# Patient Record
Sex: Male | Born: 1945 | Race: White | Hispanic: Refuse to answer | Marital: Married | Smoking: Current every day smoker
Health system: Southern US, Community
[De-identification: ages and names within clinical notes are randomized; demographics above are authoritative.]

## PROBLEM LIST (undated history)

## (undated) DIAGNOSIS — I4891 Unspecified atrial fibrillation: Secondary | ICD-10-CM

## (undated) DIAGNOSIS — H269 Unspecified cataract: Secondary | ICD-10-CM

## (undated) DIAGNOSIS — I10 Essential (primary) hypertension: Secondary | ICD-10-CM

## (undated) DIAGNOSIS — T7840XA Allergy, unspecified, initial encounter: Secondary | ICD-10-CM

## (undated) DIAGNOSIS — F1910 Other psychoactive substance abuse, uncomplicated: Secondary | ICD-10-CM

## (undated) DIAGNOSIS — I2699 Other pulmonary embolism without acute cor pulmonale: Secondary | ICD-10-CM

## (undated) DIAGNOSIS — Z860100 Personal history of colon polyps, unspecified: Secondary | ICD-10-CM

## (undated) DIAGNOSIS — C801 Malignant (primary) neoplasm, unspecified: Secondary | ICD-10-CM

## (undated) DIAGNOSIS — I82409 Acute embolism and thrombosis of unspecified deep veins of unspecified lower extremity: Secondary | ICD-10-CM

## (undated) DIAGNOSIS — F419 Anxiety disorder, unspecified: Secondary | ICD-10-CM

## (undated) DIAGNOSIS — F32A Depression, unspecified: Secondary | ICD-10-CM

## (undated) HISTORY — PX: TONSILLECTOMY: SUR1361

## (undated) HISTORY — DX: Personal history of colon polyps, unspecified: Z86.0100

## (undated) HISTORY — DX: Unspecified cataract: H26.9

## (undated) HISTORY — DX: Malignant (primary) neoplasm, unspecified: C80.1

## (undated) HISTORY — DX: Anxiety disorder, unspecified: F41.9

## (undated) HISTORY — PX: OTHER SURGICAL HISTORY: SHX169

## (undated) HISTORY — DX: Allergy, unspecified, initial encounter: T78.40XA

## (undated) HISTORY — DX: Acute embolism and thrombosis of unspecified deep veins of unspecified lower extremity: I82.409

## (undated) HISTORY — DX: Other psychoactive substance abuse, uncomplicated: F19.10

## (undated) HISTORY — PX: EYE SURGERY: SHX253

## (undated) HISTORY — PX: PROSTATECTOMY: SHX69

## (undated) HISTORY — DX: Personal history of colonic polyps: Z86.010

## (undated) HISTORY — DX: Depression, unspecified: F32.A

---

## 2005-12-26 DIAGNOSIS — Z86718 Personal history of other venous thrombosis and embolism: Secondary | ICD-10-CM | POA: Insufficient documentation

## 2012-07-05 DIAGNOSIS — M549 Dorsalgia, unspecified: Secondary | ICD-10-CM | POA: Insufficient documentation

## 2012-08-02 DIAGNOSIS — G629 Polyneuropathy, unspecified: Secondary | ICD-10-CM | POA: Insufficient documentation

## 2012-08-02 DIAGNOSIS — Z8546 Personal history of malignant neoplasm of prostate: Secondary | ICD-10-CM | POA: Insufficient documentation

## 2012-08-02 HISTORY — DX: Polyneuropathy, unspecified: G62.9

## 2015-03-03 LAB — HM HEPATITIS C SCREENING LAB: HM Hepatitis Screen: NEGATIVE

## 2017-06-06 LAB — HM COLONOSCOPY

## 2019-06-03 DIAGNOSIS — Z7901 Long term (current) use of anticoagulants: Secondary | ICD-10-CM | POA: Insufficient documentation

## 2019-07-02 DIAGNOSIS — F17200 Nicotine dependence, unspecified, uncomplicated: Secondary | ICD-10-CM

## 2019-07-02 HISTORY — DX: Nicotine dependence, unspecified, uncomplicated: F17.200

## 2019-07-22 DIAGNOSIS — Z7901 Long term (current) use of anticoagulants: Secondary | ICD-10-CM

## 2019-07-22 DIAGNOSIS — Z5181 Encounter for therapeutic drug level monitoring: Secondary | ICD-10-CM | POA: Insufficient documentation

## 2019-07-22 HISTORY — DX: Encounter for therapeutic drug level monitoring: Z79.01

## 2019-07-22 HISTORY — DX: Encounter for therapeutic drug level monitoring: Z51.81

## 2019-10-24 DIAGNOSIS — G4709 Other insomnia: Secondary | ICD-10-CM | POA: Insufficient documentation

## 2020-01-08 DIAGNOSIS — N200 Calculus of kidney: Secondary | ICD-10-CM | POA: Insufficient documentation

## 2020-01-08 HISTORY — DX: Calculus of kidney: N20.0

## 2020-09-22 DIAGNOSIS — I4891 Unspecified atrial fibrillation: Secondary | ICD-10-CM | POA: Insufficient documentation

## 2020-09-23 DIAGNOSIS — I4811 Longstanding persistent atrial fibrillation: Secondary | ICD-10-CM | POA: Insufficient documentation

## 2020-11-26 ENCOUNTER — Emergency Department (HOSPITAL_COMMUNITY)
Admission: EM | Admit: 2020-11-26 | Discharge: 2020-11-27 | Disposition: A | Discharge status: Home / Self Care | Payer: Medicare Other | Attending: Emergency Medicine | Admitting: Emergency Medicine

## 2020-11-26 ENCOUNTER — Encounter (HOSPITAL_COMMUNITY): Payer: Self-pay | Admitting: Emergency Medicine

## 2020-11-26 ENCOUNTER — Other Ambulatory Visit: Payer: Self-pay

## 2020-11-26 DIAGNOSIS — Z7901 Long term (current) use of anticoagulants: Secondary | ICD-10-CM | POA: Diagnosis not present

## 2020-11-26 DIAGNOSIS — Z86711 Personal history of pulmonary embolism: Secondary | ICD-10-CM | POA: Insufficient documentation

## 2020-11-26 DIAGNOSIS — R791 Abnormal coagulation profile: Secondary | ICD-10-CM | POA: Insufficient documentation

## 2020-11-26 DIAGNOSIS — I4891 Unspecified atrial fibrillation: Secondary | ICD-10-CM | POA: Diagnosis not present

## 2020-11-26 DIAGNOSIS — I10 Essential (primary) hypertension: Secondary | ICD-10-CM | POA: Diagnosis not present

## 2020-11-26 HISTORY — DX: Other pulmonary embolism without acute cor pulmonale: I26.99

## 2020-11-26 HISTORY — DX: Essential (primary) hypertension: I10

## 2020-11-26 HISTORY — DX: Unspecified atrial fibrillation: I48.91

## 2020-11-26 LAB — CBC WITH DIFFERENTIAL/PLATELET
Abs Immature Granulocytes: 0.06 10*3/uL (ref 0.00–0.07)
Basophils Absolute: 0.1 10*3/uL (ref 0.0–0.1)
Basophils Relative: 1 %
Eosinophils Absolute: 0.6 10*3/uL — ABNORMAL HIGH (ref 0.0–0.5)
Eosinophils Relative: 8 %
HCT: 43.5 % (ref 39.0–52.0)
Hemoglobin: 14.7 g/dL (ref 13.0–17.0)
Immature Granulocytes: 1 %
Lymphocytes Relative: 25 %
Lymphs Abs: 2.1 10*3/uL (ref 0.7–4.0)
MCH: 32 pg (ref 26.0–34.0)
MCHC: 33.8 g/dL (ref 30.0–36.0)
MCV: 94.6 fL (ref 80.0–100.0)
Monocytes Absolute: 1.3 10*3/uL — ABNORMAL HIGH (ref 0.1–1.0)
Monocytes Relative: 15 %
Neutro Abs: 4.3 10*3/uL (ref 1.7–7.7)
Neutrophils Relative %: 50 %
Platelets: 195 10*3/uL (ref 150–400)
RBC: 4.6 MIL/uL (ref 4.22–5.81)
RDW: 14.1 % (ref 11.5–15.5)
WBC: 8.4 10*3/uL (ref 4.0–10.5)
nRBC: 0 % (ref 0.0–0.2)

## 2020-11-26 LAB — COMPREHENSIVE METABOLIC PANEL
ALT: 23 U/L (ref 0–44)
AST: 25 U/L (ref 15–41)
Albumin: 3.8 g/dL (ref 3.5–5.0)
Alkaline Phosphatase: 52 U/L (ref 38–126)
Anion gap: 10 (ref 5–15)
BUN: 15 mg/dL (ref 8–23)
CO2: 24 mmol/L (ref 22–32)
Calcium: 9.2 mg/dL (ref 8.9–10.3)
Chloride: 104 mmol/L (ref 98–111)
Creatinine, Ser: 1.05 mg/dL (ref 0.61–1.24)
GFR, Estimated: >60 mL/min (ref >60)
Glucose, Bld: 84 mg/dL (ref 70–99)
Potassium: 4.6 mmol/L (ref 3.5–5.1)
Sodium: 138 mmol/L (ref 135–145)
Total Bilirubin: 0.6 mg/dL (ref 0.3–1.2)
Total Protein: 6.7 g/dL (ref 6.5–8.1)

## 2020-11-26 LAB — PROTIME-INR
INR: 1.1 (ref 0.8–1.2)
Prothrombin Time: 13.7 seconds (ref 11.4–15.2)

## 2020-11-26 NOTE — ED Triage Notes (Signed)
Patient reports elevated INR=14.5 result this morning .

## 2020-11-27 NOTE — ED Provider Notes (Signed)
[facility]ERGENCY DEPARTMENT Provider Note   CSN: [id number] Arrival date & time: 11/26/20  1839     History Chief Complaint  Patient presents with  . Abnormal Lab    INR=14.5    Larry Odonnell is a 74 y.o. male.  Patient has history of A. fib is on Coumadin.  His goal INR is 2.5-3.5 range.  Patient states that he usually does pretty well.  He takes his INR is with a home meter.  Has been noted for last couple months since he has moved away from his hematologist.  He states that this morning his INR was 13.9.  He states that he called his doctor's office and when they told him to recheck it.  He rechecked it was 14.5.  I told him the report to the emergency room.  Patient states that his INR was 3.7 a week ago.  Actually decreased his Coumadin and he also missed 2 doses since that time.  He has not changed his diet much.  No other symptoms.  No bleeding.  No trauma.   Abnormal Lab      Past Medical History:  Diagnosis Date  . A-fib ([place])   . Hypertension   . Pulmonary embolism ([facility])     There are no problems to display for this patient.   Past Surgical History:  Procedure Laterality Date  . PROSTATECTOMY    . TONSILLECTOMY         No family history on file.  Social History   Tobacco Use  . Smoking status: Never Smoker  . Smokeless tobacco: Never Used  Substance Use Topics  . Alcohol use: Never  . Drug use: Never    Home Medications Prior to Admission medications   Not on File    Allergies    Patient has no known allergies.  Review of Systems   Review of Systems  All other systems reviewed and are negative.   Physical Exam Updated Vital Signs BP (!) 156/76   Pulse 67   Temp 98.2 F (36.8 C)   Resp 18   Ht 6' (1.829 m)   Wt 95 kg   SpO2 98%   BMI 28.40 kg/m   Physical Exam Vitals and nursing note reviewed.  Constitutional:      Appearance: He is well-developed.  HENT:     Head: Normocephalic and atraumatic.      Mouth/Throat:     Mouth: Mucous membranes are moist.     Pharynx: Oropharynx is clear.  Eyes:     Pupils: Pupils are equal, round, and reactive to light.  Cardiovascular:     Rate and Rhythm: Normal rate.  Pulmonary:     Effort: Pulmonary effort is normal. No respiratory distress.  Abdominal:     General: Abdomen is flat. There is no distension.  Musculoskeletal:        General: Normal range of motion.     Cervical back: Normal range of motion.  Skin:    General: Skin is warm and dry.  Neurological:     General: No focal deficit present.     Mental Status: He is alert.     ED Results / Procedures / Treatments   Labs (all labs ordered are listed, but only abnormal results are displayed) Labs Reviewed  CBC WITH DIFFERENTIAL/PLATELET - Abnormal; Notable for the following components:      Result Value   Monocytes Absolute 1.3 (*)    Eosinophils Absolute 0.6 (*)  All other components within normal limits  COMPREHENSIVE METABOLIC PANEL  PROTIME-INR    EKG None  Radiology No results found.  Procedures Procedures (including critical care time)  Medications Ordered in ED Medications - No data to display  ED Course  I have reviewed the triage vital signs and the nursing notes.  Pertinent labs & imaging results that were available during my care of the patient were reviewed by me and considered in my medical decision making (see chart for details).    MDM Rules/Calculators/A&P                          INR low. Suspect machine malfunction. Will d/w hematologist tomorrow.   Final Clinical Impression(s) / ED Diagnoses Final diagnoses:  Subtherapeutic international normalized ratio (INR)    Rx / DC Orders ED Discharge Orders    None       Nevaeh Casillas, Corene Cornea, MD 11/27/20 [phone]

## 2020-11-27 NOTE — Discharge Instructions (Addendum)
Your INR here was 1.1. I would call your prescriber and ask what they would like you to do next as this is too low for your condition.

## 2021-01-25 ENCOUNTER — Telehealth: Payer: Self-pay

## 2021-01-25 NOTE — Telephone Encounter (Signed)
NOTES ON FILE FROM  [facility] [id number], SENT REFERRAL TO SCHEDULING

## 2021-02-28 NOTE — Progress Notes (Unsigned)
Cardiology Office Note:    Date:  03/02/2021   ID:  Larry Odonnell, DOB Dec 22, 1946, MRN [id number]  PCP:  Genevive Bi, [place]  Cardiologist:  No primary care provider on file.  Advanced Practice Provider:  No care team member to display Electrophysiologist:  None    Referring MD: Loretha Brasil*     History of Present Illness:    Larry Odonnell is a 75 y.o. male with a hx of Afib, DVT in 2008 and PE in 2020, and HTN who was referred by [facility] for further management of Afib and DVT/PE.  Patient initially presented in 2008 with RLE redness, swelling and pain. Doppler confirmed the presence of a  DVT. Evaluation suggested an idiopathic event and long-term warfarin was recommended. He ultimately decided to come off anticoagulation. He was doing well  until May 2020 when he was diagnosed with bilateral pulmonary embolism.  Specifically, he presented to [facility] urgent care on 05/22/2019 with left-sided chest pain radiating to the back and fever. He also noted progressive DOE. Chest x-ray was unremarkable but d-dimer was markedly elevated. He was referred to the ER to evaluate for pulmonary embolism. At [facility], CT PE revealed bilateral pulmonary embolism, predominantly in the lower lobe distribution. He was admitted 5/27-5/28/2020 for observation and discharged on apixaban 10mg  twice daily followed by 5mg  BID. He was advised to stop taking meloxicam, which he used for chronic hip pain. He had a very large out-of-pocket cost with apixaban and opted to switch to warfarin. Has been followed by Heme.  The patient just moved to to the area in October from [place]. Currently living [facility] area. Has been watching his INR on his home monitor and has been running 2-3 since January. Currently on 6mg  4x/week and 5mg  3x/week. Patient feels overall well. No bleeding issues on warfarin.   He also states that he was diagnosed with Afib last September  2021. States his only symptom at that time was that he noticed a rapid pulse rate and some fatigue. No palpitations, chest pain or SOB. Saw Cardiologist at [facility] and was started on metoprolol and amiodarone. Returned to rhythm prior DCCV  Has remained on amiodarone since that time. No recurrence of Afib since November that he knows of. Has not had an echo that I can see in the system. Notably underwent myoview stress test for DOE which was negative for ischemia but showed small, fixed inferior wall defect (possible attenuation) and mildly reduced LVEF.  Denies chest pain, palpitations, LE edema, orthopnea or PND.   Family history: Mother-Atrial fibrillation   Past Medical History:  Diagnosis Date  . A-fib ([place])   . DVT (deep venous thrombosis) ([place])   . Hypertension   . Pulmonary embolism [facility])     Past Surgical History:  Procedure Laterality Date  . PROSTATECTOMY    . TONSILLECTOMY      Current Medications: Current Meds  Medication Sig  . amiodarone (PACERONE) 200 MG tablet Take 1 tablet by mouth daily. Taking 100 mg daily  . amLODipine (NORVASC) 5 MG tablet Take by mouth.  . clonazePAM (KLONOPIN) 0.5 MG tablet Take by mouth.  Marland Kitchen lisinopril (ZESTRIL) 10 MG tablet Take by mouth.  . Melatonin 10 MG TABS Take by mouth.  . metoprolol tartrate (LOPRESSOR) 25 MG tablet Take by mouth.  . Multiple Vitamin (MULTI-VITAMIN) tablet Take 1 tablet by mouth daily.  . Omega-3 1000 MG CAPS Take by mouth.  . venlafaxine  XR (EFFEXOR-XR) 150 MG 24 hr capsule Take by mouth.  . warfarin (COUMADIN) 1 MG tablet Take by mouth.  . warfarin (COUMADIN) 5 MG tablet Take by mouth.     Allergies:   Latex and Oxycodone   Social History   Socioeconomic History  . Marital status: Married    Spouse name: Not on file  . Number of children: Not on file  . Years of education: Not on file  . Highest education level: Not on file  Occupational History  . Not on file  Tobacco Use  . Smoking status: Never  Smoker  . Smokeless tobacco: Never Used  Substance and Sexual Activity  . Alcohol use: Never  . Drug use: Never  . Sexual activity: Not on file  Other Topics Concern  . Not on file  Social History Narrative  . Not on file   Social Determinants of Health   Financial Resource Strain: Not on file  Food Insecurity: Not on file  Transportation Needs: Not on file  Physical Activity: Not on file  Stress: Not on file  Social Connections: Not on file     Family History: Mother with Afib  ROS:   Please see the history of present illness.    Review of Systems  Constitutional: Negative for chills and fever.  HENT: Negative for hearing loss and sore throat.   Eyes: Negative for blurred vision and redness.  Respiratory: Positive for shortness of breath.   Cardiovascular: Positive for claudication. Negative for chest pain, palpitations, orthopnea, leg swelling and PND.  Gastrointestinal: Negative for blood in stool, melena, nausea and vomiting.  Genitourinary: Negative for hematuria.  Musculoskeletal: Positive for joint pain.  Neurological: Negative for dizziness and loss of consciousness.  Endo/Heme/Allergies: Negative for polydipsia.  Psychiatric/Behavioral: Negative for depression.    EKGs/Labs/Other Studies Reviewed:    The following studies were reviewed today: Myoview 10/2020: IMPRESSIONS & RECOMMENDATIONS There is a small, fixed inferior wall defect of mild severity, without definite evidence of ischemia. Diaphragmatic attenuation artifact present. Atrial fibrillation present throughout study Recommend clinical correlation. Cardiac History: Hypertension, hyperlipidemia, tobacco abuse, A-Fib, DVT, PE. No previous studies.  LV EF & Wall Motion: Mildly dilated LV size with mild LV systolic dysfunction, with mild global hypokinesis.  EKG:  EKG is  ordered today.  The ekg ordered today demonstrates NSR with HR 66  Recent Labs: 11/26/2020: ALT 23; BUN 15; Creatinine, Ser  1.05; Hemoglobin 14.7; Platelets 195; Potassium 4.6; Sodium 138  Recent Lipid Panel No results found for: CHOL, TRIG, HDL, CHOLHDL, VLDL, LDLCALC, LDLDIRECT   Risk Assessment/Calculations:    CHA2DS2-VASc Score = 3  This indicates a 3.2% annual risk of stroke. The patient's score is based upon: CHF History: No HTN History: Yes Diabetes History: No Stroke History: No Vascular Disease History: Yes Age Score: 1 Gender Score: 0   Physical Exam:    VS:  BP (!) 160/80   Pulse 75   Ht 6' (1.829 m)   Wt 206 lb 3.2 oz (93.5 kg)   SpO2 97%   BMI 27.97 kg/m     Wt Readings from Last 3 Encounters:  03/02/21 206 lb 3.2 oz (93.5 kg)  11/26/20 209 lb 7 oz (95 kg)     GEN:  Well nourished, well developed in no acute distress HEENT: Normal NECK: No JVD; No carotid bruits CARDIAC: RRR, no murmurs, rubs, gallops RESPIRATORY:  Clear to auscultation without rales, wheezing or rhonchi  ABDOMEN: Soft, non-tender, non-distended MUSCULOSKELETAL:  No edema;  No deformity  SKIN: Warm and dry NEUROLOGIC:  Alert and oriented x 3 PSYCHIATRIC:  Normal affect   ASSESSMENT:    1. Atrial fibrillation, unspecified type ([place])   2. 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 Other chronic pulmonary embolism, unspecified whether acute cor pulmonale present ([place])   4. Primary hypertension   5. Dyspnea on exertion    PLAN:    In order of problems listed above:  #Parosyxmal Afib: Previously followed by [facility] cardiology. CHADs-vasc 3. On warfarin for AC and metoprolol and amiodarone for rate control. Currently in rhythm today and believes he has not had recurrence of Afib since 10/2020. Was planned for DCCV at [facility] but converted prior to the procedure. Has not had an echo. 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 Has remained active and walks without issues. Notably has started smoking again which may be contributing. Myoview in 10/2020 with no ischemia; possible infarct vs attenuation defect inferiorly. EF on myoview mildly reduced. -Myoview at OSH negative for ischemia -Check TTE as above -Smoking cessation counseling provided  #Bilateral LE Pain: Patient with bilateral lower extremity achiness that has been ongoing for several months. Atypical in nature and does not seem to worsen with exercise but he notes his legs feel more faigued and weak with walking. Given history smoking and known risk factors, will check ABIs given concern for claudication. -Check bilateral ABIs  #HTN: Elevated today but patient reports white coat syndrome. Has recently stopped taking his amlodipine and lisinopril as prescribed and changed to every other day dosing as he thinks his blood pressure has not been that bad at home. WE discussed that he needs daily dosing of medication to ensure he is covered appropriately for 24 hours as every other day dosing leads to significant blood pressure swings. He is amenable to this and will follow-up with PCP. -Continue amlopdine 5mg  daily -Continue lisinopril 10mg  daily -Encouraged compliance -Will follow-up with PCP  #Tobacco Use: -Tobacco cessation counseling    Medication Adjustments/Labs and Tests Ordered: Current medicines are reviewed at length with the patient today.  Concerns regarding medicines are outlined above.  Orders Placed This Encounter  Procedures  . EKG 12-Lead  . ECHOCARDIOGRAM COMPLETE  . VAS [place]  ABI WITH/WO TBI   No orders of the defined types were placed in this encounter.   Patient Instructions  Medication Instructions:  Your physician recommends that you continue on your current medications as directed. Please refer to the Current Medication list given to you today. *If you need a refill on your cardiac medications before your next appointment, please call your pharmacy*   Lab Work: None If you have labs (blood work) drawn today and your tests are completely normal, you will receive your results only by: Marland Kitchen MyChart Message (if you have MyChart) OR . A paper copy in the mail If you have any lab test that is abnormal or we need to change your treatment, we will call you to review  the results.   Testing/Procedures: Your physician has requested that you have an echocardiogram. Echocardiography is a painless test that uses sound waves to create images of your heart. It provides your doctor with information about the size and shape of your heart and how well your heart's chambers and valves are working. This procedure takes approximately one hour. There are no restrictions for this procedure.  Your physician has requested that you have an ankle brachial index (ABI). During this test an ultrasound and blood pressure cuff are used to evaluate the arteries that supply the arms and legs with blood. Allow thirty minutes for this exam. There are no restrictions or special instructions.   Follow-Up: At [facility], you and your health needs are our priority.  As part of our continuing mission to provide you with exceptional heart care, we have created designated Provider Care Teams.  These Care Teams include your primary Cardiologist (physician) and Advanced Practice Providers (APPs -  Physician Assistants and Nurse Practitioners) who all work together to provide you with the care you need, when you need it.  We recommend signing up for the patient portal called "MyChart".  Sign up  information is provided on this After Visit Summary.  MyChart is used to connect with patients for Virtual Visits (Telemedicine).  Patients are able to view lab/test results, encounter notes, upcoming appointments, etc.  Non-urgent messages can be sent to your provider as well.   To learn more about what you can do with MyChart, go to [email or web address].    Your next appointment:   6 week(s)  The format for your next appointment:   Dr. Gwyndolyn Kaufman  Provider:           Signed, Freada Bergeron, MD  03/02/2021 10:38 AM    [place]

## 2021-03-02 ENCOUNTER — Ambulatory Visit (INDEPENDENT_AMBULATORY_CARE_PROVIDER_SITE_OTHER): Payer: Medicare Other | Admitting: Cardiology

## 2021-03-02 ENCOUNTER — Encounter: Payer: Self-pay | Admitting: Cardiology

## 2021-03-02 ENCOUNTER — Other Ambulatory Visit: Payer: Self-pay

## 2021-03-02 VITALS — BP 160/80 | HR 75 | Ht 72.0 in | Wt 206.2 lb

## 2021-03-02 DIAGNOSIS — I739 Peripheral vascular disease, unspecified: Secondary | ICD-10-CM

## 2021-03-02 DIAGNOSIS — I2782 Chronic pulmonary embolism: Secondary | ICD-10-CM | POA: Diagnosis not present

## 2021-03-02 DIAGNOSIS — I4891 Unspecified atrial fibrillation: Secondary | ICD-10-CM | POA: Diagnosis not present

## 2021-03-02 DIAGNOSIS — I10 Essential (primary) hypertension: Secondary | ICD-10-CM

## 2021-03-02 DIAGNOSIS — R0609 Other forms of dyspnea: Secondary | ICD-10-CM

## 2021-03-02 DIAGNOSIS — R0600 Dyspnea, unspecified: Secondary | ICD-10-CM

## 2021-03-02 NOTE — Patient Instructions (Addendum)
Medication Instructions:  Your physician recommends that you continue on your current medications as directed. Please refer to the Current Medication list given to you today. *If you need a refill on your cardiac medications before your next appointment, please call your pharmacy*   Lab Work: None If you have labs (blood work) drawn today and your tests are completely normal, you will receive your results only by: Marland Kitchen MyChart Message (if you have MyChart) OR . A paper copy in the mail If you have any lab test that is abnormal or we need to change your treatment, we will call you to review the results.   Testing/Procedures: Your physician has requested that you have an echocardiogram. Echocardiography is a painless test that uses sound waves to create images of your heart. It provides your doctor with information about the size and shape of your heart and how well your heart's chambers and valves are working. This procedure takes approximately one hour. There are no restrictions for this procedure.  Your physician has requested that you have an ankle brachial index (ABI). During this test an ultrasound and blood pressure cuff are used to evaluate the arteries that supply the arms and legs with blood. Allow thirty minutes for this exam. There are no restrictions or special instructions.   Follow-Up: At [facility], you and your health needs are our priority.  As part of our continuing mission to provide you with exceptional heart care, we have created designated Provider Care Teams.  These Care Teams include your primary Cardiologist (physician) and Advanced Practice Providers (APPs -  Physician Assistants and Nurse Practitioners) who all work together to provide you with the care you need, when you need it.  We recommend signing up for the patient portal called "MyChart".  Sign up information is provided on this After Visit Summary.  MyChart is used to connect with patients for Virtual Visits  (Telemedicine).  Patients are able to view lab/test results, encounter notes, upcoming appointments, etc.  Non-urgent messages can be sent to your provider as well.   To learn more about what you can do with MyChart, go to [email or web address].    Your next appointment:   6 week(s)  The format for your next appointment:   Dr. Gwyndolyn Kaufman  Provider:

## 2021-03-04 ENCOUNTER — Other Ambulatory Visit: Payer: Self-pay | Admitting: Cardiology

## 2021-03-04 ENCOUNTER — Other Ambulatory Visit (HOSPITAL_COMMUNITY): Payer: Self-pay | Admitting: Cardiology

## 2021-03-04 DIAGNOSIS — M79604 Pain in right leg: Secondary | ICD-10-CM

## 2021-03-08 ENCOUNTER — Other Ambulatory Visit: Payer: Self-pay | Admitting: Cardiology

## 2021-03-08 DIAGNOSIS — I739 Peripheral vascular disease, unspecified: Secondary | ICD-10-CM

## 2021-03-08 DIAGNOSIS — M79605 Pain in left leg: Secondary | ICD-10-CM

## 2021-03-08 DIAGNOSIS — M79604 Pain in right leg: Secondary | ICD-10-CM

## 2021-03-09 ENCOUNTER — Other Ambulatory Visit: Payer: Self-pay

## 2021-03-09 ENCOUNTER — Ambulatory Visit (HOSPITAL_COMMUNITY)
Admission: RE | Admit: 2021-03-09 | Discharge: 2021-03-09 | Disposition: A | Discharge status: Home / Self Care | Payer: Medicare Other | Source: Ambulatory Visit | Attending: Cardiovascular Disease | Admitting: Cardiovascular Disease

## 2021-03-09 DIAGNOSIS — I739 Peripheral vascular disease, unspecified: Secondary | ICD-10-CM | POA: Insufficient documentation

## 2021-03-09 DIAGNOSIS — M79604 Pain in right leg: Secondary | ICD-10-CM | POA: Diagnosis present

## 2021-03-09 DIAGNOSIS — M79605 Pain in left leg: Secondary | ICD-10-CM | POA: Diagnosis present

## 2021-03-23 ENCOUNTER — Ambulatory Visit (INDEPENDENT_AMBULATORY_CARE_PROVIDER_SITE_OTHER): Payer: Medicare Other | Admitting: *Deleted

## 2021-03-23 ENCOUNTER — Other Ambulatory Visit: Payer: Self-pay

## 2021-03-23 DIAGNOSIS — Z5181 Encounter for therapeutic drug level monitoring: Secondary | ICD-10-CM | POA: Diagnosis not present

## 2021-03-23 DIAGNOSIS — I824Y9 Acute embolism and thrombosis of unspecified deep veins of unspecified proximal lower extremity: Secondary | ICD-10-CM

## 2021-03-23 DIAGNOSIS — I4891 Unspecified atrial fibrillation: Secondary | ICD-10-CM | POA: Diagnosis not present

## 2021-03-23 DIAGNOSIS — I2693 Single subsegmental pulmonary embolism without acute cor pulmonale: Secondary | ICD-10-CM

## 2021-03-23 DIAGNOSIS — I2699 Other pulmonary embolism without acute cor pulmonale: Secondary | ICD-10-CM | POA: Insufficient documentation

## 2021-03-23 LAB — POCT INR: INR: 2.5 (ref 2.0–3.0)

## 2021-03-23 NOTE — Patient Instructions (Addendum)
Description   Continue taking Warfarin 6mg  daily except 5mg  on Monday, Wednesday, and Friday. Recheck INR in 3 weeks. Anticoagulation Clinic [phone]    A full discussion of the nature of anticoagulants has been carried out.  A benefit risk analysis has been presented to the patient, so that they understand the justification for choosing anticoagulation at this time. The need for frequent and regular monitoring, precise dosage adjustment and compliance is stressed.  Side effects of potential bleeding are discussed.  The patient should avoid any OTC items containing aspirin or ibuprofen, and should avoid great swings in general diet.  Avoid alcohol consumption.  Call if any signs of abnormal bleeding.

## 2021-03-24 ENCOUNTER — Other Ambulatory Visit (HOSPITAL_COMMUNITY): Payer: Medicare Other

## 2021-03-25 ENCOUNTER — Other Ambulatory Visit: Payer: Self-pay

## 2021-03-25 ENCOUNTER — Ambulatory Visit (HOSPITAL_COMMUNITY): Payer: Medicare Other | Attending: Cardiovascular Disease

## 2021-03-25 DIAGNOSIS — I4891 Unspecified atrial fibrillation: Secondary | ICD-10-CM

## 2021-03-25 LAB — ECHOCARDIOGRAM COMPLETE
Area-P 1/2: 2.58 cm2
S' Lateral: 3.65 cm

## 2021-03-26 NOTE — Telephone Encounter (Signed)
General FYI sent from the pt to Dr. Johney Frame.  Pt will see Dr. Johney Frame in the office on 4/19 at [place].  Will close this encounter.

## 2021-04-07 NOTE — Progress Notes (Deleted)
Cardiology Office Note:    Date:  04/07/2021   ID:  Larry Odonnell, DOB 12/06/1946, MRN [id number]  PCP:  Larry Odonnell, Energy  Cardiologist:  No primary care provider on file.  Advanced Practice Provider:  No care team member to display Electrophysiologist:  None   Referring MD: Larry Bi, MD    History of Present Illness:    Larry Odonnell is a 75 y.o. 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  Patient initially presented in 2008 with RLE redness, swelling and pain. Doppler confirmed the presence of a  DVT. Evaluation suggested an idiopathic event and long-term warfarin was recommended. He ultimately decided to come off anticoagulation. He was doing well  until May 2020 when he was diagnosed with bilateral pulmonary embolism.  Specifically, he presented to [facility] urgent care on 05/22/2019 with left-sided chest pain radiating to the back and fever. He also noted progressive DOE. Chest x-ray was unremarkable but d-dimer was markedly elevated. He was referred to the ER to evaluate for pulmonary embolism. At [facility], CT PE revealed bilateral pulmonary embolism, predominantly in the lower lobe distribution. He was admitted 5/27-5/28/2020 for observation and discharged on apixaban 10mg  twice daily followed by 5mg  BID. He was advised to stop taking meloxicam, which he used for chronic hip pain. He had a very large out-of-pocket cost with apixaban and opted to switch to warfarin. Has been followed by Heme.  He was diagnosed with Afib last September 2021. States his only symptom at that time was that he noticed a rapid pulse rate and some fatigue. No palpitations, chest pain or SOB. Saw Cardiologist at [facility] and was started on metoprolol and amiodarone. Returned to rhythm prior DCCV  Has remained on amiodarone since that time. No recurrence of Afib since November that he knows of.  Notably underwent myoview  stress test for DOE which was negative for ischemia but showed small, fixed inferior wall defect (possible attenuation) and mildly reduced LVEF.  Since our last visit on 03/02/21, the patient underwent TTE whoch showed normal LVEF, no significant valve disease   Past Medical History:  Diagnosis Date  . A-fib ([place])   . DVT (deep venous thrombosis) ([place])   . Hypertension   . Pulmonary embolism [facility])     Past Surgical History:  Procedure Laterality Date  . PROSTATECTOMY    . TONSILLECTOMY      Current Medications: No outpatient medications have been marked as taking for the 04/13/21 encounter (Appointment) with Freada Bergeron, MD.     Allergies:   Latex and Oxycodone   Social History   Socioeconomic History  . Marital status: Married    Spouse name: Not on file  . Number of children: Not on file  . Years of education: Not on file  . Highest education level: Not on file  Occupational History  . Not on file  Tobacco Use  . Smoking status: Never Smoker  . Smokeless tobacco: Never Used  Substance and Sexual Activity  . Alcohol use: Never  . Drug use: Never  . Sexual activity: Not on file  Other Topics Concern  . Not on file  Social History Narrative  . Not on file   Social Determinants of Health   Financial Resource Strain: Not on file  Food Insecurity: Not on file  Transportation Needs: Not on file  Physical Activity: Not on file  Stress: Not on file  Social Connections: Not on file     Family History: The patient's ***family history is not on file.  ROS:   Please see the history of present illness.    *** All other systems reviewed and are negative.  EKGs/Labs/Other Studies Reviewed:    The following studies were reviewed today: TTE 04/15/2021: IMPRESSIONS    1. Left ventricular ejection fraction, by estimation, is 55 to 60%. Left  ventricular ejection fraction by 3D volume is 58 %. The left ventricle has  normal function. The left ventricle has  no regional wall motion  abnormalities. Left ventricular diastolic  parameters were normal.  2. Right ventricular systolic function is normal. The right ventricular  size is normal. There is normal pulmonary artery systolic pressure. The  estimated right ventricular systolic pressure is 57.8 mmHg.  3. The mitral valve is grossly normal. Trivial mitral valve  regurgitation. No evidence of mitral stenosis.  4. The aortic valve is tricuspid. Aortic valve regurgitation is not  visualized. No aortic stenosis is present.  5. The inferior vena cava is normal in size with greater than 50%  respiratory variability, suggesting right atrial pressure of 3 mmHg.  Myoview 10/2020: IMPRESSIONS & RECOMMENDATIONS There is a small, fixed inferior wall defect of mild severity, without definite evidence of ischemia. Diaphragmatic attenuation artifact present. Atrial fibrillation present throughout study Recommend clinical correlation. Cardiac History: Hypertension, hyperlipidemia, tobacco abuse, A-Fib, DVT, PE. No previous studies.  LV EF & Wall Motion: Mildly dilated LV size with mild LV systolic dysfunction, with mild global hypokinesis.  ABI 03/09/21: Summary:  Right: Resting right ankle-brachial index is within normal range. No  evidence of significant right lower extremity arterial disease. The right  toe-brachial index is normal.   Left: Resting left ankle-brachial index is within normal range. No  evidence of significant left lower extremity arterial disease. The left  toe-brachial index is normal.   EKG:  EKG is *** ordered today.  The ekg ordered today demonstrates ***  Recent Labs: 11/26/2020: ALT 23; BUN 15; Creatinine, Ser 1.05; Hemoglobin 14.7; Platelets 195; Potassium 4.6; Sodium 138  Recent Lipid Panel No results found for: CHOL, TRIG, HDL, CHOLHDL, VLDL, LDLCALC, LDLDIRECT   Risk Assessment/Calculations:   {Does this patient have ATRIAL  FIBRILLATION?:[phone]}   Physical Exam:    VS:  There were no vitals taken for this visit.    Wt Readings from Last 3 Encounters:  03/02/21 206 lb 3.2 oz (93.5 kg)  11/26/20 209 lb 7 oz (95 kg)     GEN: *** Well nourished, well developed in no acute distress HEENT: Normal NECK: No JVD; No carotid bruits LYMPHATICS: No lymphadenopathy CARDIAC: ***RRR, no murmurs, rubs, gallops RESPIRATORY:  Clear to auscultation without rales, wheezing or rhonchi  ABDOMEN: Soft, non-tender, non-distended MUSCULOSKELETAL:  No edema; No deformity  SKIN: Warm and dry NEUROLOGIC:  Alert and oriented x 3 PSYCHIATRIC:  Normal affect   ASSESSMENT:    No diagnosis found. PLAN:    In order of problems listed above:  #Parosyxmal Afib: Previously followed by [facility] cardiology. CHADs-vasc 3. On warfarin for AC and metoprolol and amiodarone for rate control. Minimally symptomatic while in Afib. Currently in rhythm today and believes he has not had recurrence of Afib since 10/2020. Was planned for DCCV at [facility] but converted prior to the procedure. 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 Has remained active and walks without issues. Notably has started smoking again which may be contributing. Myoview in 10/2020 with no ischemia; possible infarct vs attenuation defect inferiorly. Normal EF on TTE with no valve abnormalities -Myoview at OSH negative for ischemia -TTE with normal BiV function, no significant valve disease -Smoking cessation counseling provided  #Bilateral LE  Pain: Patient with bilateral lower extremity achiness that has been ongoing for several months. Atypical in nature and does not seem to worsen with exercise but he notes his legs feel more faigued and weak with walking. ABI negative for obstruction -ABIs negative -Continue exercise  #HTN: -Continue amlopdine 5mg  daily -Continue lisinopril 10mg  daily -Encouraged compliance -Will follow-up with PCP  #Tobacco Use: -Tobacco cessation counseling   {Are you ordering a CV Procedure (e.g. stress test, cath, DCCV, TEE, etc)?   Press F2        :[id number]}    Medication Adjustments/Labs and Tests Ordered: Current medicines are reviewed at length with the patient today.  Concerns regarding medicines are outlined above.  No orders of the defined types were placed in this encounter.  No orders of the defined types were placed in this encounter.   There are no Patient Instructions on file for this visit.   Signed, Freada Bergeron, MD  04/07/2021 8:16 PM    [facility]

## 2021-04-13 ENCOUNTER — Ambulatory Visit (INDEPENDENT_AMBULATORY_CARE_PROVIDER_SITE_OTHER): Payer: Medicare Other | Admitting: Cardiology

## 2021-04-13 ENCOUNTER — Other Ambulatory Visit: Payer: Self-pay

## 2021-04-13 ENCOUNTER — Ambulatory Visit (INDEPENDENT_AMBULATORY_CARE_PROVIDER_SITE_OTHER): Payer: Medicare Other | Admitting: *Deleted

## 2021-04-13 ENCOUNTER — Encounter: Payer: Self-pay | Admitting: Cardiology

## 2021-04-13 VITALS — BP 118/62 | HR 56 | Ht 72.0 in | Wt 206.4 lb

## 2021-04-13 DIAGNOSIS — I824Y9 Acute embolism and thrombosis of unspecified deep veins of unspecified proximal lower extremity: Secondary | ICD-10-CM

## 2021-04-13 DIAGNOSIS — I2699 Other pulmonary embolism without acute cor pulmonale: Secondary | ICD-10-CM

## 2021-04-13 DIAGNOSIS — Z5181 Encounter for therapeutic drug level monitoring: Secondary | ICD-10-CM | POA: Diagnosis not present

## 2021-04-13 DIAGNOSIS — I4891 Unspecified atrial fibrillation: Secondary | ICD-10-CM | POA: Diagnosis not present

## 2021-04-13 DIAGNOSIS — M79604 Pain in right leg: Secondary | ICD-10-CM

## 2021-04-13 DIAGNOSIS — I10 Essential (primary) hypertension: Secondary | ICD-10-CM

## 2021-04-13 DIAGNOSIS — I825Z9 Chronic embolism and thrombosis of unspecified deep veins of unspecified distal lower extremity: Secondary | ICD-10-CM

## 2021-04-13 DIAGNOSIS — R0600 Dyspnea, unspecified: Secondary | ICD-10-CM

## 2021-04-13 DIAGNOSIS — M79605 Pain in left leg: Secondary | ICD-10-CM

## 2021-04-13 DIAGNOSIS — Z79899 Other long term (current) drug therapy: Secondary | ICD-10-CM | POA: Diagnosis not present

## 2021-04-13 DIAGNOSIS — E785 Hyperlipidemia, unspecified: Secondary | ICD-10-CM | POA: Diagnosis not present

## 2021-04-13 DIAGNOSIS — Z86711 Personal history of pulmonary embolism: Secondary | ICD-10-CM

## 2021-04-13 DIAGNOSIS — R0609 Other forms of dyspnea: Secondary | ICD-10-CM

## 2021-04-13 LAB — POCT INR: INR: 1.3 — AB (ref 2.0–3.0)

## 2021-04-13 MED ORDER — METOPROLOL SUCCINATE ER 25 MG PO TB24: 25.0000 mg | ORAL_TABLET | Freq: Every day | ORAL | 2 refills | Status: DC

## 2021-04-13 NOTE — Patient Instructions (Signed)
Medication Instructions:   STOP TAKING AMIODARONE NOW  STOP TAKING METOPROLOL TARTRATE NOW  START TAKING METOPROLOL SUCCINATE (TOPROL XL) 25 MG BY MOUTH DAILY  *If you need a refill on your cardiac medications before your next appointment, please call your pharmacy*   Lab Work:  IN ONE WEEK--WILL CHECK TSH, LFTs, AND LIPIDS AT THAT TIME--COME FASTING TO THIS LAB APPOINTMENT  If you have labs (blood work) drawn today and your tests are completely normal, you will receive your results only by: Marland Kitchen MyChart Message (if you have MyChart) OR . A paper copy in the mail If you have any lab test that is abnormal or we need to change your treatment, we will call you to review the results.   Follow-Up: At [facility], you and your health needs are our priority.  As part of our continuing mission to provide you with exceptional heart care, we have created designated Provider Care Teams.  These Care Teams include your primary Cardiologist (physician) and Advanced Practice Providers (APPs -  Physician Assistants and Nurse Practitioners) who all work together to provide you with the care you need, when you need it.  We recommend signing up for the patient portal called "MyChart".  Sign up information is provided on this After Visit Summary.  MyChart is used to connect with patients for Virtual Visits (Telemedicine).  Patients are able to view lab/test results, encounter notes, upcoming appointments, etc.  Non-urgent messages can be sent to your provider as well.   To learn more about what you can do with MyChart, go to [email or web address].    Your next appointment:   6 month(s)  The format for your next appointment:   In Person  Provider:   You will see one of the following Advanced Practice Providers on your designated Care Team:    Richardson Dopp, PA-C  [place], [place]

## 2021-04-13 NOTE — Patient Instructions (Addendum)
Description   Today take 7mg  and tomorrow take 6mg  then continue taking Warfarin 6mg  daily except 5mg  on Monday, Wednesday, and Friday. Recheck INR in 1 week (normally 3 weeks). Amio d/c'd today (04/13/21) at [place] visit.  Anticoagulation Clinic [phone]

## 2021-04-13 NOTE — Progress Notes (Signed)
Cardiology Office Note:    Date:  04/13/2021   ID:  Larry Odonnell, DOB August 10, 1946, MRN [id number]  PCP:  Genevive Bi, [place]  Cardiologist:  No primary care provider on file.  Advanced Practice Provider:  No care team member to display Electrophysiologist:  None   Referring MD: Genevive Bi, MD    History of Present Illness:    Larry Odonnell is a 75 y.o. 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  Patient initially presented in 2008 with RLE redness, swelling and pain. Doppler confirmed the presence of a  DVT. Evaluation suggested an idiopathic event and long-term warfarin was recommended. He ultimately decided to come off anticoagulation. He was doing well  until May 2020 when he was diagnosed with bilateral pulmonary embolism.  Specifically, he presented to [facility] urgent care on 05/22/2019 with left-sided chest pain radiating to the back and fever. He also noted progressive DOE. Chest x-ray was unremarkable but d-dimer was markedly elevated. He was referred to the ER to evaluate for pulmonary embolism. At [facility], CT PE revealed bilateral pulmonary embolism, predominantly in the lower lobe distribution. He was admitted 5/27-5/28/2020 for observation and discharged on apixaban 10mg  twice daily followed by 5mg  BID. He was advised to stop taking meloxicam, which he used for chronic hip pain. He had a very large out-of-pocket cost with apixaban and opted to switch to warfarin. Has been followed by Heme.  He was diagnosed with Afib last September 2021. States his only symptom at that time was that he noticed a rapid pulse rate and some fatigue. No palpitations, chest pain or SOB. Saw Cardiologist at [facility] and was started on metoprolol and amiodarone. Returned to rhythm prior DCCV  Has remained on amiodarone since that time. No recurrence of Afib since November that he knows of.  Notably underwent myoview  stress test for DOE which was negative for ischemia but showed small, fixed inferior wall defect (possible attenuation) and mildly reduced LVEF.  Since our last visit on 03/02/21, the patient underwent TTE whoch showed normal LVEF, no significant valve disease  Today, he reports doing well on the metoprolol. He is currently tolerating the warfarin. However, he has had some bruising but denies any bleeding. He states he works outside in the yard and has no exertional symptoms.  After 20 minutes, he states that he needs to sit down and rest, but does not attribute this to shortness of breath. He does not participate in formal exercise.  Previously, he was in A-fib for about a week before having this checked, because he did not experience any symptoms during this time (September 2021).  He reports that he tries to check his heart rate at home. He is not currently fasting.  He smokes cigarettes, about 1/2 pack per day. He states he knows this is not good but he also smokes due to stress. His mother has A-fib as well.    Past Medical History:  Diagnosis Date  . A-fib ([place])   . DVT (deep venous thrombosis) ([place])   . Hypertension   . Pulmonary embolism [facility])     Past Surgical History:  Procedure Laterality Date  . PROSTATECTOMY    . TONSILLECTOMY      Current Medications: Current Meds  Medication Sig  . amLODipine (NORVASC) 5 MG tablet Take by mouth.  . Cholecalciferol (VITAMIN D3) 1.25 MG (50000 UT) CAPS  Take 1 capsule by mouth daily.  . clonazePAM (KLONOPIN) 0.5 MG tablet 0.5 mg as needed for anxiety.  Marland Kitchen lisinopril (ZESTRIL) 10 MG tablet Take 10 mg by mouth daily.  . Melatonin 10 MG TABS Take 10 mg by mouth daily.  . metoprolol succinate (TOPROL XL) 25 MG 24 hr tablet Take 1 tablet (25 mg total) by mouth daily.  . Multiple Vitamin (MULTI-VITAMIN) tablet Take 1 tablet by mouth daily.  . Omega-3 1000 MG CAPS Take 1,000 mg by mouth daily.  Marland Kitchen venlafaxine XR (EFFEXOR-XR) 150 MG 24 hr capsule  Take 150 mg by mouth daily with breakfast.  . warfarin (COUMADIN) 1 MG tablet Take 1 mg tablet by mouth on alternate days as directed by the coumadin clinic  . warfarin (COUMADIN) 5 MG tablet Take by mouth. Take 5 mg tablet by mouth on alternating days as directed by the coumadin clinic  . [DISCONTINUED] amiodarone (PACERONE) 200 MG tablet Take 1 tablet by mouth daily. Taking 100 mg daily  . [DISCONTINUED] metoprolol tartrate (LOPRESSOR) 25 MG tablet Take 25 mg by mouth 2 (two) times daily.     Allergies:   Latex and Oxycodone   Social History   Socioeconomic History  . Marital status: Married    Spouse name: Not on file  . Number of children: Not on file  . Years of education: Not on file  . Highest education level: Not on file  Occupational History  . Not on file  Tobacco Use  . Smoking status: Never Smoker  . Smokeless tobacco: Never Used  Substance and Sexual Activity  . Alcohol use: Never  . Drug use: Never  . Sexual activity: Not on file  Other Topics Concern  . Not on file  Social History Narrative  . Not on file   Social Determinants of Health   Financial Resource Strain: Not on file  Food Insecurity: Not on file  Transportation Needs: Not on file  Physical Activity: Not on file  Stress: Not on file  Social Connections: Not on file     Family History: The patient's family history is not on file.  ROS:   Please see the history of present illness.  (+) Minor bruising.   All other systems reviewed and are negative.  EKGs/Labs/Other Studies Reviewed:    The following studies were reviewed today: TTE 2021/03/26: IMPRESSIONS  1. Left ventricular ejection fraction, by estimation, is 55 to 60%. Left  ventricular ejection fraction by 3D volume is 58 %. The left ventricle has  normal function. The left ventricle has no regional wall motion  abnormalities. Left ventricular diastolic  parameters were normal.  2. Right ventricular systolic function is normal. The  right ventricular  size is normal. There is normal pulmonary artery systolic pressure. The  estimated right ventricular systolic pressure is 66.2 mmHg.  3. The mitral valve is grossly normal. Trivial mitral valve  regurgitation. No evidence of mitral stenosis.  4. The aortic valve is tricuspid. Aortic valve regurgitation is not  visualized. No aortic stenosis is present.  5. The inferior vena cava is normal in size with greater than 50%  respiratory variability, suggesting right atrial pressure of 3 mmHg.  Myoview 10/2020: IMPRESSIONS & RECOMMENDATIONS There is a small, fixed inferior wall defect of mild severity, without definite evidence of ischemia. Diaphragmatic attenuation artifact present. Atrial fibrillation present throughout study Recommend clinical correlation. Cardiac History: Hypertension, hyperlipidemia, tobacco abuse, A-Fib, DVT, PE. No previous studies.  LV EF & Wall Motion: Mildly dilated  LV size with mild LV systolic dysfunction, with mild global hypokinesis.  ABI 03/09/21: Summary:  Right: Resting right ankle-brachial index is within normal range. No  evidence of significant right lower extremity arterial disease. The right  toe-brachial index is normal.   Left: Resting left ankle-brachial index is within normal range. No  evidence of significant left lower extremity arterial disease. The left  toe-brachial index is normal.   EKG:   04/13/2021: EKG was not ordered today.  Recent Labs: 11/26/2020: ALT 23; BUN 15; Creatinine, Ser 1.05; Hemoglobin 14.7; Platelets 195; Potassium 4.6; Sodium 138  Recent Lipid Panel No results found for: CHOL, TRIG, HDL, CHOLHDL, VLDL, LDLCALC, LDLDIRECT   Risk Assessment/Calculations:    CHA2DS2-VASc Score = 3  This indicates a 3.2% annual risk of stroke. The patient's score is based upon: CHF History: No HTN History: Yes Diabetes History: No Stroke History: No Vascular Disease History: Yes Age Score: 1 Gender Score:  0   Physical Exam:    VS:  BP 118/62   Pulse (!) 56   Ht 6' (1.829 m)   Wt 206 lb 6.4 oz (93.6 kg)   SpO2 96%   BMI 27.99 kg/m     Wt Readings from Last 3 Encounters:  04/13/21 206 lb 6.4 oz (93.6 kg)  03/02/21 206 lb 3.2 oz (93.5 kg)  11/26/20 209 lb 7 oz (95 kg)     GEN: Well nourished, well developed in no acute distress HEENT: Normal NECK: No JVD; No carotid bruits LYMPHATICS: No lymphadenopathy CARDIAC: Bradycardic, regular, no murmurs, rubs, gallops RESPIRATORY:  Clear to auscultation without rales, wheezing or rhonchi  ABDOMEN: Soft, non-tender, non-distended MUSCULOSKELETAL:  No edema; No deformity  SKIN: Warm and dry NEUROLOGIC:  Alert and oriented x 3 PSYCHIATRIC:  Normal affect   ASSESSMENT:    1. Atrial fibrillation, unspecified type ([place])   2. Encounter for therapeutic drug monitoring   3. Hyperlipidemia, unspecified hyperlipidemia type   4. Medication management   5. Encounter for monitoring amiodarone therapy   6. Bilateral leg pain   7. Dyspnea on exertion   8. 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 Primary hypertension   10. History of pulmonary embolism    PLAN:    In order of problems listed above:  #Parosyxmal Afib: Previously followed by [facility] cardiology. CHADs-vasc 3. On warfarin for AC and metoprolol and amiodarone for rate control. Minimally symptomatic while in Afib. Currently in rhythm today and believes he has not had recurrence of Afib since 10/2020. Was planned for DCCV at [facility] but converted prior to the procedure. 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 Has remained active and walks without issues. Notably has started smoking again which may be contributing and continues to smoke today. Myoview in 10/2020 with no ischemia; possible infarct vs attenuation defect inferiorly. Normal EF on TTE with no valve abnormalities -Myoview at OSH negative for ischemia -TTE with normal BiV function, no significant valve disease -Smoking cessation counseling provided  #Bilateral LE Pain: Patient with bilateral lower extremity achiness that has been ongoing for  several months. Atypical in nature and does not seem to worsen with exercise but he notes his legs feel more faigued and weak with walking. ABI negative for obstruction -ABIs negative -Continue exercise  #HTN: Well controlled today. -Continue amlopdine 5mg  daily -Continue lisinopril 10mg  daily -Encouraged compliance -Will follow-up with PCP  #Tobacco Use: -Tobacco cessation counseling  #Risk stratification: -Check lipids    Medication Adjustments/Labs and Tests Ordered: Current medicines are reviewed at length with the patient today.  Concerns regarding medicines are outlined above.  Orders Placed This Encounter  Procedures  . TSH  . Hepatic function panel  . Lipid Profile   Meds ordered this encounter  Medications  . metoprolol succinate (TOPROL XL) 25 MG 24 hr tablet    Sig: Take 1 tablet (25 mg total) by mouth daily.    Dispense:  90 tablet    Refill:  2    Stopped tartrate switched to this.    Patient Instructions  Medication Instructions:   STOP TAKING AMIODARONE NOW  STOP TAKING METOPROLOL TARTRATE NOW  START TAKING METOPROLOL SUCCINATE (TOPROL XL) 25 MG BY MOUTH DAILY  *If you need a refill on your cardiac medications before your next appointment, please call your pharmacy*   Lab Work:  IN ONE WEEK--WILL CHECK TSH, LFTs, AND LIPIDS AT THAT TIME--COME  FASTING TO THIS LAB APPOINTMENT  If you have labs (blood work) drawn today and your tests are completely normal, you will receive your results only by: Marland Kitchen MyChart Message (if you have MyChart) OR . A paper copy in the mail If you have any lab test that is abnormal or we need to change your treatment, we will call you to review the results.   Follow-Up: At [facility], you and your health needs are our priority.  As part of our continuing mission to provide you with exceptional heart care, we have created designated Provider Care Teams.  These Care Teams include your primary Cardiologist (physician) and Advanced Practice Providers (APPs -  Physician Assistants and Nurse Practitioners) who all work together to provide you with the care you need, when you need it.  We recommend signing up for the patient portal called "MyChart".  Sign up information is provided on this After Visit Summary.  MyChart is used to connect with patients for Virtual Visits (Telemedicine).  Patients are able to view lab/test results, encounter notes, upcoming appointments, etc.  Non-urgent messages can be sent to your provider as well.   To learn more about what you can do with MyChart, go to [email or web address].    Your next appointment:   6 month(s)  The format for your next appointment:   In Person  Provider:   You will see one of the following Advanced Practice Providers on your designated Care Team:    Richardson Dopp, PA-C  Vin Moville, PA-C          Follow-up in 6 months.  I,Mathew Stumpf,acting as a Education administrator for Freada Bergeron, MD.,have documented all relevant documentation on the behalf of Freada Bergeron, MD,as directed by  Freada Bergeron, MD while in the presence of Freada Bergeron, MD.  I, Freada Bergeron, MD, have reviewed all documentation for this visit. The documentation on 04/13/21 for the exam, diagnosis, procedures, and orders are all accurate and  complete.  Signed, Freada Bergeron, MD  04/13/2021 9:27 AM    [place]

## 2021-04-20 ENCOUNTER — Other Ambulatory Visit: Payer: Self-pay

## 2021-04-20 ENCOUNTER — Ambulatory Visit (INDEPENDENT_AMBULATORY_CARE_PROVIDER_SITE_OTHER): Payer: Medicare Other

## 2021-04-20 ENCOUNTER — Telehealth: Payer: Self-pay | Admitting: *Deleted

## 2021-04-20 ENCOUNTER — Other Ambulatory Visit: Payer: Medicare Other | Admitting: *Deleted

## 2021-04-20 DIAGNOSIS — E785 Hyperlipidemia, unspecified: Secondary | ICD-10-CM

## 2021-04-20 DIAGNOSIS — I824Y9 Acute embolism and thrombosis of unspecified deep veins of unspecified proximal lower extremity: Secondary | ICD-10-CM

## 2021-04-20 DIAGNOSIS — Z79899 Other long term (current) drug therapy: Secondary | ICD-10-CM

## 2021-04-20 DIAGNOSIS — Z5181 Encounter for therapeutic drug level monitoring: Secondary | ICD-10-CM

## 2021-04-20 DIAGNOSIS — E7800 Pure hypercholesterolemia, unspecified: Secondary | ICD-10-CM

## 2021-04-20 DIAGNOSIS — I4891 Unspecified atrial fibrillation: Secondary | ICD-10-CM

## 2021-04-20 LAB — LIPID PANEL
Chol/HDL Ratio: 5.5 ratio — ABNORMAL HIGH (ref 0.0–5.0)
Cholesterol, Total: 241 mg/dL — ABNORMAL HIGH (ref 100–199)
HDL: 44 mg/dL (ref >39)
LDL Chol Calc (NIH): 174 mg/dL — ABNORMAL HIGH (ref 0–99)
Triglycerides: 128 mg/dL (ref 0–149)
VLDL Cholesterol Cal: 23 mg/dL (ref 5–40)

## 2021-04-20 LAB — TSH: TSH: 1.55 u[IU]/mL (ref 0.450–4.500)

## 2021-04-20 LAB — HEPATIC FUNCTION PANEL
ALT: 18 IU/L (ref 0–44)
AST: 19 IU/L (ref 0–40)
Albumin: 4.2 g/dL (ref 3.7–4.7)
Alkaline Phosphatase: 65 IU/L (ref 44–121)
Bilirubin Total: 0.2 mg/dL (ref 0.0–1.2)
Bilirubin, Direct: <0.1 mg/dL (ref 0.00–0.40)
Total Protein: 6.6 g/dL (ref 6.0–8.5)

## 2021-04-20 LAB — POCT INR: INR: 1.8 — AB (ref 2.0–3.0)

## 2021-04-20 MED ORDER — ATORVASTATIN CALCIUM 20 MG PO TABS
20.0000 mg | ORAL_TABLET | Freq: Every day | ORAL | 1 refills | Status: DC
Start: 2021-04-20 — End: 2021-09-01

## 2021-04-20 NOTE — Telephone Encounter (Signed)
Pt made aware of lab results and recommendations per Dr. Radford Pax, covering for Dr. Johney Frame. Pt aware he needs to start taking atorvastatin 20 mg po daily, and come in for repeat lipids and ALT in 6 weeks. Confirmed the pharmacy of choice with the pt. Scheduled the pt to come in for repeat lipids and ALT on 06/01/21.  He is aware to come fasting. Pt verbalized understanding and agrees with this plan.

## 2021-04-20 NOTE — Patient Instructions (Signed)
Description   Start taking Warfarin 6mg  daily.  Recheck INR in 1 week (normally 3 weeks). Amio d/c'd today (04/13/21) at [place] visit.  Anticoagulation Clinic [phone]

## 2021-04-20 NOTE — Telephone Encounter (Signed)
-----   Message from Sueanne Margarita, MD sent at 04/20/2021  4:43 PM EDT ----- LDL too high - start Lipitor 20mg  daily and repeat FLP and ALT in 6 weeks

## 2021-04-27 ENCOUNTER — Ambulatory Visit (INDEPENDENT_AMBULATORY_CARE_PROVIDER_SITE_OTHER): Payer: Medicare Other | Admitting: *Deleted

## 2021-04-27 ENCOUNTER — Other Ambulatory Visit: Payer: Self-pay

## 2021-04-27 DIAGNOSIS — I4891 Unspecified atrial fibrillation: Secondary | ICD-10-CM

## 2021-04-27 DIAGNOSIS — I824Y9 Acute embolism and thrombosis of unspecified deep veins of unspecified proximal lower extremity: Secondary | ICD-10-CM | POA: Diagnosis not present

## 2021-04-27 DIAGNOSIS — Z5181 Encounter for therapeutic drug level monitoring: Secondary | ICD-10-CM | POA: Diagnosis not present

## 2021-04-27 LAB — POCT INR: INR: 2.2 (ref 2.0–3.0)

## 2021-04-27 NOTE — Patient Instructions (Signed)
Description   Continue taking Warfarin 6mg  daily.  Recheck INR in 2 weeks. Amio d/c'd today (04/13/21) at [place] visit.  Anticoagulation Clinic [phone]

## 2021-05-11 ENCOUNTER — Other Ambulatory Visit: Payer: Self-pay

## 2021-05-11 ENCOUNTER — Ambulatory Visit (INDEPENDENT_AMBULATORY_CARE_PROVIDER_SITE_OTHER): Payer: Medicare Other | Admitting: *Deleted

## 2021-05-11 DIAGNOSIS — I4891 Unspecified atrial fibrillation: Secondary | ICD-10-CM

## 2021-05-11 DIAGNOSIS — I824Y9 Acute embolism and thrombosis of unspecified deep veins of unspecified proximal lower extremity: Secondary | ICD-10-CM | POA: Diagnosis not present

## 2021-05-11 DIAGNOSIS — Z5181 Encounter for therapeutic drug level monitoring: Secondary | ICD-10-CM

## 2021-05-11 LAB — POCT INR: INR: 2.5 (ref 2.0–3.0)

## 2021-05-11 NOTE — Patient Instructions (Signed)
Description   Continue taking Warfarin 6mg  daily.  Recheck INR in 3 weeks. Amio d/c'd today (04/13/21) at [place] visit.  Anticoagulation Clinic [phone]

## 2021-06-01 ENCOUNTER — Ambulatory Visit (INDEPENDENT_AMBULATORY_CARE_PROVIDER_SITE_OTHER): Payer: Medicare Other | Admitting: *Deleted

## 2021-06-01 ENCOUNTER — Other Ambulatory Visit: Payer: Medicare Other

## 2021-06-01 ENCOUNTER — Other Ambulatory Visit: Payer: Self-pay

## 2021-06-01 DIAGNOSIS — I824Y9 Acute embolism and thrombosis of unspecified deep veins of unspecified proximal lower extremity: Secondary | ICD-10-CM

## 2021-06-01 DIAGNOSIS — Z79899 Other long term (current) drug therapy: Secondary | ICD-10-CM

## 2021-06-01 DIAGNOSIS — Z5181 Encounter for therapeutic drug level monitoring: Secondary | ICD-10-CM

## 2021-06-01 DIAGNOSIS — E7800 Pure hypercholesterolemia, unspecified: Secondary | ICD-10-CM

## 2021-06-01 DIAGNOSIS — I4891 Unspecified atrial fibrillation: Secondary | ICD-10-CM | POA: Diagnosis not present

## 2021-06-01 DIAGNOSIS — E785 Hyperlipidemia, unspecified: Secondary | ICD-10-CM

## 2021-06-01 LAB — LIPID PANEL
Chol/HDL Ratio: 3.6 ratio (ref 0.0–5.0)
Cholesterol, Total: 166 mg/dL (ref 100–199)
HDL: 46 mg/dL (ref >39)
LDL Chol Calc (NIH): 106 mg/dL — ABNORMAL HIGH (ref 0–99)
Triglycerides: 76 mg/dL (ref 0–149)
VLDL Cholesterol Cal: 14 mg/dL (ref 5–40)

## 2021-06-01 LAB — ALT: ALT: 21 IU/L (ref 0–44)

## 2021-06-01 LAB — POCT INR: INR: 2.2 (ref 2.0–3.0)

## 2021-06-01 NOTE — Patient Instructions (Signed)
Description   Continue taking Warfarin 6mg  daily.  Recheck INR in 4 weeks. Amio d/c'd today (04/13/21) at [place] visit.  Anticoagulation Clinic [phone]

## 2021-06-18 ENCOUNTER — Telehealth: Payer: Self-pay | Admitting: Cardiology

## 2021-06-18 MED ORDER — WARFARIN SODIUM 1 MG PO TABS
ORAL_TABLET | ORAL | 0 refills | Status: DC
Start: 2021-06-18 — End: 2021-09-03

## 2021-06-18 MED ORDER — WARFARIN SODIUM 5 MG PO TABS: ORAL_TABLET | ORAL | 0 refills | Status: DC

## 2021-06-18 NOTE — Telephone Encounter (Signed)
Spoke with the patient who states that he is no longer followed by hematology and would like to know if Dr. Johney Frame could prescribe his coumadin. He states that he is needing a refill soon. Patient aware that message will be routed to Dr. Johney Frame and coumadin clinic to advise on refill.

## 2021-06-18 NOTE — Telephone Encounter (Signed)
Pt c/o medication issue:  1. Name of Medication:   warfarin (COUMADIN) 5 MG tablet warfarin (COUMADIN) 1 MG tablet  2. How are you currently taking this medication (dosage and times per day)? As prescribed   3. Are you having a reaction (difficulty breathing--STAT)? No   4. What is your medication issue?   Patient states this medication was previously prescribed by his hematologist, although it looks like Dr. Johney Frame has prescribed. He states he is no longer seeing his hematologist and he is hoping Dr. Johney Frame can take over this medication. He states [facility] sent out a request for it to be refilled.

## 2021-06-18 NOTE — Telephone Encounter (Addendum)
Patient has been consistently been followed in our coumadin clinic. We should be the ones refilling. I will send rx Offered pt 6mg  tablet but he prefers the 5's and 1's

## 2021-06-22 NOTE — Telephone Encounter (Signed)
Dr. Johney Frame, please review mychart message pt sent. I advised him to call [place] at [phone] and they will help facilitate in finding him a new PCP.   I told him we would be ok with refilling his cardiac/BP meds his PCP has been filling, but I would have to reach out to you to advise on the non-cardiac meds and refilling them, like clonazepam and effexor.  He is aware that we typically do not fill those meds.   Please advise and thank you!

## 2021-06-29 ENCOUNTER — Ambulatory Visit (INDEPENDENT_AMBULATORY_CARE_PROVIDER_SITE_OTHER): Payer: Medicare Other | Admitting: *Deleted

## 2021-06-29 ENCOUNTER — Other Ambulatory Visit: Payer: Self-pay

## 2021-06-29 DIAGNOSIS — I4891 Unspecified atrial fibrillation: Secondary | ICD-10-CM

## 2021-06-29 DIAGNOSIS — I824Y9 Acute embolism and thrombosis of unspecified deep veins of unspecified proximal lower extremity: Secondary | ICD-10-CM

## 2021-06-29 DIAGNOSIS — Z5181 Encounter for therapeutic drug level monitoring: Secondary | ICD-10-CM

## 2021-06-29 LAB — POCT INR: INR: 2.2 (ref 2.0–3.0)

## 2021-06-29 NOTE — Patient Instructions (Signed)
Description   Continue taking Warfarin 6mg  daily.  Recheck INR in 5 weeks. Amio d/c'd today (04/13/21) at [place] visit.  Anticoagulation Clinic [phone]

## 2021-07-15 ENCOUNTER — Other Ambulatory Visit: Payer: Self-pay

## 2021-07-15 ENCOUNTER — Ambulatory Visit: Payer: Medicare Other | Admitting: Medical-Surgical

## 2021-07-15 DIAGNOSIS — Z7689 Persons encountering health services in other specified circumstances: Secondary | ICD-10-CM

## 2021-07-15 DIAGNOSIS — Z114 Encounter for screening for human immunodeficiency virus [HIV]: Secondary | ICD-10-CM

## 2021-07-15 DIAGNOSIS — Z0000 Encounter for general adult medical examination without abnormal findings: Secondary | ICD-10-CM

## 2021-07-15 DIAGNOSIS — Z1329 Encounter for screening for other suspected endocrine disorder: Secondary | ICD-10-CM

## 2021-08-03 ENCOUNTER — Other Ambulatory Visit: Payer: Self-pay

## 2021-08-03 ENCOUNTER — Ambulatory Visit (INDEPENDENT_AMBULATORY_CARE_PROVIDER_SITE_OTHER): Payer: Medicare Other | Admitting: *Deleted

## 2021-08-03 DIAGNOSIS — I4891 Unspecified atrial fibrillation: Secondary | ICD-10-CM | POA: Diagnosis not present

## 2021-08-03 DIAGNOSIS — I824Y9 Acute embolism and thrombosis of unspecified deep veins of unspecified proximal lower extremity: Secondary | ICD-10-CM | POA: Diagnosis not present

## 2021-08-03 DIAGNOSIS — Z5181 Encounter for therapeutic drug level monitoring: Secondary | ICD-10-CM | POA: Diagnosis not present

## 2021-08-03 LAB — POCT INR: INR: 1.5 — AB (ref 2.0–3.0)

## 2021-08-03 NOTE — Patient Instructions (Signed)
Description   Today and tomorrow take 6.'5mg'$  then continue taking Warfarin '6mg'$  daily.  Recheck INR in 2 weeks (normally 5 weeks). Amio d/c'd (04/13/21) at [place] visit.  Anticoagulation Clinic [phone]

## 2021-08-09 ENCOUNTER — Ambulatory Visit (INDEPENDENT_AMBULATORY_CARE_PROVIDER_SITE_OTHER): Payer: Medicare Other | Admitting: Medical-Surgical

## 2021-08-09 ENCOUNTER — Encounter: Payer: Self-pay | Admitting: Medical-Surgical

## 2021-08-09 ENCOUNTER — Other Ambulatory Visit: Payer: Self-pay

## 2021-08-09 VITALS — BP 131/78 | HR 76 | Resp 20 | Wt 203.9 lb

## 2021-08-09 DIAGNOSIS — Z7689 Persons encountering health services in other specified circumstances: Secondary | ICD-10-CM

## 2021-08-09 DIAGNOSIS — I10 Essential (primary) hypertension: Secondary | ICD-10-CM | POA: Diagnosis not present

## 2021-08-09 DIAGNOSIS — I4891 Unspecified atrial fibrillation: Secondary | ICD-10-CM

## 2021-08-09 DIAGNOSIS — E782 Mixed hyperlipidemia: Secondary | ICD-10-CM

## 2021-08-09 DIAGNOSIS — F418 Other specified anxiety disorders: Secondary | ICD-10-CM | POA: Diagnosis not present

## 2021-08-09 HISTORY — DX: Mixed hyperlipidemia: E78.2

## 2021-08-09 NOTE — Progress Notes (Signed)
New Patient Office Visit  Subjective:  Patient ID: Larry Odonnell, male    DOB: 10-Sep-1946  Age: 75 y.o. MRN: [id number]  CC:  Chief Complaint  Patient presents with   Establish Care    HPI Larry Odonnell presents to establish care.  Hypertension-taking amlodipine 5 mg daily, lisinopril 10 mg daily, and metoprolol succinate 25 mg daily as prescribed.  No missed doses.  Tolerating medications well without side effects.  Managed by cardiology following a low-sodium diet and avoiding fast food. Denies chest pain, shortness of breath, orthopnea, palpitations, lower extremity edema, headaches, vision changes, and syncope.  HLD- Taking atorvastatin 20 mg daily as prescribed with no missed doses.  Tolerating the medication well without side effects.  Following a low-fat, heart healthy diet.  Anxiety/depression- Taking venlafaxine 150 mg daily and clonazepam 0.5 mg daily as needed as prescribed, tolerating well without side effects.  Feels the medication is working well but still has some breakthrough symptoms. is not currently doing counseling.  Big stressors include divorce/separation and relocation. none.   None .  PE/DVT-currently taking warfarin 6 mg daily, tolerating well without side effects.  He does have an INR meter at home and was previously doing INR checks from home but when he switched to a [facility] facility, was told to call and does not accept this.  This is followed by cardiology.  Past Medical History:  Diagnosis Date   A-fib [facility])    Allergy    Latex   Anxiety 2006   Cancer [facility]) 2012   Prostate   Cataract 2017   Surgery both eyes   Depression 2006   DVT (deep venous thrombosis) (HCC)    Hypertension    Mixed hyperlipidemia 08/09/2021   Pulmonary embolism [facility])     Past Surgical History:  Procedure Laterality Date   EYE SURGERY  2017   Cataracts   PROSTATECTOMY     TONSILLECTOMY      Family History  Problem Relation Age of Onset   Anxiety disorder Mother     Cancer Mother    Stroke Mother    Alcohol abuse Father    Early death Father    Anxiety disorder Brother    Depression Brother    Anxiety disorder Daughter    Depression Daughter     Social History   Socioeconomic History   Marital status: Married    Spouse name: Not on file   Number of children: Not on file   Years of education: Not on file   Highest education level: Not on file  Occupational History   Occupation: Retired  Tobacco Use   Smoking status: Light Smoker    Packs/day: 0.50    Years: 10.00    Pack years: 5.00    Types: Cigarettes   Smokeless tobacco: Never   Tobacco comments:    Quit 1990-2015  Vaping Use   Vaping Use: Never used  Substance and Sexual Activity   Alcohol use: Not Currently    Comment: No alcohol since 1995   Drug use: Never   Sexual activity: Not Currently    Birth control/protection: None  Other Topics Concern   Not on file  Social History Narrative   Not on file   Social Determinants of Health   Financial Resource Strain: Not on file  Food Insecurity: Not on file  Transportation Needs: Not on file  Physical Activity: Not on file  Stress: Not on file  Social Connections: Not on file  Intimate Partner Violence: Not  on file    ROS Review of Systems  Constitutional:  Negative for chills, fatigue, fever and unexpected weight change.  HENT:  Negative for congestion, rhinorrhea, sinus pressure and sore throat.   Eyes:  Negative for visual disturbance.  Respiratory:  Negative for cough, chest tightness, shortness of breath and wheezing.   Cardiovascular:  Negative for chest pain, palpitations and leg swelling.  Gastrointestinal:  Negative for abdominal pain, constipation, diarrhea, nausea and vomiting.  Genitourinary:  Negative for dysuria, frequency and urgency.  Musculoskeletal:  Positive for arthralgias (Occasional).  Skin:  Negative for rash.  Neurological:  Positive for dizziness (Occasional). Negative for light-headedness and  headaches.  Psychiatric/Behavioral:  Negative for dysphoric mood, self-injury, sleep disturbance and suicidal ideas. The patient is not nervous/anxious.    Objective:   Today's Vitals: BP 131/78 (BP Location: Right Arm, Patient Position: Sitting, Cuff Size: Normal)   Pulse 76   Resp 20   Wt 203 lb 14.4 oz (92.5 kg)   SpO2 98%   BMI 27.65 kg/m   Physical Exam Vitals and nursing note reviewed.  Constitutional:      General: He is not in acute distress.    Appearance: Normal appearance.  HENT:     Head: Normocephalic and atraumatic.  Cardiovascular:     Rate and Rhythm: Normal rate and regular rhythm.     Pulses: Normal pulses.     Heart sounds: Normal heart sounds. No murmur heard.   No friction rub. No gallop.  Pulmonary:     Effort: Pulmonary effort is normal. No respiratory distress.     Breath sounds: Normal breath sounds.  Skin:    General: Skin is warm and dry.  Neurological:     Mental Status: He is alert and oriented to person, place, and time.  Psychiatric:        Mood and Affect: Mood normal.        Behavior: Behavior normal.        Thought Content: Thought content normal.        Judgment: Judgment normal.    Assessment & Plan:   1. Encounter to establish care Reviewed available information and discussed care concerns with patient.   2. Anxiety with depression Well controlled. Continue Effexor '150mg'$  daily. Continue sparing use of Klonopin 0.'5mg'$  daily prn, avoid using for sleep.   3. Atrial fibrillation, unspecified type [facility]) Managed by cardiology. Is interested in being able to return to checking INR at home since he has the monitor and did well with it before. Will check with our practice policy and our clinical pharmacist for more information regarding this.   4. Primary hypertension Managed by cardiology.   5. Mixed hyperlipidemia Managed by cardiology  Outpatient Encounter Medications as of 08/09/2021  Medication Sig   amLODipine (NORVASC) 5 MG  tablet Take by mouth.   atorvastatin (LIPITOR) 20 MG tablet Take 1 tablet (20 mg total) by mouth daily.   Cholecalciferol (VITAMIN D3) 1.25 MG (50000 UT) CAPS Take 1 capsule by mouth daily.   clonazePAM (KLONOPIN) 0.5 MG tablet 0.5 mg as needed for anxiety.   lisinopril (ZESTRIL) 10 MG tablet Take 10 mg by mouth daily.   Melatonin 10 MG TABS Take 10 mg by mouth daily.   metoprolol succinate (TOPROL XL) 25 MG 24 hr tablet Take 1 tablet (25 mg total) by mouth daily.   Multiple Vitamin (MULTI-VITAMIN) tablet Take 1 tablet by mouth daily.   Omega-3 1000 MG CAPS Take 1,000 mg by mouth daily.  venlafaxine XR (EFFEXOR-XR) 150 MG 24 hr capsule Take 150 mg by mouth daily with breakfast.   warfarin (COUMADIN) 1 MG tablet Take 1 mg tablet by mouth daily along with '5mg'$  tablet or as directed   warfarin (COUMADIN) 5 MG tablet Take 5 mg tablet by mouth daily along with '1mg'$  tablet or as directed by the coumadin clinic   No facility-administered encounter medications on file as of 08/09/2021.    Follow-up: Return in about 6 months (around 02/09/2022) for mood follow up.   Clearnce Sorrel, DNP, APRN, FNP-BC [place] [facility]

## 2021-08-12 ENCOUNTER — Encounter: Payer: Self-pay | Admitting: Medical-Surgical

## 2021-08-17 ENCOUNTER — Other Ambulatory Visit: Payer: Self-pay

## 2021-08-17 ENCOUNTER — Ambulatory Visit (INDEPENDENT_AMBULATORY_CARE_PROVIDER_SITE_OTHER): Payer: Medicare Other | Admitting: Student-PharmD

## 2021-08-17 DIAGNOSIS — Z5181 Encounter for therapeutic drug level monitoring: Secondary | ICD-10-CM | POA: Diagnosis not present

## 2021-08-17 DIAGNOSIS — I4891 Unspecified atrial fibrillation: Secondary | ICD-10-CM | POA: Diagnosis not present

## 2021-08-17 DIAGNOSIS — I824Y9 Acute embolism and thrombosis of unspecified deep veins of unspecified proximal lower extremity: Secondary | ICD-10-CM | POA: Diagnosis not present

## 2021-08-17 LAB — POCT INR: INR: 1.7 — AB (ref 2.0–3.0)

## 2021-08-17 NOTE — Patient Instructions (Signed)
Description   Today take 8.'5mg'$  then continue taking Warfarin '6mg'$  daily.  Recheck INR in 2-3 weeks. Amio d/c'd (04/13/21) at [place] visit.  Anticoagulation Clinic [phone]

## 2021-09-01 ENCOUNTER — Ambulatory Visit (INDEPENDENT_AMBULATORY_CARE_PROVIDER_SITE_OTHER): Payer: Medicare Other

## 2021-09-01 ENCOUNTER — Telehealth: Payer: Self-pay | Admitting: Cardiology

## 2021-09-01 ENCOUNTER — Other Ambulatory Visit: Payer: Self-pay

## 2021-09-01 DIAGNOSIS — I4891 Unspecified atrial fibrillation: Secondary | ICD-10-CM | POA: Diagnosis not present

## 2021-09-01 DIAGNOSIS — Z5181 Encounter for therapeutic drug level monitoring: Secondary | ICD-10-CM

## 2021-09-01 DIAGNOSIS — I824Y9 Acute embolism and thrombosis of unspecified deep veins of unspecified proximal lower extremity: Secondary | ICD-10-CM | POA: Diagnosis not present

## 2021-09-01 LAB — POCT INR: INR: 2 (ref 2.0–3.0)

## 2021-09-01 MED ORDER — ROSUVASTATIN CALCIUM 10 MG PO TABS: 10.0000 mg | ORAL_TABLET | Freq: Every day | ORAL | 0 refills | Status: DC

## 2021-09-01 NOTE — Patient Instructions (Signed)
Description   Continue taking Warfarin '6mg'$  daily.  Recheck INR in 4 weeks. Amio d/c'd (04/13/21) at [place] visit.  Anticoagulation Clinic [phone]

## 2021-09-01 NOTE — Telephone Encounter (Signed)
[place] needs a new rx from the office so the patient can be sent supplies to do at home INR checks.  Company has been provided the Coumadin clinic fax #.   Our office can reach the company by fax at: [phone]

## 2021-09-01 NOTE — Telephone Encounter (Signed)
Spoke with the pt about mychart message sent today. Pt is reaching out to let Dr. Johney Frame or her covering know that since he started taking atorvastatin back in April 2022, he has had consistent complaints of bilateral leg pains, heaviness in legs, and overall feeling fatigued in both extremities.  Pt states he is aware that he needs to be on some sort of lipid management, but would like for [place] to advise on an alternative and more tolerable regimen for this.  Pt states he started holding his atorvastatin last night, to see if his complaints improve. Pt would like for Dr. Jacolyn Reedy covering or our lipid clinic, to further advise on this matter.  Informed the pt that I will route his concerns to both Dr. Jacolyn Reedy in-basket for her covering to advise, as well as our Pharmacist in lipid clinic.  Informed the pt that I will follow-up with him accordingly thereafter, once MD or Pharmacist advise.  Advised him to continue holding his atorvastatin, until otherwise advised.  Pt verbalized understanding and agrees with this plan. Pt was more than gracious for all the assistance provided.

## 2021-09-01 NOTE — Telephone Encounter (Addendum)
Pt seen in the office this morning for INR check and will continue in office INR checks with [facility] until stable again, then will have new PCP manage & set up home testing with them.   Also, see phone note from Samuel Bouche, NP at the pt's PCP on 08/12/2021 regarding home monitoring.   Spoke with pt and he states he does want to continue being monitored in our office and this time and he will think about having PCP manage but he prefers to come in. Also, read the note from PCP back to the pt since he was thought he would have to go in to the office every 3 weeks, after reading it, he verbalized understanding and states he will look at it again; he was appreciative. Advised that if he wants PCP to start self checks to let us know so we can be updated and he verbalized understanding.

## 2021-09-01 NOTE — Telephone Encounter (Signed)
RE: Atorvastatin '20MG'$  daily Received: Today Supple, Harlon Flor, RPH-CPP  Winifred Olive M, LPN I don't see any other statins listed in the past that we've prescribed. Would give him a week off the atorvastatin to make sure his symptoms improve, then try rosuvastatin '10mg'$  daily to see if he tolerates it better. If muscle aches return, he should cut rosuvastatin in half to see if he tolerates lower dose better.   Thanks,  Megan     Pt made aware of recommendations per Pharmacist in lipid clinic, Megan Supple.  Pt is aware to stop atorvastatin, and start rosuvastatin 10 mg po daily in one week, on 9/14. Confirmed the pharmacy of choice with the pt.  Advised the pt if his muscle aches return with change to rosuvastatin 10 mg, then he should notify us so we can advise him to cut his rosuvastatin in 1/2 to see if he tolerates that lower dose better.  Pt verbalized understanding and agrees with this plan.

## 2021-09-03 ENCOUNTER — Other Ambulatory Visit: Payer: Self-pay

## 2021-09-03 MED ORDER — WARFARIN SODIUM 1 MG PO TABS: ORAL_TABLET | ORAL | 1 refills | Status: DC

## 2021-09-03 MED ORDER — WARFARIN SODIUM 5 MG PO TABS: ORAL_TABLET | ORAL | 1 refills | Status: DC

## 2021-09-28 ENCOUNTER — Encounter: Payer: Self-pay | Admitting: Medical-Surgical

## 2021-09-29 ENCOUNTER — Other Ambulatory Visit: Payer: Self-pay

## 2021-09-29 ENCOUNTER — Ambulatory Visit (INDEPENDENT_AMBULATORY_CARE_PROVIDER_SITE_OTHER): Payer: Medicare Other

## 2021-09-29 DIAGNOSIS — I824Y9 Acute embolism and thrombosis of unspecified deep veins of unspecified proximal lower extremity: Secondary | ICD-10-CM | POA: Diagnosis not present

## 2021-09-29 DIAGNOSIS — Z5181 Encounter for therapeutic drug level monitoring: Secondary | ICD-10-CM | POA: Diagnosis not present

## 2021-09-29 DIAGNOSIS — I4891 Unspecified atrial fibrillation: Secondary | ICD-10-CM

## 2021-09-29 LAB — POCT INR: INR: 2 (ref 2.0–3.0)

## 2021-09-29 NOTE — Patient Instructions (Signed)
Description   Take 6.5mg  today and then continue taking Warfarin 6mg  daily.  Recheck INR in 5 weeks. Amio d/c'd (04/13/21) at [place] visit. Anticoagulation Clinic [phone]

## 2021-11-03 ENCOUNTER — Ambulatory Visit (INDEPENDENT_AMBULATORY_CARE_PROVIDER_SITE_OTHER): Payer: Medicare Other

## 2021-11-03 ENCOUNTER — Other Ambulatory Visit: Payer: Self-pay

## 2021-11-03 DIAGNOSIS — Z5181 Encounter for therapeutic drug level monitoring: Secondary | ICD-10-CM

## 2021-11-03 DIAGNOSIS — I4891 Unspecified atrial fibrillation: Secondary | ICD-10-CM

## 2021-11-03 DIAGNOSIS — I824Y9 Acute embolism and thrombosis of unspecified deep veins of unspecified proximal lower extremity: Secondary | ICD-10-CM

## 2021-11-03 LAB — POCT INR: INR: 2.2 (ref 2.0–3.0)

## 2021-11-03 NOTE — Patient Instructions (Signed)
Description   Continue taking Warfarin 6mg  daily.  Recheck INR in 6 weeks.  Anticoagulation Clinic [phone]

## 2021-11-08 ENCOUNTER — Other Ambulatory Visit: Payer: Self-pay | Admitting: *Deleted

## 2021-11-08 MED ORDER — ROSUVASTATIN CALCIUM 10 MG PO TABS: 10.0000 mg | ORAL_TABLET | Freq: Every day | ORAL | 0 refills | Status: DC

## 2021-11-14 ENCOUNTER — Encounter: Payer: Self-pay | Admitting: Medical-Surgical

## 2021-11-15 ENCOUNTER — Other Ambulatory Visit: Payer: Self-pay

## 2021-11-15 ENCOUNTER — Ambulatory Visit (INDEPENDENT_AMBULATORY_CARE_PROVIDER_SITE_OTHER): Payer: Medicare Other | Admitting: Medical-Surgical

## 2021-11-15 ENCOUNTER — Encounter: Payer: Self-pay | Admitting: Medical-Surgical

## 2021-11-15 VITALS — BP 123/74 | HR 75 | Resp 20 | Ht 72.0 in | Wt 204.9 lb

## 2021-11-15 DIAGNOSIS — R197 Diarrhea, unspecified: Secondary | ICD-10-CM | POA: Diagnosis not present

## 2021-11-15 DIAGNOSIS — M25552 Pain in left hip: Secondary | ICD-10-CM

## 2021-11-15 DIAGNOSIS — G8929 Other chronic pain: Secondary | ICD-10-CM | POA: Diagnosis not present

## 2021-11-15 NOTE — Progress Notes (Signed)
  HPI with pertinent ROS:   CC: loose frequent stools  HPI: Pleasant 75 year old male presenting today to discuss a problem with his bowels.  Notes that for several months he has had some issues with having loose, watery stools especially after eating.  He is positive for fecal urgency and frequency and has had some incontinence episodes.  Describes his stool as loose, muddy.  Previously in a routine with bowel movements but this has definitely changed.  He did have a colonoscopy about 5 years ago where they found 1 polyp but no other abnormalities.  No associated symptoms including fever, chills, nausea, vomiting, hematemesis, hematochezia, melena, excessive flatulence, belching, mucousy stools, or abdominal cramping.  Also denies abdominal tenderness and bloating.  Previously seeing an orthopedic provider at [facility] where he would get cortisone injections into his left hip for severe osteoarthritis.  Since he is relocated to [facility], he is wondering if there is someone in the nearby area who would be able to do these injections or discuss further options.  I reviewed the past medical history, family history, social history, surgical history, and allergies today and no changes were needed.  Please see the problem list section below in epic for further details.   Physical exam:   General: Well Developed, well nourished, and in no acute distress.  Neuro: Alert and oriented x3.  HEENT: Normocephalic, atraumatic.  Skin: Warm and dry. Cardiac: Regular rate and rhythm, no murmurs rubs or gallops, no lower extremity edema.  Respiratory: Clear to auscultation bilaterally. Not using accessory muscles, speaking in full sentences. Abdomen: Soft, nontender, nondistended. Bowel sounds + x 4 quadrants. No HSM appreciated.  Impression and Recommendations:    1. Diarrhea, unspecified type Checking labs today.  Also checking stool studies for further evaluation.  Unclear etiology.  Consider possible new  onset food allergy versus irritable bowel disease.  Could possibly consider infectious agent however with no systemic symptoms and the length of time this has been going on, this is less than likely.  Hold off on antidiarrheals at this point until we figure out what is possibly causing this. - CBC with Differential - COMPLETE METABOLIC PANEL WITH GFR - Celiac Disease Panel - C-reactive protein - TSH - Ova and parasite examination - C. difficile GDH and Toxin A/B - Stool, WBC/Lactoferrin - Fecal Fat, Qualitative - Cryptosporidium Antigen, EIA - Giardia antigen  2. Chronic left hip pain Recommend follow-up with Dr. Darene Lamer here in our office for further evaluation and possible injections.  Business card provided and advised patient to schedule at his convenience at our front desk.  Return if symptoms worsen or fail to improve. ___________________________________________ Clearnce Sorrel, DNP, APRN, FNP-BC Primary [facility] and [place]

## 2021-11-15 NOTE — Telephone Encounter (Signed)
LVM for patient to call back to get this appt scheduled. AM

## 2021-11-15 NOTE — Telephone Encounter (Signed)
Checked the room, didn't see any glasses. I have called pt and let him know.

## 2021-11-16 LAB — CBC WITH DIFFERENTIAL/PLATELET
Absolute Monocytes: 1196 cells/uL — ABNORMAL HIGH (ref 200–950)
Basophils Absolute: 94 cells/uL (ref 0–200)
Basophils Relative: 0.9 %
Eosinophils Absolute: 260 cells/uL (ref 15–500)
Eosinophils Relative: 2.5 %
HCT: 43.9 % (ref 38.5–50.0)
Hemoglobin: 15.3 g/dL (ref 13.2–17.1)
Lymphs Abs: 2610 cells/uL (ref 850–3900)
MCH: 31.9 pg (ref 27.0–33.0)
MCHC: 34.9 g/dL (ref 32.0–36.0)
MCV: 91.5 fL (ref 80.0–100.0)
MPV: 10.3 fL (ref 7.5–12.5)
Monocytes Relative: 11.5 %
Neutro Abs: 6240 cells/uL (ref 1500–7800)
Neutrophils Relative %: 60 %
Platelets: 220 10*3/uL (ref 140–400)
RBC: 4.8 10*6/uL (ref 4.20–5.80)
RDW: 12.3 % (ref 11.0–15.0)
Total Lymphocyte: 25.1 %
WBC: 10.4 10*3/uL (ref 3.8–10.8)

## 2021-11-16 LAB — C-REACTIVE PROTEIN: CRP: 5 mg/L (ref <8.0)

## 2021-11-16 LAB — CELIAC DISEASE PANEL
(tTG) Ab, IgA: <1 U/mL
(tTG) Ab, IgG: <1 U/mL
Gliadin IgA: 2.4 U/mL
Gliadin IgG: <1 U/mL
Immunoglobulin A: 516 mg/dL — ABNORMAL HIGH (ref 70–320)

## 2021-11-16 LAB — COMPLETE METABOLIC PANEL WITH GFR
AG Ratio: 1.6 (calc) (ref 1.0–2.5)
ALT: 22 U/L (ref 9–46)
AST: 22 U/L (ref 10–35)
Albumin: 4.4 g/dL (ref 3.6–5.1)
Alkaline phosphatase (APISO): 57 U/L (ref 35–144)
BUN: 14 mg/dL (ref 7–25)
CO2: 29 mmol/L (ref 20–32)
Calcium: 9.6 mg/dL (ref 8.6–10.3)
Chloride: 103 mmol/L (ref 98–110)
Creat: 0.93 mg/dL (ref 0.70–1.28)
Globulin: 2.8 g/dL (calc) (ref 1.9–3.7)
Glucose, Bld: 85 mg/dL (ref 65–99)
Potassium: 4.7 mmol/L (ref 3.5–5.3)
Sodium: 139 mmol/L (ref 135–146)
Total Bilirubin: 0.5 mg/dL (ref 0.2–1.2)
Total Protein: 7.2 g/dL (ref 6.1–8.1)
eGFR: 86 mL/min/{1.73_m2} (ref >=60)

## 2021-11-16 LAB — TSH: TSH: 1.46 mIU/L (ref 0.40–4.50)

## 2021-11-23 ENCOUNTER — Ambulatory Visit (INDEPENDENT_AMBULATORY_CARE_PROVIDER_SITE_OTHER): Payer: Medicare Other | Admitting: Sports Medicine

## 2021-11-23 ENCOUNTER — Ambulatory Visit (INDEPENDENT_AMBULATORY_CARE_PROVIDER_SITE_OTHER): Payer: Medicare Other

## 2021-11-23 ENCOUNTER — Encounter: Payer: Self-pay | Admitting: Sports Medicine

## 2021-11-23 ENCOUNTER — Other Ambulatory Visit: Payer: Self-pay

## 2021-11-23 DIAGNOSIS — G8929 Other chronic pain: Secondary | ICD-10-CM

## 2021-11-23 DIAGNOSIS — M25512 Pain in left shoulder: Secondary | ICD-10-CM | POA: Diagnosis not present

## 2021-11-23 DIAGNOSIS — M25552 Pain in left hip: Secondary | ICD-10-CM

## 2021-11-23 DIAGNOSIS — M1612 Unilateral primary osteoarthritis, left hip: Secondary | ICD-10-CM | POA: Insufficient documentation

## 2021-11-23 HISTORY — DX: Unilateral primary osteoarthritis, left hip: M16.12

## 2021-11-23 MED ORDER — TRAMADOL HCL 50 MG PO TABS: 50.0000 mg | ORAL_TABLET | Freq: Every day | ORAL | 0 refills | Status: DC

## 2021-11-23 NOTE — Assessment & Plan Note (Signed)
This is a pleasant 75 year old male, known hip osteoarthritis, last treated in 2020 at [facility] with a hip joint injection under fluoroscope. He is having a recurrence of pain now, left groin, today we repeated his hip joint injection, left-sided with ultrasound guidance, home conditioning exercises given, updated x-rays today. Return to see me in 4 to 6 weeks as needed. Not interested in arthroplasty at this juncture.

## 2021-11-23 NOTE — Progress Notes (Signed)
    Procedures performed today:    Procedure: Real-time Ultrasound Guided injection of the left hip joint Device: Samsung HS60  Verbal informed consent obtained.  Time-out conducted.  Noted no overlying erythema, induration, or other signs of local infection.  Skin prepped in a sterile fashion.  Local anesthesia: Topical Ethyl chloride.  With sterile technique and under real time ultrasound guidance: Noted arthritic joint, 1 cc Kenalog 40, 2 cc lidocaine, 2 cc bupivacaine injected easily Completed without difficulty  Advised to call if fevers/chills, erythema, induration, drainage, or persistent bleeding.  Images permanently stored and available for review in PACS.  Impression: Technically successful ultrasound guided injection.  Independent interpretation of notes and tests performed by another provider:   None.  Brief History, Exam, Impression, and Recommendations:    Primary osteoarthritis of left hip This is a pleasant 75 year old male, known hip osteoarthritis, last treated in 2020 at [facility] with a hip joint injection under fluoroscope. He is having a recurrence of pain now, left groin, today we repeated his hip joint injection, left-sided with ultrasound guidance, home conditioning exercises given, updated x-rays today. Return to see me in 4 to 6 weeks as needed. Not interested in arthroplasty at this juncture.  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 Currently on Coumadin so we cannot use NSAIDs, arthritis from Tylenol minimally effective, pain predominantly at night so we will add tramadol for nighttime use and shoulder conditioning. In the 6-week follow-up we will consider injection.    ___________________________________________ Gwen Her. Dianah Field, M.D., ABFM., CAQSM. Primary Care and [place] Instructor of [place] of  [facility] of Medicine

## 2021-11-23 NOTE — Assessment & Plan Note (Addendum)
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 Currently on Coumadin so we cannot use NSAIDs, arthritis from Tylenol minimally effective, pain predominantly at night so we will add tramadol for nighttime use and shoulder conditioning. In the 6-week follow-up we will consider injection.

## 2021-11-24 ENCOUNTER — Encounter: Payer: Self-pay | Admitting: Sports Medicine

## 2021-11-24 ENCOUNTER — Encounter: Payer: Self-pay | Admitting: Medical-Surgical

## 2021-11-25 LAB — GIARDIA ANTIGEN
MICRO NUMBER:: 12697624
RESULT:: NOT DETECTED
SPECIMEN QUALITY:: ADEQUATE

## 2021-11-25 LAB — C. DIFFICILE GDH AND TOXIN A/B
GDH ANTIGEN: NOT DETECTED
MICRO NUMBER:: 12688636
SPECIMEN QUALITY:: ADEQUATE
TOXIN A AND B: NOT DETECTED

## 2021-11-25 LAB — CRYPTOSPORIDIUM ANTIGEN, EIA
Specimen Quality:: ADEQUATE
micro Number:: 12697631

## 2021-11-25 LAB — FECAL LACTOFERRIN, QUANT
Fecal Lactoferrin: NEGATIVE
MICRO NUMBER:: 12685795
SPECIMEN QUALITY:: ADEQUATE

## 2021-11-25 LAB — OVA AND PARASITE EXAMINATION
CONCENTRATE RESULT:: NONE SEEN
MICRO NUMBER:: 12685045
SPECIMEN QUALITY:: ADEQUATE
TRICHROME RESULT:: NONE SEEN

## 2021-11-25 LAB — FECAL FAT, QUALITATIVE: FECAL FAT, QUALITATIVE: NORMAL

## 2021-11-26 NOTE — Telephone Encounter (Signed)
Thank you for making me aware. I will speak with the lab tech regarding this valid concern.

## 2021-12-15 ENCOUNTER — Ambulatory Visit (INDEPENDENT_AMBULATORY_CARE_PROVIDER_SITE_OTHER): Payer: Medicare Other | Admitting: *Deleted

## 2021-12-15 ENCOUNTER — Other Ambulatory Visit: Payer: Self-pay

## 2021-12-15 DIAGNOSIS — Z5181 Encounter for therapeutic drug level monitoring: Secondary | ICD-10-CM | POA: Diagnosis not present

## 2021-12-15 DIAGNOSIS — I4891 Unspecified atrial fibrillation: Secondary | ICD-10-CM

## 2021-12-15 DIAGNOSIS — I824Y9 Acute embolism and thrombosis of unspecified deep veins of unspecified proximal lower extremity: Secondary | ICD-10-CM | POA: Diagnosis not present

## 2021-12-15 LAB — POCT INR: INR: 1.7 — AB (ref 2.0–3.0)

## 2021-12-15 NOTE — Patient Instructions (Signed)
Description   Today take 8.5mg  then continue taking Warfarin 6mg  daily.  Recheck INR in 4 weeks.  Anticoagulation Clinic [phone]

## 2022-01-04 ENCOUNTER — Other Ambulatory Visit: Payer: Self-pay

## 2022-01-04 ENCOUNTER — Ambulatory Visit: Payer: Medicare Other | Admitting: Sports Medicine

## 2022-01-04 DIAGNOSIS — M25512 Pain in left shoulder: Secondary | ICD-10-CM

## 2022-01-04 DIAGNOSIS — G8929 Other chronic pain: Secondary | ICD-10-CM | POA: Diagnosis not present

## 2022-01-04 DIAGNOSIS — M1612 Unilateral primary osteoarthritis, left hip: Secondary | ICD-10-CM | POA: Diagnosis not present

## 2022-01-04 NOTE — Assessment & Plan Note (Signed)
Left shoulder osteoarthritis, unable to use NSAIDs as he is on Coumadin, we added tramadol, overall he is doing okay, does not need an injection, if he changes his mind we would do a glenohumeral shot.

## 2022-01-04 NOTE — Progress Notes (Signed)
° ° °  Procedures performed today:    None.  Independent interpretation of notes and tests performed by another provider:   None.  Brief History, Exam, Impression, and Recommendations:    Primary osteoarthritis of left hip Larry Odonnell returns, he is a pleasant 76 year old male, known hip osteoarthritis, we last did an injection a month ago, he is doing well, still has some discomfort but it is livable.  Left shoulder pain Left shoulder osteoarthritis, unable to use NSAIDs as he is on Coumadin, we added tramadol, overall he is doing okay, does not need an injection, if he changes his mind we would do a glenohumeral shot.    ___________________________________________ Larry Odonnell, M.D., ABFM., CAQSM. Primary Care and [place] Instructor of [place] of [facility] of Medicine

## 2022-01-04 NOTE — Assessment & Plan Note (Signed)
Larry Odonnell returns, he is a pleasant 76 year old male, known hip osteoarthritis, we last did an injection a month ago, he is doing well, still has some discomfort but it is livable.

## 2022-01-12 ENCOUNTER — Ambulatory Visit: Payer: Medicare Other | Admitting: *Deleted

## 2022-01-12 ENCOUNTER — Other Ambulatory Visit: Payer: Self-pay

## 2022-01-12 DIAGNOSIS — I824Y9 Acute embolism and thrombosis of unspecified deep veins of unspecified proximal lower extremity: Secondary | ICD-10-CM | POA: Diagnosis not present

## 2022-01-12 DIAGNOSIS — I4891 Unspecified atrial fibrillation: Secondary | ICD-10-CM | POA: Diagnosis not present

## 2022-01-12 DIAGNOSIS — Z5181 Encounter for therapeutic drug level monitoring: Secondary | ICD-10-CM

## 2022-01-12 LAB — POCT INR: INR: 1.6 — AB (ref 2.0–3.0)

## 2022-01-12 NOTE — Patient Instructions (Signed)
Description   Take 8mg  of warfarin today and then START taking warfarin 6mg  daily except for 7mg  on Mondays, Wednesdays and Fridays. Recheck INR [phone]

## 2022-01-18 ENCOUNTER — Other Ambulatory Visit: Payer: Self-pay

## 2022-01-18 ENCOUNTER — Encounter: Payer: Self-pay | Admitting: Medical-Surgical

## 2022-01-18 DIAGNOSIS — E785 Hyperlipidemia, unspecified: Secondary | ICD-10-CM

## 2022-01-18 DIAGNOSIS — I4891 Unspecified atrial fibrillation: Secondary | ICD-10-CM

## 2022-01-18 DIAGNOSIS — Z5181 Encounter for therapeutic drug level monitoring: Secondary | ICD-10-CM

## 2022-01-18 DIAGNOSIS — Z79899 Other long term (current) drug therapy: Secondary | ICD-10-CM

## 2022-01-18 MED ORDER — METOPROLOL SUCCINATE ER 25 MG PO TB24: 25.0000 mg | ORAL_TABLET | Freq: Every day | ORAL | 1 refills | Status: DC

## 2022-01-18 MED ORDER — AMLODIPINE BESYLATE 5 MG PO TABS: 5.0000 mg | ORAL_TABLET | Freq: Every day | ORAL | 1 refills | Status: DC

## 2022-01-18 MED ORDER — WARFARIN SODIUM 5 MG PO TABS: ORAL_TABLET | ORAL | 1 refills | Status: DC

## 2022-01-18 MED ORDER — ROSUVASTATIN CALCIUM 10 MG PO TABS: 10.0000 mg | ORAL_TABLET | Freq: Every day | ORAL | 1 refills | Status: DC

## 2022-01-18 MED ORDER — LISINOPRIL 10 MG PO TABS: 10.0000 mg | ORAL_TABLET | Freq: Every day | ORAL | 1 refills | Status: DC

## 2022-01-18 MED ORDER — WARFARIN SODIUM 1 MG PO TABS: ORAL_TABLET | ORAL | 1 refills | Status: DC

## 2022-01-18 NOTE — Telephone Encounter (Signed)
See other MyChart message

## 2022-01-20 ENCOUNTER — Other Ambulatory Visit: Payer: Self-pay

## 2022-01-20 DIAGNOSIS — F418 Other specified anxiety disorders: Secondary | ICD-10-CM

## 2022-01-20 MED ORDER — CLONAZEPAM 0.5 MG PO TABS: 0.5000 mg | ORAL_TABLET | ORAL | 0 refills | Status: DC | PRN

## 2022-01-20 MED ORDER — VENLAFAXINE HCL ER 150 MG PO CP24: 150.0000 mg | ORAL_CAPSULE | Freq: Every day | ORAL | 0 refills | Status: DC

## 2022-01-20 NOTE — Progress Notes (Signed)
Pt requested a refill on his Klonopin and Effexor. These meds are listed but have never been prescribed by you. Pt due for mood follow up in Feb. Refills pended. Please send to [facility] per pt request.

## 2022-01-20 NOTE — Progress Notes (Signed)
Refills signed.   ___________________________________________ Clearnce Sorrel, DNP, APRN, FNP-BC Primary Care and Sports Medicine [place]

## 2022-01-25 ENCOUNTER — Telehealth: Payer: Self-pay

## 2022-01-25 NOTE — Telephone Encounter (Signed)
[facility] mail order pharmacy stating that pt has indicated an allergy to statins and has been prescribed Rosuvastatin 10 mg tablets. Pharmacy would like to know if Dr. Johney Frame is aware of the allergy/sensitivity and would Dr. Johney Frame like to continue prescribing this medication? Please address   Ph# [phone]  Order # [id number]

## 2022-01-25 NOTE — Telephone Encounter (Signed)
Yes, please refill this medication for he has been on this regimen for quite sometime.  We stopped his atorvastatin due to complaints of muscle aches back in Sept 2022 and switched him to this for he had never tried, and pt tolerated rosuvastatin well.  He should continue this regimen.  Can you please call them and inform them that Dr. Johney Frame wants him to continue this medication and please refill.  It is hard for [place] to call mail order pharmacies back in clinic and we do not want to delay in the pt receiving his rosuvastatin. Thanks!

## 2022-01-25 NOTE — Telephone Encounter (Signed)
Called pt and pt stated that he is doing well on rosuvastatin and that he will continue medication as prescribed by Dr. Johney Frame. Called [facility] mail order pharmacy to inform them as well that pt is taking rosuvastatin now and tolerating medication well and to dispense medication out to the pt. Pharmacy verbalized understanding.

## 2022-01-26 ENCOUNTER — Ambulatory Visit: Payer: Medicare Other | Admitting: *Deleted

## 2022-01-26 ENCOUNTER — Other Ambulatory Visit: Payer: Self-pay

## 2022-01-26 DIAGNOSIS — I4891 Unspecified atrial fibrillation: Secondary | ICD-10-CM

## 2022-01-26 DIAGNOSIS — Z5181 Encounter for therapeutic drug level monitoring: Secondary | ICD-10-CM

## 2022-01-26 DIAGNOSIS — I824Y9 Acute embolism and thrombosis of unspecified deep veins of unspecified proximal lower extremity: Secondary | ICD-10-CM

## 2022-01-26 LAB — POCT INR: INR: 2 (ref 2.0–3.0)

## 2022-01-26 NOTE — Patient Instructions (Signed)
Description   Continue taking warfarin 6mg  daily except for 7mg  on Mondays, Wednesdays and Fridays. Recheck INR in 4 weeks. [phone]

## 2022-02-09 ENCOUNTER — Ambulatory Visit: Payer: Medicare Other | Admitting: Medical-Surgical

## 2022-02-09 ENCOUNTER — Other Ambulatory Visit: Payer: Self-pay

## 2022-02-09 ENCOUNTER — Encounter: Payer: Self-pay | Admitting: Medical-Surgical

## 2022-02-09 VITALS — BP 109/74 | HR 87 | Resp 20 | Ht 72.0 in | Wt 195.1 lb

## 2022-02-09 DIAGNOSIS — A084 Viral intestinal infection, unspecified: Secondary | ICD-10-CM | POA: Diagnosis not present

## 2022-02-09 DIAGNOSIS — I10 Essential (primary) hypertension: Secondary | ICD-10-CM

## 2022-02-09 DIAGNOSIS — E782 Mixed hyperlipidemia: Secondary | ICD-10-CM

## 2022-02-09 DIAGNOSIS — F418 Other specified anxiety disorders: Secondary | ICD-10-CM | POA: Diagnosis not present

## 2022-02-09 MED ORDER — VENLAFAXINE HCL ER 150 MG PO CP24: 150.0000 mg | ORAL_CAPSULE | Freq: Every day | ORAL | 3 refills | Status: DC

## 2022-02-09 NOTE — Progress Notes (Signed)
°  HPI with pertinent ROS:   CC: mood follow up  HPI: Very pleasant 76 year old male presenting today for mood follow-up.  Has been doing very well on venlafaxine 150 mg daily and as needed clonazepam 0.5 mg daily as needed.  He is tolerating both medications well without side effects.  Has been treated with SSRIs or SNRIs for quite a few years and feels that his current treatment with venlafaxine helps keep his depressive symptoms stable.  Denies SI/HI.  Reports using clonazepam 1-2 times weekly.  Followed by cardiology for hypertension, hyperlipidemia, and Coumadin management.  Is up-to-date on his follow-ups with them.  Started feeling under the weather on Sunday into Monday.  Notes his wife has been sick with a GI illness involving projectile vomiting and diarrhea with fecal urgency.  He has been helping take care of her and feels that he may have gotten a GI bug.  He has not had any vomiting or significant nausea but he has had several episodes of diarrhea with fecal urgency.  He has been eating bland foods and working on symptom management using Pepto-Bismol.  Feels that he is on the mend.  He did COVID test on Monday to make sure with negative results.  I reviewed the past medical history, family history, social history, surgical history, and allergies today and no changes were needed.  Please see the problem list section below in epic for further details.   Physical exam:   General: Well Developed, well nourished, and in no acute distress.  Neuro: Alert and oriented x3.  HEENT: Normocephalic, atraumatic.  Skin: Warm and dry. Cardiac: Regular rate and rhythm, no murmurs rubs or gallops, no lower extremity edema.  Respiratory: Clear to auscultation bilaterally. Not using accessory muscles, speaking in full sentences.  Impression and Recommendations:    1. Primary hypertension Blood pressure at goal today on lisinopril 10 mg daily.  Managed by cardiology.  2. Anxiety with  depression Symptoms stable.  Continue venlafaxine 150 mg daily.  Continue very sparing use of clonazepam no more than 2-3 times weekly for severe anxiety.  Refills sent to pharmacy.  3. Mixed hyperlipidemia Taking rosuvastatin 10 mg daily  4.  Viral gastroenteritis Symptoms do appear to be improving on their own.  Recommend as needed use of Pepto-Bismol sparingly.  Follow a bland diet and advance as tolerated.  Stay well-hydrated to prevent dehydration.  Return in about 6 months (around 08/09/2022) for mood follow up. ___________________________________________ Clearnce Sorrel, DNP, APRN, FNP-BC Primary Care and [place]

## 2022-02-23 ENCOUNTER — Other Ambulatory Visit: Payer: Self-pay

## 2022-02-23 ENCOUNTER — Ambulatory Visit: Payer: Medicare Other | Admitting: *Deleted

## 2022-02-23 DIAGNOSIS — Z5181 Encounter for therapeutic drug level monitoring: Secondary | ICD-10-CM | POA: Diagnosis not present

## 2022-02-23 DIAGNOSIS — I824Y9 Acute embolism and thrombosis of unspecified deep veins of unspecified proximal lower extremity: Secondary | ICD-10-CM

## 2022-02-23 DIAGNOSIS — I4891 Unspecified atrial fibrillation: Secondary | ICD-10-CM | POA: Diagnosis not present

## 2022-02-23 LAB — POCT INR: INR: 6 — AB (ref 2.0–3.0)

## 2022-02-23 NOTE — Patient Instructions (Signed)
Description   ?Do not take any Warfarin Thursday, No Warfarin Friday, and No Warfarin Saturday then continue taking warfarin 6mg  daily except for 7mg  on Mondays, Wednesdays and Fridays. Recheck INR in 1 week. Anticoagulation Clinic [phone] ?  ?  ?

## 2022-02-27 ENCOUNTER — Encounter: Payer: Self-pay | Admitting: Cardiology

## 2022-02-28 NOTE — Telephone Encounter (Signed)
Freada Bergeron, MD  [name] Just now (12:48 PM)  ? ?HP ?Hi Mr. Camerer, ?  ?I am so sorry you are having these symptoms and you are completely right that your blood pressure is too low. Let's stop your amlodipine all together. If you are still having low blood pressures despite stopping this medication, we will decrease your lisinopril to '5mg'$  daily.  ?  ?Keep [place] posted and we hope you feel better! ?  ?Sincerely, ?Gwyndolyn Kaufman ? ? ? ?As indicated above by Dr. Johney Frame to the pt via mychart message, he is to stop taking amlodipine and continue to monitor his pressures.  He is advised to touch base with [place] on his readings.  Per Dr. Johney Frame, if BP still remains low with the stop of amlodipine, then we will decrease his lisinopril to 5 mg po daily.  Amlodipine discontinued from the pts med list.  ?

## 2022-03-03 ENCOUNTER — Ambulatory Visit: Payer: Medicare Other

## 2022-03-03 ENCOUNTER — Other Ambulatory Visit: Payer: Self-pay

## 2022-03-03 DIAGNOSIS — I824Y9 Acute embolism and thrombosis of unspecified deep veins of unspecified proximal lower extremity: Secondary | ICD-10-CM

## 2022-03-03 DIAGNOSIS — Z5181 Encounter for therapeutic drug level monitoring: Secondary | ICD-10-CM

## 2022-03-03 DIAGNOSIS — I4891 Unspecified atrial fibrillation: Secondary | ICD-10-CM

## 2022-03-03 LAB — POCT INR: INR: 1.5 — AB (ref 2.0–3.0)

## 2022-03-03 NOTE — Patient Instructions (Signed)
Description   ?Take an extra 0.5 of a '5mg'$  tablet (2.'5mg'$ ) today and then continue taking warfarin '6mg'$  daily except for '7mg'$  on Mondays, Wednesdays and Fridays.  ?Recheck INR in 1 week.  ?Anticoagulation Clinic [phone] ?  ?   ?

## 2022-03-11 ENCOUNTER — Other Ambulatory Visit: Payer: Self-pay

## 2022-03-11 ENCOUNTER — Ambulatory Visit (INDEPENDENT_AMBULATORY_CARE_PROVIDER_SITE_OTHER): Payer: Medicare Other

## 2022-03-11 DIAGNOSIS — Z5181 Encounter for therapeutic drug level monitoring: Secondary | ICD-10-CM | POA: Diagnosis not present

## 2022-03-11 DIAGNOSIS — I4891 Unspecified atrial fibrillation: Secondary | ICD-10-CM | POA: Diagnosis not present

## 2022-03-11 DIAGNOSIS — I2693 Single subsegmental pulmonary embolism without acute cor pulmonale: Secondary | ICD-10-CM | POA: Diagnosis not present

## 2022-03-11 DIAGNOSIS — I824Y9 Acute embolism and thrombosis of unspecified deep veins of unspecified proximal lower extremity: Secondary | ICD-10-CM | POA: Diagnosis not present

## 2022-03-11 LAB — POCT INR: INR: 2.4 (ref 2.0–3.0)

## 2022-03-11 NOTE — Patient Instructions (Signed)
-   continue taking warfarin '6mg'$  daily except for '7mg'$  on Mondays, Wednesdays and Fridays.  ?- Recheck INR in 4 weeks. ?Anticoagulation Clinic [phone] ?

## 2022-04-02 ENCOUNTER — Encounter: Payer: Self-pay | Admitting: Cardiology

## 2022-04-02 ENCOUNTER — Encounter: Payer: Self-pay | Admitting: Medical-Surgical

## 2022-04-04 ENCOUNTER — Other Ambulatory Visit: Payer: Self-pay | Admitting: Medical-Surgical

## 2022-04-04 DIAGNOSIS — L989 Disorder of the skin and subcutaneous tissue, unspecified: Secondary | ICD-10-CM

## 2022-04-11 ENCOUNTER — Ambulatory Visit (INDEPENDENT_AMBULATORY_CARE_PROVIDER_SITE_OTHER): Payer: Medicare Other | Admitting: *Deleted

## 2022-04-11 DIAGNOSIS — I824Y9 Acute embolism and thrombosis of unspecified deep veins of unspecified proximal lower extremity: Secondary | ICD-10-CM | POA: Diagnosis not present

## 2022-04-11 DIAGNOSIS — I2693 Single subsegmental pulmonary embolism without acute cor pulmonale: Secondary | ICD-10-CM

## 2022-04-11 DIAGNOSIS — Z5181 Encounter for therapeutic drug level monitoring: Secondary | ICD-10-CM | POA: Diagnosis not present

## 2022-04-11 DIAGNOSIS — I4891 Unspecified atrial fibrillation: Secondary | ICD-10-CM

## 2022-04-11 LAB — POCT INR: INR: 1.8 — AB (ref 2.0–3.0)

## 2022-04-11 NOTE — Patient Instructions (Signed)
Description   ?Today take '8mg'$  then continue taking warfarin '6mg'$  daily except for '7mg'$  on Mondays, Wednesdays and Fridays. Recheck INR in 4 weeks. Anticoagulation Clinic [phone] ?  ?  ?

## 2022-04-13 ENCOUNTER — Encounter: Payer: Self-pay | Admitting: Medical-Surgical

## 2022-04-13 DIAGNOSIS — F418 Other specified anxiety disorders: Secondary | ICD-10-CM

## 2022-04-14 ENCOUNTER — Other Ambulatory Visit: Payer: Self-pay | Admitting: Medical-Surgical

## 2022-04-14 DIAGNOSIS — F418 Other specified anxiety disorders: Secondary | ICD-10-CM

## 2022-04-14 MED ORDER — VENLAFAXINE HCL ER 75 MG PO CP24: 75.0000 mg | ORAL_CAPSULE | Freq: Every day | ORAL | 1 refills | Status: DC

## 2022-05-07 ENCOUNTER — Other Ambulatory Visit: Payer: Self-pay | Admitting: Medical-Surgical

## 2022-05-07 DIAGNOSIS — F418 Other specified anxiety disorders: Secondary | ICD-10-CM

## 2022-05-09 ENCOUNTER — Ambulatory Visit: Payer: Medicare Other

## 2022-05-09 DIAGNOSIS — Z5181 Encounter for therapeutic drug level monitoring: Secondary | ICD-10-CM

## 2022-05-09 DIAGNOSIS — I824Y9 Acute embolism and thrombosis of unspecified deep veins of unspecified proximal lower extremity: Secondary | ICD-10-CM | POA: Diagnosis not present

## 2022-05-09 DIAGNOSIS — I4891 Unspecified atrial fibrillation: Secondary | ICD-10-CM | POA: Diagnosis not present

## 2022-05-09 LAB — POCT INR: INR: 1.9 — AB (ref 2.0–3.0)

## 2022-05-09 NOTE — Patient Instructions (Signed)
Description   ?START taking '7mg'$  daily.   ?Stay consistent with greens each week.  ?Recheck INR in 3 weeks.  ?Anticoagulation Clinic [phone] ?  ?   ?

## 2022-05-31 ENCOUNTER — Ambulatory Visit: Payer: Medicare Other | Admitting: Pharmacist

## 2022-05-31 DIAGNOSIS — I2693 Single subsegmental pulmonary embolism without acute cor pulmonale: Secondary | ICD-10-CM | POA: Diagnosis not present

## 2022-05-31 DIAGNOSIS — I824Y9 Acute embolism and thrombosis of unspecified deep veins of unspecified proximal lower extremity: Secondary | ICD-10-CM | POA: Diagnosis not present

## 2022-05-31 DIAGNOSIS — Z5181 Encounter for therapeutic drug level monitoring: Secondary | ICD-10-CM | POA: Diagnosis not present

## 2022-05-31 DIAGNOSIS — I4891 Unspecified atrial fibrillation: Secondary | ICD-10-CM | POA: Diagnosis not present

## 2022-05-31 LAB — POCT INR: INR: 2.7 (ref 2.0–3.0)

## 2022-05-31 NOTE — Patient Instructions (Addendum)
Description   Continue taking taking '7mg'$  daily. Stay consistent with greens each week. Recheck INR in 5

## 2022-06-01 ENCOUNTER — Other Ambulatory Visit: Payer: Self-pay | Admitting: Medical-Surgical

## 2022-06-01 DIAGNOSIS — F418 Other specified anxiety disorders: Secondary | ICD-10-CM

## 2022-06-10 ENCOUNTER — Encounter: Payer: Self-pay | Admitting: Medical-Surgical

## 2022-06-10 DIAGNOSIS — F418 Other specified anxiety disorders: Secondary | ICD-10-CM

## 2022-06-10 MED ORDER — VENLAFAXINE HCL ER 75 MG PO CP24: 75.0000 mg | ORAL_CAPSULE | Freq: Every day | ORAL | 1 refills | Status: DC

## 2022-06-14 ENCOUNTER — Other Ambulatory Visit: Payer: Self-pay | Admitting: *Deleted

## 2022-06-14 MED ORDER — WARFARIN SODIUM 1 MG PO TABS: ORAL_TABLET | ORAL | 1 refills | Status: DC

## 2022-06-16 ENCOUNTER — Other Ambulatory Visit: Payer: Self-pay

## 2022-06-16 DIAGNOSIS — I4891 Unspecified atrial fibrillation: Secondary | ICD-10-CM

## 2022-06-16 DIAGNOSIS — Z79899 Other long term (current) drug therapy: Secondary | ICD-10-CM

## 2022-06-16 DIAGNOSIS — E785 Hyperlipidemia, unspecified: Secondary | ICD-10-CM

## 2022-06-16 DIAGNOSIS — Z5181 Encounter for therapeutic drug level monitoring: Secondary | ICD-10-CM

## 2022-06-16 MED ORDER — METOPROLOL SUCCINATE ER 25 MG PO TB24: 25.0000 mg | ORAL_TABLET | Freq: Every day | ORAL | 0 refills | Status: DC

## 2022-06-22 ENCOUNTER — Other Ambulatory Visit: Payer: Self-pay

## 2022-06-22 MED ORDER — ROSUVASTATIN CALCIUM 10 MG PO TABS: 10.0000 mg | ORAL_TABLET | Freq: Every day | ORAL | 0 refills | Status: DC

## 2022-07-01 ENCOUNTER — Other Ambulatory Visit: Payer: Self-pay

## 2022-07-01 DIAGNOSIS — Z5181 Encounter for therapeutic drug level monitoring: Secondary | ICD-10-CM

## 2022-07-01 DIAGNOSIS — E785 Hyperlipidemia, unspecified: Secondary | ICD-10-CM

## 2022-07-01 DIAGNOSIS — Z79899 Other long term (current) drug therapy: Secondary | ICD-10-CM

## 2022-07-01 DIAGNOSIS — I4891 Unspecified atrial fibrillation: Secondary | ICD-10-CM

## 2022-07-01 MED ORDER — METOPROLOL SUCCINATE ER 25 MG PO TB24: 25.0000 mg | ORAL_TABLET | Freq: Every day | ORAL | 0 refills | Status: DC

## 2022-07-03 ENCOUNTER — Encounter: Payer: Self-pay | Admitting: Medical-Surgical

## 2022-07-03 DIAGNOSIS — Z1211 Encounter for screening for malignant neoplasm of colon: Secondary | ICD-10-CM

## 2022-07-04 NOTE — Telephone Encounter (Signed)
Colonoscopy referral pended.

## 2022-07-05 ENCOUNTER — Ambulatory Visit: Payer: Medicare Other | Admitting: *Deleted

## 2022-07-05 DIAGNOSIS — I2693 Single subsegmental pulmonary embolism without acute cor pulmonale: Secondary | ICD-10-CM

## 2022-07-05 DIAGNOSIS — I824Y9 Acute embolism and thrombosis of unspecified deep veins of unspecified proximal lower extremity: Secondary | ICD-10-CM | POA: Diagnosis not present

## 2022-07-05 DIAGNOSIS — I4891 Unspecified atrial fibrillation: Secondary | ICD-10-CM

## 2022-07-05 DIAGNOSIS — Z5181 Encounter for therapeutic drug level monitoring: Secondary | ICD-10-CM

## 2022-07-05 LAB — POCT INR: INR: 2.6 (ref 2.0–3.0)

## 2022-07-05 NOTE — Patient Instructions (Signed)
Description   Continue taking taking '7mg'$  daily. Stay consistent with greens each week. Recheck INR in 6 weeks. Anticoagulation Clinic [phone] or [phone]

## 2022-07-10 NOTE — Progress Notes (Unsigned)
Cardiology Office Note:    Date:  07/12/2022   ID:  Larry Odonnell, DOB 1946/01/11, MRN [id number]  PCP:  Samuel Bouche, NP   [place]  Cardiologist:  None  Advanced Practice Provider:  No care team member to display Electrophysiologist:  None   Referring MD: Samuel Bouche, NP    History of Present Illness:    Larry Odonnell is a 76 y.o. 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  Patient initially presented in 2008 with RLE redness, swelling and pain. Doppler confirmed the presence of a  DVT. Evaluation suggested an idiopathic event and long-term warfarin was recommended. He ultimately decided to come off anticoagulation. He was doing well  until May 2020 when he was diagnosed with bilateral pulmonary embolism.   Specifically, he presented to [facility] urgent care on 05/22/2019 with left-sided chest pain radiating to the back and fever. He also noted progressive DOE. Chest x-ray was unremarkable but d-dimer was markedly elevated. He was referred to the ER to evaluate for pulmonary embolism. At [facility], CT PE revealed bilateral pulmonary embolism, predominantly in the lower lobe distribution. He was admitted 5/27-5/28/2020 for observation and discharged on apixaban '10mg'$  twice daily followed by '5mg'$  BID. He was advised to stop taking meloxicam, which he used for chronic hip pain. He had a very large out-of-pocket cost with apixaban and opted to switch to warfarin. Has been followed by Heme.  He was diagnosed with Afib last September 2021. States his only symptom at that time was that he noticed a rapid pulse rate and some fatigue. No palpitations, chest pain or SOB. Saw Cardiologist at [facility] and was started on metoprolol and amiodarone. Returned to rhythm prior DCCV  Has remained on amiodarone since that time. No recurrence of Afib since November that he knows of.  Notably underwent myoview stress test for DOE which was  negative for ischemia but showed small, fixed inferior wall defect (possible attenuation) and mildly reduced LVEF.  TTE 02/2022 showed normal LVEF, no significant valve disease  Was last seen in clinic 03/2021 where he was doing well from a CV standpoint. He continued to smoke 1/2 ppd of cigarettes.  Today, the patient overall feels well. He denies any chest pain, SOB, palpitations, lightheadedness, LE edema, orthopnea or PND. Tolerating warfarin without bleeding. Does not know he is in Afib today.    Past Medical History:  Diagnosis Date   A-fib [facility])    Allergy    Latex   Anxiety 2006   Cancer [facility]) 2012   Prostate   Cataract 2017   Surgery both eyes   Depression 2006   DVT (deep venous thrombosis) (HCC)    Hypertension    Mixed hyperlipidemia 08/09/2021   Primary osteoarthritis of left hip 11/23/2021   Pulmonary embolism [facility])     Past Surgical History:  Procedure Laterality Date   EYE SURGERY  2017   Cataracts   PROSTATECTOMY     TONSILLECTOMY      Current Medications: Current Meds  Medication Sig   Cholecalciferol (VITAMIN D3) 1.25 MG (50000 UT) CAPS Take 1 capsule by mouth daily.   clonazePAM (KLONOPIN) 0.5 MG tablet Take 1 tablet (0.5 mg total) by mouth as needed for anxiety.   lisinopril (ZESTRIL) 10 MG tablet Take 1 tablet (10 mg total) by mouth daily.   Melatonin 10 MG TABS Take 10 mg by mouth daily.  metoprolol succinate (TOPROL XL) 25 MG 24 hr tablet Take 1 tablet (25 mg total) by mouth daily. Please keep upcoming appt in July 2023 with Dr. Johney Frame before anymore refills. Thank you Final Attempt   Multiple Vitamin (MULTI-VITAMIN) tablet Take 1 tablet by mouth daily.   Omega-3 1000 MG CAPS Take 1,000 mg by mouth daily.   rosuvastatin (CRESTOR) 10 MG tablet Take 1 tablet (10 mg total) by mouth daily.   warfarin (COUMADIN) 1 MG tablet Take 1 to 2 tablets by mouth once daily along with '5mg'$  tablet or as directed by Coumadin clinic   warfarin (COUMADIN) 5 MG  tablet Take 5 mg tablet by mouth once daily along with '1mg'$  tablet or as directed by the coumadin clinic     Allergies:   Atorvastatin, Latex, and Oxycodone   Social History   Socioeconomic History   Marital status: Married    Spouse name: Not on file   Number of children: Not on file   Years of education: Not on file   Highest education level: Not on file  Occupational History   Occupation: Retired  Tobacco Use   Smoking status: Light Smoker    Packs/day: 0.50    Years: 10.00    Total pack years: 5.00    Types: Cigarettes   Smokeless tobacco: Never   Tobacco comments:    Quit [id number]  Vaping Use   Vaping Use: Never used  Substance and Sexual Activity   Alcohol use: Not Currently    Comment: No alcohol since 1995   Drug use: Never   Sexual activity: Not Currently    Birth control/protection: None  Other Topics Concern   Not on file  Social History Narrative   Not on file   Social Determinants of Health   Financial Resource Strain: Not on file  Food Insecurity: Not on file  Transportation Needs: Not on file  Physical Activity: Not on file  Stress: Not on file  Social Connections: Not on file     Family History: The patient's family history includes Alcohol abuse in his father; Anxiety disorder in his brother, daughter, and mother; Cancer in his mother; Depression in his brother and daughter; Early death in his father; Stroke in his mother.  ROS:   Please see the history of present illness.  (+) Minor bruising.   All other systems reviewed and are negative.  EKGs/Labs/Other Studies Reviewed:    The following studies were reviewed today: TTE April 17, 2021: IMPRESSIONS   1. Left ventricular ejection fraction, by estimation, is 55 to 60%. Left  ventricular ejection fraction by 3D volume is 58 %. The left ventricle has  normal function. The left ventricle has no regional wall motion  abnormalities. Left ventricular diastolic   parameters were normal.   2. Right  ventricular systolic function is normal. The right ventricular  size is normal. There is normal pulmonary artery systolic pressure. The  estimated right ventricular systolic pressure is 64.3 mmHg.   3. The mitral valve is grossly normal. Trivial mitral valve  regurgitation. No evidence of mitral stenosis.   4. The aortic valve is tricuspid. Aortic valve regurgitation is not  visualized. No aortic stenosis is present.   5. The inferior vena cava is normal in size with greater than 50%  respiratory variability, suggesting right atrial pressure of 3 mmHg.  Myoview 10/2020: IMPRESSIONS & RECOMMENDATIONS There is a small, fixed inferior wall defect of mild severity, without definite evidence of ischemia. Diaphragmatic attenuation artifact present. Atrial fibrillation  present throughout study Recommend clinical correlation. Cardiac History: Hypertension, hyperlipidemia, tobacco abuse, A-Fib, DVT, PE.  No previous studies.   LV EF & Wall Motion: Mildly dilated LV size with mild LV systolic dysfunction, with mild global hypokinesis.  ABI 03/09/21: Summary:  Right: Resting right ankle-brachial index is within normal range. No  evidence of significant right lower extremity arterial disease. The right  toe-brachial index is normal.   Left: Resting left ankle-brachial index is within normal range. No  evidence of significant left lower extremity arterial disease. The left  toe-brachial index is normal.   EKG:   04/13/2021: EKG was not ordered today.  Recent Labs: 11/15/2021: ALT 22; BUN 14; Creat 0.93; Hemoglobin 15.3; Platelets 220; Potassium 4.7; Sodium 139; TSH 1.46  Recent Lipid Panel    Component Value Date/Time   CHOL 166 06/01/2021 0915   TRIG 76 06/01/2021 0915   HDL 46 06/01/2021 0915   CHOLHDL 3.6 06/01/2021 0915   LDLCALC 106 (H) 06/01/2021 0915     Risk Assessment/Calculations:    CHA2DS2-VASc Score =    This indicates a  % annual risk of stroke. The patient's score is  based upon:     Physical Exam:    VS:  BP 110/60 (BP Location: Left Arm, Patient Position: Sitting, Cuff Size: Normal)   Pulse (!) 126   Ht 6' (1.829 m)   Wt 201 lb (91.2 kg)   SpO2 96%   BMI 27.26 kg/m     Wt Readings from Last 3 Encounters:  07/12/22 201 lb (91.2 kg)  02/09/22 195 lb 1.9 oz (88.5 kg)  11/15/21 204 lb 14.4 oz (92.9 kg)     GEN: Well nourished, well developed in no acute distress HEENT: Normal NECK: No JVD; No carotid bruits LYMPHATICS: No lymphadenopathy CARDIAC: Bradycardic, regular, no murmurs, rubs, gallops RESPIRATORY:  Clear to auscultation without rales, wheezing or rhonchi  ABDOMEN: Soft, non-tender, non-distended MUSCULOSKELETAL:  No edema; No deformity  SKIN: Warm and dry NEUROLOGIC:  Alert and oriented x 3 PSYCHIATRIC:  Normal affect   ASSESSMENT:    No diagnosis found.  PLAN:    In order of problems listed above:  #Parosyxmal Afib: Previously followed by [facility] cardiology. CHADs-vasc 3. On warfarin for AC and metoprolol and amiodarone for rate control. Minimally symptomatic while in Afib. Currently in rhythm today and believes he has not had recurrence of Afib since 10/2020. Was planned for DCCV at [facility] but converted prior to the procedure. 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 Has remained active and walks without issues. Notably has started smoking again which may be contributing and continues to smoke today. Myoview in 10/2020 with no ischemia; possible infarct vs attenuation defect inferiorly. Normal EF on TTE with no valve abnormalities -Myoview  at OSH negative for ischemia -TTE with normal BiV function, no significant valve disease -Smoking cessation counseling provided   #Bilateral LE Pain: Patient with bilateral lower extremity achiness that has been ongoing for several months. Atypical in nature and does not seem to worsen with exercise but he notes his legs feel more faigued and weak with walking. ABI negative for obstruction -ABIs negative -Continue  exercise   #HTN: Well controlled today. -Continue amlopdine '5mg'$  daily -Continue lisinopril '10mg'$  daily -Encouraged compliance -Will follow-up with PCP   #Tobacco Use: -Tobacco cessation counseling  #Risk stratification: -Check lipids    Medication Adjustments/Labs and Tests Ordered: Current medicines are reviewed at length with the patient today.  Concerns regarding medicines are outlined above.  No orders of the defined types were placed in this encounter.  No orders of the defined types were placed in this encounter.   There are no Patient Instructions on file for this visit.   Follow-up in 6 months.    Signed, Freada Bergeron, MD  07/12/2022 9:17 AM    [place]

## 2022-07-12 ENCOUNTER — Ambulatory Visit: Payer: Medicare Other | Admitting: Cardiology

## 2022-07-12 ENCOUNTER — Ambulatory Visit (INDEPENDENT_AMBULATORY_CARE_PROVIDER_SITE_OTHER): Payer: Medicare Other

## 2022-07-12 ENCOUNTER — Ambulatory Visit: Payer: Medicare Other | Admitting: Sports Medicine

## 2022-07-12 ENCOUNTER — Encounter: Payer: Self-pay | Admitting: Cardiology

## 2022-07-12 ENCOUNTER — Encounter: Payer: Self-pay | Admitting: Sports Medicine

## 2022-07-12 VITALS — BP 110/60 | HR 126 | Ht 72.0 in | Wt 201.0 lb

## 2022-07-12 DIAGNOSIS — E785 Hyperlipidemia, unspecified: Secondary | ICD-10-CM

## 2022-07-12 DIAGNOSIS — M1612 Unilateral primary osteoarthritis, left hip: Secondary | ICD-10-CM

## 2022-07-12 DIAGNOSIS — M79604 Pain in right leg: Secondary | ICD-10-CM

## 2022-07-12 DIAGNOSIS — I824Y9 Acute embolism and thrombosis of unspecified deep veins of unspecified proximal lower extremity: Secondary | ICD-10-CM

## 2022-07-12 DIAGNOSIS — Z720 Tobacco use: Secondary | ICD-10-CM

## 2022-07-12 DIAGNOSIS — Z01812 Encounter for preprocedural laboratory examination: Secondary | ICD-10-CM | POA: Diagnosis not present

## 2022-07-12 DIAGNOSIS — M79605 Pain in left leg: Secondary | ICD-10-CM

## 2022-07-12 DIAGNOSIS — I4891 Unspecified atrial fibrillation: Secondary | ICD-10-CM

## 2022-07-12 DIAGNOSIS — I10 Essential (primary) hypertension: Secondary | ICD-10-CM

## 2022-07-12 DIAGNOSIS — I2699 Other pulmonary embolism without acute cor pulmonale: Secondary | ICD-10-CM

## 2022-07-12 MED ORDER — METOPROLOL SUCCINATE ER 25 MG PO TB24: 37.5000 mg | ORAL_TABLET | Freq: Every day | ORAL | 2 refills | Status: DC

## 2022-07-12 NOTE — Assessment & Plan Note (Signed)
Pleasant 76 year old male, known hip osteoarthritis, last injected December 2022, recurrence of pain, reinjection today, return as needed.

## 2022-07-12 NOTE — Progress Notes (Signed)
Cardiology Office Note:    Date:  07/12/2022   ID:  Larry Odonnell, DOB [birth year], MRN [id number]  PCP:  Samuel Bouche, NP   [place]  Cardiologist:  None  Advanced Practice Provider:  No care team member to display Electrophysiologist:  None   Referring MD: Samuel Bouche, NP    History of Present Illness:    Larry Odonnell is a 76 y.o. 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  Patient initially presented in 2008 with RLE redness, swelling and pain. Doppler confirmed the presence of a  DVT. Evaluation suggested an idiopathic event and long-term warfarin was recommended. He ultimately decided to come off anticoagulation. He was doing well  until May 2020 when he was diagnosed with bilateral pulmonary embolism.   Specifically, he presented to [facility] urgent care on 05/22/2019 with left-sided chest pain radiating to the back and fever. He also noted progressive DOE. Chest x-ray was unremarkable but d-dimer was markedly elevated. He was referred to the ER to evaluate for pulmonary embolism. At [facility], CT PE revealed bilateral pulmonary embolism, predominantly in the lower lobe distribution. He was admitted 5/27-5/28/2020 for observation and discharged on apixaban '10mg'$  twice daily followed by '5mg'$  BID. He was advised to stop taking meloxicam, which he used for chronic hip pain. He had a very large out-of-pocket cost with apixaban and opted to switch to warfarin. Has been followed by Heme.  He was diagnosed with Afib last September 2021. States his only symptom at that time was that he noticed a rapid pulse rate and some fatigue. No palpitations, chest pain or SOB. Saw Cardiologist at [facility] and was started on metoprolol and amiodarone. Returned to rhythm prior DCCV  Has remained on amiodarone since that time. No recurrence of Afib since November that he knows of.  Notably underwent myoview stress test for DOE which was  negative for ischemia but showed small, fixed inferior wall defect (possible attenuation) and mildly reduced LVEF.  TTE 02/2022 showed normal LVEF, no significant valve disease  Was last seen in clinic 03/2021 where he was doing well from a CV standpoint. He continued to smoke 1/2 ppd of cigarettes.  Today, he overall has been well. He states he was unaware he was in Afib today and has no palpitations, chest pain, SOB or decreased exercise capacity.  Has been tolerating warfarin without bleeding. INR has been therapeutic since 04/2022. He states he would like to minimize medications as much as possible and is interested in an ablation going forward.    Past Medical History:  Diagnosis Date   A-fib [facility])    Allergy    Latex   Anxiety 2006   Cancer [facility]) 2012   Prostate   Cataract 2017   Surgery both eyes   Depression 2006   DVT (deep venous thrombosis) (HCC)    Hypertension    Mixed hyperlipidemia 08/09/2021   Primary osteoarthritis of left hip 11/23/2021   Pulmonary embolism [facility])     Past Surgical History:  Procedure Laterality Date   EYE SURGERY  2017   Cataracts   PROSTATECTOMY     TONSILLECTOMY      Current Medications: Current Meds  Medication Sig   Cholecalciferol (VITAMIN D3) 1.25 MG (50000 UT) CAPS Take 1 capsule by mouth daily.   clonazePAM (KLONOPIN) 0.5 MG tablet Take 1 tablet (0.5 mg total) by mouth as needed for anxiety.  lisinopril (ZESTRIL) 10 MG tablet Take 1 tablet (10 mg total) by mouth daily.   Melatonin 10 MG TABS Take 10 mg by mouth daily.   metoprolol succinate (TOPROL XL) 25 MG 24 hr tablet Take 1.5 tablets (37.5 mg total) by mouth daily.   Multiple Vitamin (MULTI-VITAMIN) tablet Take 1 tablet by mouth daily.   Omega-3 1000 MG CAPS Take 1,000 mg by mouth daily.   rosuvastatin (CRESTOR) 10 MG tablet Take 1 tablet (10 mg total) by mouth daily.   warfarin (COUMADIN) 1 MG tablet Take 1 to 2 tablets by mouth once daily along with '5mg'$  tablet or as  directed by Coumadin clinic   warfarin (COUMADIN) 5 MG tablet Take 5 mg tablet by mouth once daily along with '1mg'$  tablet or as directed by the coumadin clinic   [DISCONTINUED] metoprolol succinate (TOPROL XL) 25 MG 24 hr tablet Take 1 tablet (25 mg total) by mouth daily. Please keep upcoming appt in July 2023 with Dr. Johney Frame before anymore refills. Thank you Final Attempt     Allergies:   Atorvastatin, Latex, and Oxycodone   Social History   Socioeconomic History   Marital status: Married    Spouse name: Not on file   Number of children: Not on file   Years of education: Not on file   Highest education level: Not on file  Occupational History   Occupation: Retired  Tobacco Use   Smoking status: Light Smoker    Packs/day: 0.50    Years: 10.00    Total pack years: 5.00    Types: Cigarettes   Smokeless tobacco: Never   Tobacco comments:    Quit [id number]  Vaping Use   Vaping Use: Never used  Substance and Sexual Activity   Alcohol use: Not Currently    Comment: No alcohol since 1995   Drug use: Never   Sexual activity: Not Currently    Birth control/protection: None  Other Topics Concern   Not on file  Social History Narrative   Not on file   Social Determinants of Health   Financial Resource Strain: Not on file  Food Insecurity: Not on file  Transportation Needs: Not on file  Physical Activity: Not on file  Stress: Not on file  Social Connections: Not on file     Family History: The patient's family history includes Alcohol abuse in his father; Anxiety disorder in his brother, daughter, and mother; Cancer in his mother; Depression in his brother and daughter; Early death in his father; Stroke in his mother.  ROS:   Please see the history of present illness.  (+) Unsteadiness (+) Palpitations All other systems reviewed and are negative.  EKGs/Labs/Other Studies Reviewed:    The following studies were reviewed today: TTE Apr 15, 2021: IMPRESSIONS   1. Left  ventricular ejection fraction, by estimation, is 55 to 60%. Left  ventricular ejection fraction by 3D volume is 58 %. The left ventricle has  normal function. The left ventricle has no regional wall motion  abnormalities. Left ventricular diastolic   parameters were normal.   2. Right ventricular systolic function is normal. The right ventricular  size is normal. There is normal pulmonary artery systolic pressure. The  estimated right ventricular systolic pressure is 32.2 mmHg.   3. The mitral valve is grossly normal. Trivial mitral valve  regurgitation. No evidence of mitral stenosis.   4. The aortic valve is tricuspid. Aortic valve regurgitation is not  visualized. No aortic stenosis is present.   5. The inferior  vena cava is normal in size with greater than 50%  respiratory variability, suggesting right atrial pressure of 3 mmHg.  Myoview 10/2020: IMPRESSIONS & RECOMMENDATIONS There is a small, fixed inferior wall defect of mild severity, without definite evidence of ischemia. Diaphragmatic attenuation artifact present. Atrial fibrillation present throughout study Recommend clinical correlation. Cardiac History: Hypertension, hyperlipidemia, tobacco abuse, A-Fib, DVT, PE.  No previous studies.   LV EF & Wall Motion: Mildly dilated LV size with mild LV systolic dysfunction, with mild global hypokinesis.  ABI 03/09/21: Summary:  Right: Resting right ankle-brachial index is within normal range. No  evidence of significant right lower extremity arterial disease. The right  toe-brachial index is normal.   Left: Resting left ankle-brachial index is within normal range. No  evidence of significant left lower extremity arterial disease. The left  toe-brachial index is normal.   EKG:   07/12/2022 EKG: Afib with RVR with HR 126 04/13/2021: EKG was not ordered today.  Recent Labs: 11/15/2021: ALT 22; BUN 14; Creat 0.93; Hemoglobin 15.3; Platelets 220; Potassium 4.7; Sodium 139; TSH 1.46   Recent Lipid Panel    Component Value Date/Time   CHOL 166 06/01/2021 0915   TRIG 76 06/01/2021 0915   HDL 46 06/01/2021 0915   CHOLHDL 3.6 06/01/2021 0915   LDLCALC 106 (H) 06/01/2021 0915     Risk Assessment/Calculations:    CHA2DS2-VASc Score = 4  This indicates a 4.8% annual risk of stroke. The patient's score is based upon: CHF History: 0 HTN History: 1 Diabetes History: 0 Stroke History: 0 Vascular Disease History: 1 Age Score: 2 Gender Score: 0    Physical Exam:    VS:  BP 110/60 (BP Location: Left Arm, Patient Position: Sitting, Cuff Size: Normal)   Pulse (!) 126   Ht 6' (1.829 m)   Wt 201 lb (91.2 kg)   SpO2 96%   BMI 27.26 kg/m     Wt Readings from Last 3 Encounters:  07/12/22 201 lb (91.2 kg)  02/09/22 195 lb 1.9 oz (88.5 kg)  11/15/21 204 lb 14.4 oz (92.9 kg)     GEN: Well nourished, well developed in no acute distress HEENT: Normal NECK: No JVD; No carotid bruits CARDIAC: Tachycardic, irregular, no murmurs rubs or gallops RESPIRATORY:  Clear to auscultation without rales, wheezing or rhonchi  ABDOMEN: Soft, non-tender, non-distended MUSCULOSKELETAL: No edema; No deformity  SKIN: Warm and dry NEUROLOGIC:  Alert and oriented x 3 PSYCHIATRIC:  Normal affect   ASSESSMENT:    1. Atrial fibrillation, unspecified type ([place])   2. Pre-procedure lab exam   3. Acute venous embolism and thrombosis of deep vessels of proximal lower extremity, unspecified laterality ([place])   4. Hyperlipidemia, unspecified hyperlipidemia type   5. Pulmonary embolism, other, unspecified chronicity, unspecified whether acute cor pulmonale present ([place])   6. Bilateral leg pain   7. Tobacco abuse   8. Primary hypertension     PLAN:    In order of problems listed above:  #Parosyxmal Afib: Previously followed by [facility] cardiology. CHADs-vasc 3. TTE with normal BiV function, no significant valve disease. On warfarin for AC and metoprolol for rate control. Currently in Afib  with RVR with no significant symptoms. Will plan for DCCV if remains in Afib. Will also refer to EP for consideration of an ablation per patient preference.  -Plan for DCCV  on 07/21/22 -Will check ECG prior to ensure still in Afib -Continue warfarin for AC--follows at anticoagulation clinic -Increase metop to 37.'5mg'$  XL daily -Refer  to EP for consideration of ablation.   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 Has remained active and walks without issues. Likely related to ongoing tobacco use. Myoview in 10/2020 with no ischemia; possible infarct vs attenuation defect inferiorly. Normal EF on TTE with no valve abnormalities -Myoview at OSH negative for ischemia -TTE with normal BiV function, no significant valve disease -Smoking cessation counseling provided   #Bilateral LE Pain: Improved. ABI negative -ABIs negative -Continue exercise   #HTN: Well controlled today. -Continue lisinopril '10mg'$  daily -Increase metop to 37.'5mg'$  XL daily   #Tobacco Use: -Tobacco cessation counseling  #Risk stratification: -Check lipids    Medication Adjustments/Labs and Tests Ordered: Current medicines are reviewed at length with the patient today.  Concerns regarding medicines are outlined above.  Orders Placed This Encounter  Procedures   CBC w/Diff   Basic metabolic panel   Protime-INR   Ambulatory referral to Cardiac Electrophysiology   EKG 12-Lead   Meds ordered this encounter  Medications   metoprolol succinate (TOPROL XL) 25 MG 24 hr tablet    Sig: Take 1.5 tablets (37.5 mg total) by mouth daily.    Dispense:  135 tablet    Refill:  2    Dose increase    Patient Instructions  Medication Instructions:   INCREASE YOUR METOPROLOL SUCCINATE TO 37.5 MG BY MOUTH DAILY  *If you need a  refill on your cardiac medications before your next appointment, please call your pharmacy*   You have been referred to SEE AN ELECTROPHYSIOLOGIST FOR CONSIDERATION OF AFIB ABLATION    Lab Work:  NEXT [facility] 7/26 --PRE-PROCEDURE LABS--BMET, PT/INR, AND CBC W DIFF--YOU WILL NEED NURSE VISIT EKG SAME DAY AS THIS APPOINTMENT  If you have labs (blood work) drawn today and your tests are completely normal, you will receive your results only by: [place] (if you have MyChart) OR A paper copy in the mail If you have any lab test that is abnormal or we need to change your treatment, we will call you to review the results.   Testing/Procedures:   You are scheduled for a Cardioversion on NEXT THURSDAY 07/21/22 with Dr. Johnsie Cancel.  Please arrive at the [facility] (Main Entrance A) at [facility]: [address] [place], [place] [address] at 8 am. (1 hour prior to procedure unless lab work is needed; if lab work is needed arrive 1.5 hours ahead)  DIET: Nothing to eat or drink after midnight except a sip of water with medications (see medication instructions below)  FYI: For your safety, and to allow [place] to monitor your vital signs accurately during the surgery/procedure we request that   if you have artificial nails, gel coating, SNS etc. Please have those removed prior to your surgery/procedure. Not having the nail coverings /polish removed may result in cancellation or delay of your surgery/procedure.   Medication Instructions: Continue your anticoagulant: COUMADIN AS DIRECTED--WE WILL CHECK YOUR INR AT LAB APPT ON 7/26 You will need to continue your anticoagulant after your procedure until you are told by your  Provider that it is safe to stop   Labs:   Come to: LAB ON 07/20/22 HERE IN THE OFFICE--WE WILL CHECK PT/INR, BMET, AND CBC W DIFF   You must have a responsible person to drive you home and stay in the  waiting area during your procedure. Failure to do so could result  in cancellation.  Bring your insurance cards.  *Special Note: Every effort is made to have your procedure done on time. Occasionally there are emergencies that occur at the hospital that may cause delays. Please be patient if a delay does occur.     Follow-Up:  1.) PATIENT WILL NEED EKG NURSE VISIT AND LAB APPOINTMENT SAME DAY ON 07/20/22 PER DR. Dutch Gray CONFIRM IF STILL IN AFIB OR NOTE BEFORE CARDIOVERSION ON 07/21/22  2.)  6 MONTHS IN THE OFFICE WITH DR. Johney Frame            Follow-up in 6 months.   I,Tinashe Williams,acting as a Education administrator for Freada Bergeron, MD.,have documented all relevant documentation on the behalf of Freada Bergeron, MD,as directed by  Freada Bergeron, MD while in the presence of Freada Bergeron, MD.   I, Freada Bergeron, MD, have reviewed all documentation for this visit. The documentation on 07/12/22 for the exam, diagnosis, procedures, and orders are all accurate and complete.

## 2022-07-12 NOTE — Patient Instructions (Signed)
Medication Instructions:   INCREASE YOUR METOPROLOL SUCCINATE TO 37.5 MG BY MOUTH DAILY  *If you need a refill on your cardiac medications before your next appointment, please call your pharmacy*   You have been referred to SEE AN ELECTROPHYSIOLOGIST FOR CONSIDERATION OF AFIB ABLATION    Lab Work:  NEXT [facility] 7/26 --PRE-PROCEDURE LABS--BMET, PT/INR, AND CBC W DIFF--YOU WILL NEED NURSE VISIT EKG SAME DAY AS THIS APPOINTMENT  If you have labs (blood work) drawn today and your tests are completely normal, you will receive your results only by: [place] (if you have MyChart) OR A paper copy in the mail If you have any lab test that is abnormal or we need to change your treatment, we will call you to review the results.   Testing/Procedures:   You are scheduled for a Cardioversion on NEXT THURSDAY 07/21/22 with Dr. Johnsie Cancel.  Please arrive at the [facility] (Main Entrance A) at [facility]: [address] [place], [place] [address] at 8 am. (1 hour prior to procedure unless lab work is needed; if lab work is needed arrive 1.5 hours ahead)  DIET: Nothing to eat or drink after midnight except a sip of water with medications (see medication instructions below)  FYI: For your safety, and to allow [place] to monitor your vital signs accurately during the surgery/procedure we request that   if you have artificial nails, gel coating, SNS etc. Please have those removed prior to your surgery/procedure. Not having the nail coverings /polish removed may result in cancellation or delay of your surgery/procedure.   Medication Instructions: Continue your anticoagulant: COUMADIN AS DIRECTED--WE WILL CHECK YOUR INR AT LAB APPT ON 7/26 You will need to continue your anticoagulant after your procedure until you are told by your  Provider that it is safe to stop   Labs:   Come to: LAB ON 07/20/22 HERE IN THE OFFICE--WE WILL CHECK PT/INR, BMET, AND CBC W DIFF   You must have a responsible  person to drive you home and stay in the waiting area during your procedure. Failure to do so could result in cancellation.  Bring your insurance cards.  *Special Note: Every effort is made to have your procedure done on time. Occasionally there are emergencies that occur at the hospital that may cause delays. Please be patient if a delay does occur.     Follow-Up:  1.) PATIENT WILL NEED EKG NURSE VISIT AND LAB APPOINTMENT SAME DAY ON 07/20/22 PER DR. Dutch Gray CONFIRM IF STILL IN AFIB OR NOTE BEFORE CARDIOVERSION ON 07/21/22  2.)  6 MONTHS IN THE OFFICE WITH DR. Johney Frame

## 2022-07-12 NOTE — H&P (View-Only) (Signed)
Cardiology Office Note:    Date:  07/12/2022   ID:  Larry Odonnell, DOB [birth year], MRN [id number]  PCP:  Samuel Bouche, NP   [place]  Cardiologist:  None  Advanced Practice Provider:  No care team member to display Electrophysiologist:  None   Referring MD: Samuel Bouche, NP    History of Present Illness:    Larry Odonnell is a 76 y.o. 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  Patient initially presented in 2008 with RLE redness, swelling and pain. Doppler confirmed the presence of a  DVT. Evaluation suggested an idiopathic event and long-term warfarin was recommended. He ultimately decided to come off anticoagulation. He was doing well  until May 2020 when he was diagnosed with bilateral pulmonary embolism.   Specifically, he presented to [facility] urgent care on 05/22/2019 with left-sided chest pain radiating to the back and fever. He also noted progressive DOE. Chest x-ray was unremarkable but d-dimer was markedly elevated. He was referred to the ER to evaluate for pulmonary embolism. At [facility], CT PE revealed bilateral pulmonary embolism, predominantly in the lower lobe distribution. He was admitted 5/27-5/28/2020 for observation and discharged on apixaban '10mg'$  twice daily followed by '5mg'$  BID. He was advised to stop taking meloxicam, which he used for chronic hip pain. He had a very large out-of-pocket cost with apixaban and opted to switch to warfarin. Has been followed by Heme.  He was diagnosed with Afib last September 2021. States his only symptom at that time was that he noticed a rapid pulse rate and some fatigue. No palpitations, chest pain or SOB. Saw Cardiologist at [facility] and was started on metoprolol and amiodarone. Returned to rhythm prior DCCV  Has remained on amiodarone since that time. No recurrence of Afib since November that he knows of.  Notably underwent myoview stress test for DOE which was  negative for ischemia but showed small, fixed inferior wall defect (possible attenuation) and mildly reduced LVEF.  TTE 02/2022 showed normal LVEF, no significant valve disease  Was last seen in clinic 03/2021 where he was doing well from a CV standpoint. He continued to smoke 1/2 ppd of cigarettes.  Today, he overall has been well. He states he was unaware he was in Afib today and has no palpitations, chest pain, SOB or decreased exercise capacity.  Has been tolerating warfarin without bleeding. INR has been therapeutic since 04/2022. He states he would like to minimize medications as much as possible and is interested in an ablation going forward.    Past Medical History:  Diagnosis Date   A-fib [facility])    Allergy    Latex   Anxiety 2006   Cancer [facility]) 2012   Prostate   Cataract 2017   Surgery both eyes   Depression 2006   DVT (deep venous thrombosis) (HCC)    Hypertension    Mixed hyperlipidemia 08/09/2021   Primary osteoarthritis of left hip 11/23/2021   Pulmonary embolism [facility])     Past Surgical History:  Procedure Laterality Date   EYE SURGERY  2017   Cataracts   PROSTATECTOMY     TONSILLECTOMY      Current Medications: Current Meds  Medication Sig   Cholecalciferol (VITAMIN D3) 1.25 MG (50000 UT) CAPS Take 1 capsule by mouth daily.   clonazePAM (KLONOPIN) 0.5 MG tablet Take 1 tablet (0.5 mg total) by mouth as needed for anxiety.  lisinopril (ZESTRIL) 10 MG tablet Take 1 tablet (10 mg total) by mouth daily.   Melatonin 10 MG TABS Take 10 mg by mouth daily.   metoprolol succinate (TOPROL XL) 25 MG 24 hr tablet Take 1.5 tablets (37.5 mg total) by mouth daily.   Multiple Vitamin (MULTI-VITAMIN) tablet Take 1 tablet by mouth daily.   Omega-3 1000 MG CAPS Take 1,000 mg by mouth daily.   rosuvastatin (CRESTOR) 10 MG tablet Take 1 tablet (10 mg total) by mouth daily.   warfarin (COUMADIN) 1 MG tablet Take 1 to 2 tablets by mouth once daily along with '5mg'$  tablet or as  directed by Coumadin clinic   warfarin (COUMADIN) 5 MG tablet Take 5 mg tablet by mouth once daily along with '1mg'$  tablet or as directed by the coumadin clinic   [DISCONTINUED] metoprolol succinate (TOPROL XL) 25 MG 24 hr tablet Take 1 tablet (25 mg total) by mouth daily. Please keep upcoming appt in July 2023 with Dr. Johney Frame before anymore refills. Thank you Final Attempt     Allergies:   Atorvastatin, Latex, and Oxycodone   Social History   Socioeconomic History   Marital status: Married    Spouse name: Not on file   Number of children: Not on file   Years of education: Not on file   Highest education level: Not on file  Occupational History   Occupation: Retired  Tobacco Use   Smoking status: Light Smoker    Packs/day: 0.50    Years: 10.00    Total pack years: 5.00    Types: Cigarettes   Smokeless tobacco: Never   Tobacco comments:    Quit [id number]  Vaping Use   Vaping Use: Never used  Substance and Sexual Activity   Alcohol use: Not Currently    Comment: No alcohol since 1995   Drug use: Never   Sexual activity: Not Currently    Birth control/protection: None  Other Topics Concern   Not on file  Social History Narrative   Not on file   Social Determinants of Health   Financial Resource Strain: Not on file  Food Insecurity: Not on file  Transportation Needs: Not on file  Physical Activity: Not on file  Stress: Not on file  Social Connections: Not on file     Family History: The patient's family history includes Alcohol abuse in his father; Anxiety disorder in his brother, daughter, and mother; Cancer in his mother; Depression in his brother and daughter; Early death in his father; Stroke in his mother.  ROS:   Please see the history of present illness.  (+) Unsteadiness (+) Palpitations All other systems reviewed and are negative.  EKGs/Labs/Other Studies Reviewed:    The following studies were reviewed today: TTE 04/12/2021: IMPRESSIONS   1. Left  ventricular ejection fraction, by estimation, is 55 to 60%. Left  ventricular ejection fraction by 3D volume is 58 %. The left ventricle has  normal function. The left ventricle has no regional wall motion  abnormalities. Left ventricular diastolic   parameters were normal.   2. Right ventricular systolic function is normal. The right ventricular  size is normal. There is normal pulmonary artery systolic pressure. The  estimated right ventricular systolic pressure is 02.5 mmHg.   3. The mitral valve is grossly normal. Trivial mitral valve  regurgitation. No evidence of mitral stenosis.   4. The aortic valve is tricuspid. Aortic valve regurgitation is not  visualized. No aortic stenosis is present.   5. The inferior  vena cava is normal in size with greater than 50%  respiratory variability, suggesting right atrial pressure of 3 mmHg.  Myoview 10/2020: IMPRESSIONS & RECOMMENDATIONS There is a small, fixed inferior wall defect of mild severity, without definite evidence of ischemia. Diaphragmatic attenuation artifact present. Atrial fibrillation present throughout study Recommend clinical correlation. Cardiac History: Hypertension, hyperlipidemia, tobacco abuse, A-Fib, DVT, PE.  No previous studies.   LV EF & Wall Motion: Mildly dilated LV size with mild LV systolic dysfunction, with mild global hypokinesis.  ABI 03/09/21: Summary:  Right: Resting right ankle-brachial index is within normal range. No  evidence of significant right lower extremity arterial disease. The right  toe-brachial index is normal.   Left: Resting left ankle-brachial index is within normal range. No  evidence of significant left lower extremity arterial disease. The left  toe-brachial index is normal.   EKG:   07/12/2022 EKG: Afib with RVR with HR 126 04/13/2021: EKG was not ordered today.  Recent Labs: 11/15/2021: ALT 22; BUN 14; Creat 0.93; Hemoglobin 15.3; Platelets 220; Potassium 4.7; Sodium 139; TSH 1.46   Recent Lipid Panel    Component Value Date/Time   CHOL 166 06/01/2021 0915   TRIG 76 06/01/2021 0915   HDL 46 06/01/2021 0915   CHOLHDL 3.6 06/01/2021 0915   LDLCALC 106 (H) 06/01/2021 0915     Risk Assessment/Calculations:    CHA2DS2-VASc Score = 4  This indicates a 4.8% annual risk of stroke. The patient's score is based upon: CHF History: 0 HTN History: 1 Diabetes History: 0 Stroke History: 0 Vascular Disease History: 1 Age Score: 2 Gender Score: 0    Physical Exam:    VS:  BP 110/60 (BP Location: Left Arm, Patient Position: Sitting, Cuff Size: Normal)   Pulse (!) 126   Ht 6' (1.829 m)   Wt 201 lb (91.2 kg)   SpO2 96%   BMI 27.26 kg/m     Wt Readings from Last 3 Encounters:  07/12/22 201 lb (91.2 kg)  02/09/22 195 lb 1.9 oz (88.5 kg)  11/15/21 204 lb 14.4 oz (92.9 kg)     GEN: Well nourished, well developed in no acute distress HEENT: Normal NECK: No JVD; No carotid bruits CARDIAC: Tachycardic, irregular, no murmurs rubs or gallops RESPIRATORY:  Clear to auscultation without rales, wheezing or rhonchi  ABDOMEN: Soft, non-tender, non-distended MUSCULOSKELETAL: No edema; No deformity  SKIN: Warm and dry NEUROLOGIC:  Alert and oriented x 3 PSYCHIATRIC:  Normal affect   ASSESSMENT:    1. Atrial fibrillation, unspecified type ([place])   2. Pre-procedure lab exam   3. Acute venous embolism and thrombosis of deep vessels of proximal lower extremity, unspecified laterality ([place])   4. Hyperlipidemia, unspecified hyperlipidemia type   5. Pulmonary embolism, other, unspecified chronicity, unspecified whether acute cor pulmonale present ([place])   6. Bilateral leg pain   7. Tobacco abuse   8. Primary hypertension     PLAN:    In order of problems listed above:  #Parosyxmal Afib: Previously followed by [facility] cardiology. CHADs-vasc 3. TTE with normal BiV function, no significant valve disease. On warfarin for AC and metoprolol for rate control. Currently in Afib  with RVR with no significant symptoms. Will plan for DCCV if remains in Afib. Will also refer to EP for consideration of an ablation per patient preference.  -Plan for DCCV  on 07/21/22 -Will check ECG prior to ensure still in Afib -Continue warfarin for AC--follows at anticoagulation clinic -Increase metop to 37.'5mg'$  XL daily -Refer  to EP for consideration of ablation.   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 Has remained active and walks without issues. Likely related to ongoing tobacco use. Myoview in 10/2020 with no ischemia; possible infarct vs attenuation defect inferiorly. Normal EF on TTE with no valve abnormalities -Myoview at OSH negative for ischemia -TTE with normal BiV function, no significant valve disease -Smoking cessation counseling provided   #Bilateral LE Pain: Improved. ABI negative -ABIs negative -Continue exercise   #HTN: Well controlled today. -Continue lisinopril '10mg'$  daily -Increase metop to 37.'5mg'$  XL daily   #Tobacco Use: -Tobacco cessation counseling  #Risk stratification: -Check lipids    Medication Adjustments/Labs and Tests Ordered: Current medicines are reviewed at length with the patient today.  Concerns regarding medicines are outlined above.  Orders Placed This Encounter  Procedures   CBC w/Diff   Basic metabolic panel   Protime-INR   Ambulatory referral to Cardiac Electrophysiology   EKG 12-Lead   Meds ordered this encounter  Medications   metoprolol succinate (TOPROL XL) 25 MG 24 hr tablet    Sig: Take 1.5 tablets (37.5 mg total) by mouth daily.    Dispense:  135 tablet    Refill:  2    Dose increase    Patient Instructions  Medication Instructions:   INCREASE YOUR METOPROLOL SUCCINATE TO 37.5 MG BY MOUTH DAILY  *If you need a  refill on your cardiac medications before your next appointment, please call your pharmacy*   You have been referred to SEE AN ELECTROPHYSIOLOGIST FOR CONSIDERATION OF AFIB ABLATION    Lab Work:  NEXT [facility] 7/26 --PRE-PROCEDURE LABS--BMET, PT/INR, AND CBC W DIFF--YOU WILL NEED NURSE VISIT EKG SAME DAY AS THIS APPOINTMENT  If you have labs (blood work) drawn today and your tests are completely normal, you will receive your results only by: [place] (if you have MyChart) OR A paper copy in the mail If you have any lab test that is abnormal or we need to change your treatment, we will call you to review the results.   Testing/Procedures:   You are scheduled for a Cardioversion on NEXT THURSDAY 07/21/22 with Dr. Johnsie Cancel.  Please arrive at the [facility] (Main Entrance A) at [facility]: [address] [place], [place] [address] at 8 am. (1 hour prior to procedure unless lab work is needed; if lab work is needed arrive 1.5 hours ahead)  DIET: Nothing to eat or drink after midnight except a sip of water with medications (see medication instructions below)  FYI: For your safety, and to allow [place] to monitor your vital signs accurately during the surgery/procedure we request that   if you have artificial nails, gel coating, SNS etc. Please have those removed prior to your surgery/procedure. Not having the nail coverings /polish removed may result in cancellation or delay of your surgery/procedure.   Medication Instructions: Continue your anticoagulant: COUMADIN AS DIRECTED--WE WILL CHECK YOUR INR AT LAB APPT ON 7/26 You will need to continue your anticoagulant after your procedure until you are told by your  Provider that it is safe to stop   Labs:   Come to: LAB ON 07/20/22 HERE IN THE OFFICE--WE WILL CHECK PT/INR, BMET, AND CBC W DIFF   You must have a responsible person to drive you home and stay in the  waiting area during your procedure. Failure to do so could result  in cancellation.  Bring your insurance cards.  *Special Note: Every effort is made to have your procedure done on time. Occasionally there are emergencies that occur at the hospital that may cause delays. Please be patient if a delay does occur.     Follow-Up:  1.) PATIENT WILL NEED EKG NURSE VISIT AND LAB APPOINTMENT SAME DAY ON 07/20/22 PER DR. Dutch Gray CONFIRM IF STILL IN AFIB OR NOTE BEFORE CARDIOVERSION ON 07/21/22  2.)  6 MONTHS IN THE OFFICE WITH DR. Johney Frame            Follow-up in 6 months.   I,Tinashe Williams,acting as a Education administrator for Freada Bergeron, MD.,have documented all relevant documentation on the behalf of Freada Bergeron, MD,as directed by  Freada Bergeron, MD while in the presence of Freada Bergeron, MD.   I, Freada Bergeron, MD, have reviewed all documentation for this visit. The documentation on 07/12/22 for the exam, diagnosis, procedures, and orders are all accurate and complete.

## 2022-07-12 NOTE — Progress Notes (Signed)
    Procedures performed today:    Procedure: Real-time Ultrasound Guided injection of the left hip joint Device: Samsung HS60  Verbal informed consent obtained.  Time-out conducted.  Noted no overlying erythema, induration, or other signs of local infection.  Skin prepped in a sterile fashion.  Local anesthesia: Topical Ethyl chloride.  With sterile technique and under real time ultrasound guidance: Noted arthritic joint, 1 cc Kenalog 40, 2 cc lidocaine, 2 cc bupivacaine injected easily Completed without difficulty  Advised to call if fevers/chills, erythema, induration, drainage, or persistent bleeding.  Images permanently stored and available for review in PACS.  Impression: Technically successful ultrasound guided injection.  Independent interpretation of notes and tests performed by another provider:   None.  Brief History, Exam, Impression, and Recommendations:    Primary osteoarthritis of left hip Pleasant 76 year old male, known hip osteoarthritis, last injected December 2022, recurrence of pain, reinjection today, return as needed.    ____________________________________________ Gwen Her. Dianah Field, M.D., ABFM., CAQSM., AME. Primary Care and Sports Medicine [facility]  Adjunct Professor of [employer]  Risk manager

## 2022-07-14 ENCOUNTER — Encounter (HOSPITAL_COMMUNITY): Payer: Self-pay | Admitting: Cardiovascular Disease

## 2022-07-15 ENCOUNTER — Ambulatory Visit: Payer: Medicare Other | Admitting: Physician Assistant

## 2022-07-15 ENCOUNTER — Encounter: Payer: Self-pay | Admitting: Physician Assistant

## 2022-07-15 VITALS — BP 139/79 | HR 72 | Temp 98.3°F | Ht 72.0 in | Wt 198.0 lb

## 2022-07-15 DIAGNOSIS — R109 Unspecified abdominal pain: Secondary | ICD-10-CM | POA: Insufficient documentation

## 2022-07-15 DIAGNOSIS — R300 Dysuria: Secondary | ICD-10-CM | POA: Diagnosis not present

## 2022-07-15 DIAGNOSIS — Z87442 Personal history of urinary calculi: Secondary | ICD-10-CM

## 2022-07-15 DIAGNOSIS — R3915 Urgency of urination: Secondary | ICD-10-CM | POA: Insufficient documentation

## 2022-07-15 DIAGNOSIS — Z7901 Long term (current) use of anticoagulants: Secondary | ICD-10-CM

## 2022-07-15 LAB — POCT URINALYSIS DIP (CLINITEK)
Bilirubin, UA: NEGATIVE
Glucose, UA: NEGATIVE mg/dL
Ketones, POC UA: NEGATIVE mg/dL
Leukocytes, UA: NEGATIVE
Nitrite, UA: NEGATIVE
POC PROTEIN,UA: 30 — AB
Spec Grav, UA: <=1.005 — AB (ref 1.010–1.025)
Urobilinogen, UA: 0.2 E.U./dL
pH, UA: 5 (ref 5.0–8.0)

## 2022-07-15 MED ORDER — TAMSULOSIN HCL 0.4 MG PO CAPS: 0.4000 mg | ORAL_CAPSULE | Freq: Every day | ORAL | 0 refills | Status: DC

## 2022-07-15 MED ORDER — CEPHALEXIN 500 MG PO CAPS: 500.0000 mg | ORAL_CAPSULE | Freq: Two times a day (BID) | ORAL | 0 refills | Status: DC

## 2022-07-15 NOTE — Patient Instructions (Signed)
Start flomax daily and keflex twice a day for 7 days. Follow up if not improving or if pain worsening Stay hydrated and drink plenty of fluids

## 2022-07-15 NOTE — Progress Notes (Signed)
Acute Office Visit  Subjective:     Patient ID: Larry Odonnell, male    DOB: [birth year], 76 y.o.   MRN: [id number]  Chief Complaint  Patient presents with   Dysuria    HPI Patient is in today for several days of dysuria, increase  in urine frequency and left flank pain. Pt does have hx of kidney stones but has not had one in a while. Pt has had his prostate removed. Symptoms are becoming more and more uncomfortable. Denies any fever, chills, body aches, nausea, or vomiting. He does have some left flank pain. He is in continuous a.fib and has cardioversion scheduled for next week and on coumadin for anti-coagulation.   .. Active Ambulatory Problems    Diagnosis Date Noted   Atrial fibrillation ([place]) 03/23/2021   Acute venous embolism and thrombosis of deep vessels of proximal lower extremity ([place]) 03/23/2021   Pulmonary embolus ([place]) 03/23/2021   Primary hypertension 08/09/2021   Mixed hyperlipidemia 08/09/2021   Anxiety with depression 08/09/2021   Primary osteoarthritis of left hip 11/23/2021   Left shoulder pain 11/23/2021   Left flank pain 07/15/2022   Urinary urgency 07/15/2022   Dysuria 07/15/2022   Anticoagulated 07/15/2022   History of kidney stones 07/15/2022   Resolved Ambulatory Problems    Diagnosis Date Noted   No Resolved Ambulatory Problems   Past Medical History:  Diagnosis Date   A-fib [facility])    Allergy    Anxiety 2006   Cancer [facility]) 2012   Cataract 2017   Depression 2006   DVT (deep venous thrombosis) ([place])    Hypertension    Pulmonary embolism ([place])      ROS  See HPI.     Objective:    BP 139/79   Pulse 72   Temp 98.3 F (36.8 C) (Oral)   Ht 6' (1.829 m)   Wt 198 lb (89.8 kg)   SpO2 98%   BMI 26.85 kg/m  BP Readings from Last 3 Encounters:  07/15/22 139/79  07/12/22 110/60  02/09/22 109/74   Wt Readings from Last 3 Encounters:  07/15/22 198 lb (89.8 kg)  07/12/22 201 lb (91.2 kg)  02/09/22 195 lb 1.9 oz (88.5 kg)     .Marland Kitchen Results for orders placed or performed in visit on 07/15/22  POCT URINALYSIS DIP (CLINITEK)  Result Value Ref Range   Color, UA yellow yellow   Clarity, UA clear clear   Glucose, UA negative negative mg/dL   Bilirubin, UA negative negative   Ketones, POC UA negative negative mg/dL   Spec Grav, UA <=1.005 (A) 1.010 - 1.025   Blood, UA large (A) negative   pH, UA 5.0 5.0 - 8.0   POC PROTEIN,UA =30 (A) negative, trace   Urobilinogen, UA 0.2 0.2 or 1.0 E.U./dL   Nitrite, UA Negative Negative   Leukocytes, UA Negative Negative     Physical Exam Constitutional:      Appearance: Normal appearance.  HENT:     Head: Normocephalic.  Cardiovascular:     Rate and Rhythm: Normal rate. Rhythm irregular.     Pulses: Normal pulses.  Pulmonary:     Effort: Pulmonary effort is normal.  Abdominal:     General: Bowel sounds are normal. There is no distension.     Palpations: Abdomen is soft. There is no mass.     Tenderness: There is no abdominal tenderness. There is no right CVA tenderness, left CVA tenderness, guarding or rebound.  Musculoskeletal:  Right lower leg: No edema.     Left lower leg: No edema.  Neurological:     General: No focal deficit present.     Mental Status: He is alert and oriented to person, place, and time.  Psychiatric:        Mood and Affect: Mood normal.           Assessment & Plan:  Marland KitchenMarland KitchenArnel was seen today for dysuria.  Diagnoses and all orders for this visit:  Left flank pain -     Urine Culture -     tamsulosin (FLOMAX) 0.4 MG CAPS capsule; Take 1 capsule (0.4 mg total) by mouth daily after supper. -     cephALEXin (KEFLEX) 500 MG capsule; Take 1 capsule (500 mg total) by mouth 2 (two) times daily.  Dysuria -     POCT URINALYSIS DIP (CLINITEK) -     Urine Culture -     tamsulosin (FLOMAX) 0.4 MG CAPS capsule; Take 1 capsule (0.4 mg total) by mouth daily after supper. -     cephALEXin (KEFLEX) 500 MG capsule; Take 1 capsule (500 mg total) by  mouth 2 (two) times daily.  Urinary urgency -     POCT URINALYSIS DIP (CLINITEK) -     Urine Culture -     tamsulosin (FLOMAX) 0.4 MG CAPS capsule; Take 1 capsule (0.4 mg total) by mouth daily after supper. -     cephALEXin (KEFLEX) 500 MG capsule; Take 1 capsule (500 mg total) by mouth 2 (two) times daily.  History of kidney stones -     tamsulosin (FLOMAX) 0.4 MG CAPS capsule; Take 1 capsule (0.4 mg total) by mouth daily after supper.  Anticoagulated  UA positive for blood and protein Will culture to look for bacteria Discussed UTI vs kidney stone Start flomax for 15 days Start keflex bid for 7 days-choose due to lowest risk of interaction with coumadin to treat empirically for infection due to symptoms Continue to hydrate  Follow up as needed or if symptoms worsen or change Not in rhythm today continues to need cardioversion Continue on anti-coagulation  Iran Planas, PA-C

## 2022-07-17 LAB — URINE CULTURE
MICRO NUMBER:: 13681129
Result:: NO GROWTH
SPECIMEN QUALITY:: ADEQUATE

## 2022-07-18 NOTE — Progress Notes (Signed)
HI Shanon Brow, urine culture didn't grow out any bacterial. no sign of infection.

## 2022-07-20 ENCOUNTER — Telehealth: Payer: Self-pay

## 2022-07-20 ENCOUNTER — Other Ambulatory Visit: Payer: Medicare Other

## 2022-07-20 ENCOUNTER — Ambulatory Visit (INDEPENDENT_AMBULATORY_CARE_PROVIDER_SITE_OTHER): Payer: Medicare Other | Admitting: Internal Medicine

## 2022-07-20 VITALS — BP 118/78 | HR 147 | Ht 73.0 in | Wt 196.8 lb

## 2022-07-20 DIAGNOSIS — Z01812 Encounter for preprocedural laboratory examination: Secondary | ICD-10-CM

## 2022-07-20 DIAGNOSIS — I4891 Unspecified atrial fibrillation: Secondary | ICD-10-CM

## 2022-07-20 LAB — CBC WITH DIFFERENTIAL/PLATELET
Basophils Absolute: 0 10*3/uL (ref 0.0–0.2)
Basos: 0 %
EOS (ABSOLUTE): 0.1 10*3/uL (ref 0.0–0.4)
Eos: 1 %
Hematocrit: 47.3 % (ref 37.5–51.0)
Hemoglobin: 16.3 g/dL (ref 13.0–17.7)
Lymphocytes Absolute: 3.4 10*3/uL — ABNORMAL HIGH (ref 0.7–3.1)
Lymphs: 26 %
MCH: 31.8 pg (ref 26.6–33.0)
MCHC: 34.5 g/dL (ref 31.5–35.7)
MCV: 92 fL (ref 79–97)
Monocytes Absolute: 1.3 10*3/uL — ABNORMAL HIGH (ref 0.1–0.9)
Monocytes: 10 %
Neutrophils Absolute: 8.1 10*3/uL — ABNORMAL HIGH (ref 1.4–7.0)
Neutrophils: 63 %
Platelets: 188 10*3/uL (ref 150–450)
RBC: 5.13 x10E6/uL (ref 4.14–5.80)
RDW: 14.6 % (ref 11.6–15.4)
WBC: 12.9 10*3/uL — ABNORMAL HIGH (ref 3.4–10.8)

## 2022-07-20 LAB — PROTIME-INR
INR: 3.1 — ABNORMAL HIGH (ref 0.9–1.2)
Prothrombin Time: 29.5 s — ABNORMAL HIGH (ref 9.1–12.0)

## 2022-07-20 LAB — BASIC METABOLIC PANEL
BUN/Creatinine Ratio: 16 (ref 10–24)
BUN: 17 mg/dL (ref 8–27)
CO2: 23 mmol/L (ref 20–29)
Calcium: 9.7 mg/dL (ref 8.6–10.2)
Chloride: 103 mmol/L (ref 96–106)
Creatinine, Ser: 1.07 mg/dL (ref 0.76–1.27)
Glucose: 86 mg/dL (ref 70–99)
Potassium: 4.1 mmol/L (ref 3.5–5.2)
Sodium: 136 mmol/L (ref 134–144)
eGFR: 72 mL/min/{1.73_m2} (ref >59)

## 2022-07-20 NOTE — Progress Notes (Signed)
   Nurse Visit   Date of Encounter: 07/20/2022 ID: Larry Odonnell, DOB 09-10-1946, MRN [id number]  PCP:  Samuel Bouche, NP   [facility] Providers Cardiologist:  None      Visit Details   VS:  BP 118/78 (BP Location: Left Arm, Patient Position: Sitting, Cuff Size: Normal)   Pulse (!) 147   Ht '6\' 1"'$  (1.854 m)   Wt 196 lb 12.8 oz (89.3 kg)   SpO2 97%   BMI 25.96 kg/m  , BMI Body mass index is 25.96 kg/m.  Wt Readings from Last 3 Encounters:  07/20/22 196 lb 12.8 oz (89.3 kg)  07/15/22 198 lb (89.8 kg)  07/12/22 201 lb (91.2 kg)     Reason for visit: EKG to confirm A-fib prior to DCCV Performed today: Vitals, EKG, Provider consulted:Klein, and Education Changes (medications, testing, etc.) : NONE Length of Visit: 20 minutes    Medications Adjustments/Labs and Tests Ordered: No orders of the defined types were placed in this encounter.  No orders of the defined types were placed in this encounter.  Pt remains in A-fib with RVR. No symptoms to report. DCCV scheduled for tomorrow 07/21/22. Pt having labs done in office today.  Signed, Ma Hillock, RN  07/20/2022 2:42 PM

## 2022-07-20 NOTE — Anesthesia Preprocedure Evaluation (Signed)
Anesthesia Evaluation  Patient identified by MRN, date of birth, ID band Patient awake    Reviewed: Allergy & Precautions, NPO status , Patient's Chart, lab work & pertinent test results  History of Anesthesia Complications Negative for: history of anesthetic complications  Airway Mallampati: II  TM Distance: >3 FB Neck ROM: Full    Dental  (+) Teeth Intact   Pulmonary Current Smoker, PE   Pulmonary exam normal        Cardiovascular hypertension, + DVT  Normal cardiovascular exam+ dysrhythmias Atrial Fibrillation   Echo 03/25/21: EF 55-60%, no RWMA, normal DF, normal RVSF, trivial MR, no sig valve disease   Neuro/Psych negative neurological ROS     GI/Hepatic negative GI ROS, Neg liver ROS,   Endo/Other  negative endocrine ROS  Renal/GU negative Renal ROS  negative genitourinary   Musculoskeletal  (+) Arthritis ,   Abdominal   Peds  Hematology negative hematology ROS (+)   Anesthesia Other Findings   Reproductive/Obstetrics                            Anesthesia Physical Anesthesia Plan  ASA: 3  Anesthesia Plan: General   Post-op Pain Management: Minimal or no pain anticipated   Induction: Intravenous  PONV Risk Score and Plan: TIVA and Treatment may vary due to age or medical condition  Airway Management Planned: Mask  Additional Equipment: None  Intra-op Plan:   Post-operative Plan:   Informed Consent: I have reviewed the patients History and Physical, chart, labs and discussed the procedure including the risks, benefits and alternatives for the proposed anesthesia with the patient or authorized representative who has indicated his/her understanding and acceptance.       Plan Discussed with:   Anesthesia Plan Comments:        Anesthesia Quick Evaluation

## 2022-07-20 NOTE — Telephone Encounter (Signed)
Pt scheduled for DCCV tomorrow 06/2722. Called pt and scheduled anticoagulation appt on 07/27/22, 1 week post procedure. Pt verbalized understanding.

## 2022-07-20 NOTE — Patient Instructions (Signed)
Medication Instructions:  Your physician recommends that you continue on your current medications as directed. Please refer to the Current Medication list given to you today.  *If you need a refill on your cardiac medications before your next appointment, please call your pharmacy*   Lab Work: TODAY If you have labs (blood work) drawn today and your tests are completely normal, you will receive your results only by: [place] (if you have MyChart) OR A paper copy in the mail If you have any lab test that is abnormal or we need to change your treatment, we will call you to review the results.   Testing/Procedures: Cardioversion (tomorrow) Your physician has recommended that you have a Cardioversion (DCCV). Electrical Cardioversion uses a jolt of electricity to your heart either through paddles or wired patches attached to your chest. This is a controlled, usually prescheduled, procedure. Defibrillation is done under light anesthesia in the hospital, and you usually go home the day of the procedure. This is done to get your heart back into a normal rhythm. You are not awake for the procedure. Please see the instruction sheet given to you today.    Follow-Up: At [facility], you and your health needs are our priority.  As part of our continuing mission to provide you with exceptional heart care, we have created designated Provider Care Teams.  These Care Teams include your primary Cardiologist (physician) and Advanced Practice Providers (APPs -  Physician Assistants and Nurse Practitioners) who all work together to provide you with the care you need, when you need it.  We recommend signing up for the patient portal called "MyChart".  Sign up information is provided on this After Visit Summary.  MyChart is used to connect with patients for Virtual Visits (Telemedicine).  Patients are able to view lab/test results, encounter notes, upcoming appointments, etc.  Non-urgent messages can be  sent to your provider as well.   To learn more about what you can do with MyChart, go to [email or web address].    Your next appointment:   08/19/22 @ 11:00  The format for your next appointment:   In Person  Provider:   Lars Mage, MD {  Important Information About Sugar

## 2022-07-21 ENCOUNTER — Ambulatory Visit (HOSPITAL_BASED_OUTPATIENT_CLINIC_OR_DEPARTMENT_OTHER): Payer: Medicare Other | Admitting: Certified Registered Nurse Anesthetist

## 2022-07-21 ENCOUNTER — Encounter (HOSPITAL_COMMUNITY): Payer: Self-pay | Admitting: Cardiovascular Disease

## 2022-07-21 ENCOUNTER — Ambulatory Visit (HOSPITAL_COMMUNITY)
Admission: RE | Admit: 2022-07-21 | Discharge: 2022-07-21 | Disposition: A | Discharge status: Home / Self Care | Payer: Medicare Other | Attending: Cardiovascular Disease | Admitting: Cardiovascular Disease

## 2022-07-21 ENCOUNTER — Other Ambulatory Visit: Payer: Self-pay

## 2022-07-21 ENCOUNTER — Encounter (HOSPITAL_COMMUNITY)
Admission: RE | Disposition: A | Discharge status: Home / Self Care | Payer: Self-pay | Source: Home / Self Care | Attending: Cardiovascular Disease

## 2022-07-21 ENCOUNTER — Ambulatory Visit (HOSPITAL_COMMUNITY): Payer: Medicare Other | Admitting: Certified Registered Nurse Anesthetist

## 2022-07-21 DIAGNOSIS — M79605 Pain in left leg: Secondary | ICD-10-CM | POA: Diagnosis not present

## 2022-07-21 DIAGNOSIS — Z86718 Personal history of other venous thrombosis and embolism: Secondary | ICD-10-CM | POA: Insufficient documentation

## 2022-07-21 DIAGNOSIS — Z7901 Long term (current) use of anticoagulants: Secondary | ICD-10-CM | POA: Diagnosis not present

## 2022-07-21 DIAGNOSIS — Z79899 Other long term (current) drug therapy: Secondary | ICD-10-CM | POA: Diagnosis not present

## 2022-07-21 DIAGNOSIS — E785 Hyperlipidemia, unspecified: Secondary | ICD-10-CM | POA: Diagnosis not present

## 2022-07-21 DIAGNOSIS — M1990 Unspecified osteoarthritis, unspecified site: Secondary | ICD-10-CM

## 2022-07-21 DIAGNOSIS — F17210 Nicotine dependence, cigarettes, uncomplicated: Secondary | ICD-10-CM | POA: Insufficient documentation

## 2022-07-21 DIAGNOSIS — I4891 Unspecified atrial fibrillation: Secondary | ICD-10-CM

## 2022-07-21 DIAGNOSIS — I10 Essential (primary) hypertension: Secondary | ICD-10-CM | POA: Diagnosis not present

## 2022-07-21 DIAGNOSIS — Z86711 Personal history of pulmonary embolism: Secondary | ICD-10-CM | POA: Insufficient documentation

## 2022-07-21 DIAGNOSIS — M79604 Pain in right leg: Secondary | ICD-10-CM | POA: Diagnosis not present

## 2022-07-21 DIAGNOSIS — I4819 Other persistent atrial fibrillation: Secondary | ICD-10-CM

## 2022-07-21 HISTORY — PX: CARDIOVERSION: SHX1299

## 2022-07-21 SURGERY — CARDIOVERSION
Anesthesia: General

## 2022-07-21 MED ORDER — LIDOCAINE 2% (20 MG/ML) 5 ML SYRINGE
INTRAMUSCULAR | Status: DC | PRN
  Administered 2022-07-21: 60 mg via INTRAVENOUS

## 2022-07-21 MED ORDER — LACTATED RINGERS IV SOLN: INTRAVENOUS | Status: DC

## 2022-07-21 MED ORDER — PROPOFOL 10 MG/ML IV BOLUS
INTRAVENOUS | Status: DC | PRN
  Administered 2022-07-21: 50 mg via INTRAVENOUS

## 2022-07-21 MED ORDER — SODIUM CHLORIDE 0.9 % IV SOLN: INTRAVENOUS | Status: DC

## 2022-07-21 NOTE — Transfer of Care (Signed)
Immediate Anesthesia Transfer of Care Note  Patient: Larry Odonnell [facility]  Procedure(s) Performed: CARDIOVERSION  Patient Location: Endoscopy Unit  Anesthesia Type:General  Level of Consciousness: awake and drowsy  Airway & Oxygen Therapy: Patient Spontanous Breathing  Post-op Assessment: Report given to RN, Post -op Vital signs reviewed and stable and Patient moving all extremities X 4  Post vital signs: Reviewed and stable  Last Vitals:  Vitals Value Taken Time  BP 121/78   Temp    Pulse 83   Resp 16   SpO2 96     Last Pain:  Vitals:   07/21/22 1331  TempSrc: Temporal  PainSc: 0-No pain         Complications: No notable events documented.

## 2022-07-21 NOTE — Discharge Instructions (Signed)

## 2022-07-21 NOTE — Interval H&P Note (Signed)
History and Physical Interval Note:  07/21/2022 1:27 PM  Larry Odonnell  has presented today for surgery, with the diagnosis of AFIB WITH RVR.  The various methods of treatment have been discussed with the patient and family. After consideration of risks, benefits and other options for treatment, the patient has consented to  Procedure(s): CARDIOVERSION (N/A) as a surgical intervention.  The patient's history has been reviewed, patient examined, no change in status, stable for surgery.  I have reviewed the patient's chart and labs.  Questions were answered to the patient's satisfaction.     Jenkins Rouge

## 2022-07-21 NOTE — CV Procedure (Signed)
[place]: Anesthesia: Propofol On Rx coumadin   DCC x 2 150J and 200 J with manual compression biphasic  Converted from afib rate 115 to NSR rate 72 bpm  No immediate neurologic sequelae  Jenkins Rouge MD [facility]

## 2022-07-21 NOTE — Anesthesia Postprocedure Evaluation (Signed)
Anesthesia Post Note  Patient: Larry Odonnell [facility]  Procedure(s) Performed: CARDIOVERSION     Patient location during evaluation: Endoscopy Anesthesia Type: General Level of consciousness: awake and alert Pain management: pain level controlled Vital Signs Assessment: post-procedure vital signs reviewed and stable Respiratory status: spontaneous breathing, nonlabored ventilation and respiratory function stable Cardiovascular status: blood pressure returned to baseline and stable Postop Assessment: no apparent nausea or vomiting Anesthetic complications: no   No notable events documented.  Last Vitals:  Vitals:   07/21/22 1423 07/21/22 1431  BP: 118/87 122/81  Pulse: 68 61  Resp: (!) 24 19  Temp:    SpO2: 98% 98%    Last Pain:  Vitals:   07/21/22 1431  TempSrc:   PainSc: 0-No pain                 Lidia Collum

## 2022-07-21 NOTE — Anesthesia Procedure Notes (Addendum)
Procedure Name: General with mask airway Date/Time: 07/21/2022 2:05 PM  Performed by: Merryl Hacker, RNPre-anesthesia Checklist: Patient identified, Emergency Drugs available, Suction available and Patient being monitored Patient Re-evaluated:Patient Re-evaluated prior to induction Oxygen Delivery Method: Ambu bag Preoxygenation: Pre-oxygenation with 100% oxygen Induction Type: IV induction Placement Confirmation: positive ETCO2 and breath sounds checked- equal and bilateral Dental Injury: Teeth and Oropharynx as per pre-operative assessment

## 2022-07-22 ENCOUNTER — Other Ambulatory Visit: Payer: Self-pay | Admitting: Physician Assistant

## 2022-07-22 ENCOUNTER — Encounter (HOSPITAL_COMMUNITY): Payer: Self-pay | Admitting: Cardiovascular Disease

## 2022-07-22 DIAGNOSIS — R3915 Urgency of urination: Secondary | ICD-10-CM

## 2022-07-22 DIAGNOSIS — Z87442 Personal history of urinary calculi: Secondary | ICD-10-CM

## 2022-07-22 DIAGNOSIS — R300 Dysuria: Secondary | ICD-10-CM

## 2022-07-22 DIAGNOSIS — R109 Unspecified abdominal pain: Secondary | ICD-10-CM

## 2022-07-27 ENCOUNTER — Ambulatory Visit (INDEPENDENT_AMBULATORY_CARE_PROVIDER_SITE_OTHER): Payer: Medicare Other

## 2022-07-27 DIAGNOSIS — I824Y9 Acute embolism and thrombosis of unspecified deep veins of unspecified proximal lower extremity: Secondary | ICD-10-CM | POA: Diagnosis not present

## 2022-07-27 DIAGNOSIS — I4891 Unspecified atrial fibrillation: Secondary | ICD-10-CM | POA: Diagnosis not present

## 2022-07-27 DIAGNOSIS — Z5181 Encounter for therapeutic drug level monitoring: Secondary | ICD-10-CM

## 2022-07-27 LAB — POCT INR: INR: 3.5 — AB (ref 2.0–3.0)

## 2022-07-27 NOTE — Patient Instructions (Signed)
Description   Hold today's dose and then continue taking taking '7mg'$  daily. Stay consistent with greens each week. Recheck INR in 2 weeks. Anticoagulation Clinic [phone] or [phone]

## 2022-08-01 ENCOUNTER — Other Ambulatory Visit: Payer: Self-pay | Admitting: *Deleted

## 2022-08-01 DIAGNOSIS — I4891 Unspecified atrial fibrillation: Secondary | ICD-10-CM

## 2022-08-01 MED ORDER — LISINOPRIL 10 MG PO TABS: 10.0000 mg | ORAL_TABLET | Freq: Every day | ORAL | 3 refills | Status: DC

## 2022-08-01 MED ORDER — WARFARIN SODIUM 5 MG PO TABS: ORAL_TABLET | ORAL | 1 refills | Status: DC

## 2022-08-01 MED ORDER — WARFARIN SODIUM 1 MG PO TABS: ORAL_TABLET | ORAL | 1 refills | Status: DC

## 2022-08-08 NOTE — Progress Notes (Unsigned)
   Established Patient Office Visit  Subjective   Patient ID: DIVONTE SENGER, male   DOB: 1946/10/29 Age: 76 y.o. MRN: [id number]   No chief complaint on file.   HPI Pleasant 76 year old male presenting today for the following:  Mood:  Hypertension:   Objective:    There were no vitals filed for this visit.  Physical Exam   No results found for this or any previous visit (from the past 24 hour(s)).   {Labs (Optional):23779}  The 10-year ASCVD risk score (Arnett DK, et al., 2019) is: 27.6%   Values used to calculate the score:     Age: 41 years     Sex: Male     Is Non-Hispanic African American: No     Diabetic: No     Tobacco smoker: Yes     Systolic Blood Pressure: 678 mmHg     Is BP treated: Yes     HDL Cholesterol: 46 mg/dL     Total Cholesterol: 166 mg/dL   Assessment & Plan:   No problem-specific Assessment & Plan notes found for this encounter.   No follow-ups on file.  ___________________________________________ Clearnce Sorrel, DNP, APRN, FNP-BC [facility] and [place]

## 2022-08-09 ENCOUNTER — Ambulatory Visit (INDEPENDENT_AMBULATORY_CARE_PROVIDER_SITE_OTHER): Payer: Medicare Other | Admitting: Medical-Surgical

## 2022-08-09 ENCOUNTER — Encounter: Payer: Self-pay | Admitting: Medical-Surgical

## 2022-08-09 VITALS — BP 132/84 | HR 80 | Resp 20 | Ht 73.0 in | Wt 196.1 lb

## 2022-08-09 DIAGNOSIS — Z9189 Other specified personal risk factors, not elsewhere classified: Secondary | ICD-10-CM | POA: Diagnosis not present

## 2022-08-09 DIAGNOSIS — I10 Essential (primary) hypertension: Secondary | ICD-10-CM | POA: Diagnosis not present

## 2022-08-09 DIAGNOSIS — F418 Other specified anxiety disorders: Secondary | ICD-10-CM | POA: Diagnosis not present

## 2022-08-09 DIAGNOSIS — L989 Disorder of the skin and subcutaneous tissue, unspecified: Secondary | ICD-10-CM

## 2022-08-09 MED ORDER — CLONAZEPAM 0.5 MG PO TABS: 0.5000 mg | ORAL_TABLET | Freq: Every day | ORAL | 2 refills | Status: DC | PRN

## 2022-08-10 ENCOUNTER — Ambulatory Visit (INDEPENDENT_AMBULATORY_CARE_PROVIDER_SITE_OTHER): Payer: Medicare Other

## 2022-08-10 DIAGNOSIS — Z5181 Encounter for therapeutic drug level monitoring: Secondary | ICD-10-CM

## 2022-08-10 DIAGNOSIS — I4891 Unspecified atrial fibrillation: Secondary | ICD-10-CM | POA: Diagnosis not present

## 2022-08-10 DIAGNOSIS — I2693 Single subsegmental pulmonary embolism without acute cor pulmonale: Secondary | ICD-10-CM

## 2022-08-10 DIAGNOSIS — I824Y9 Acute embolism and thrombosis of unspecified deep veins of unspecified proximal lower extremity: Secondary | ICD-10-CM | POA: Diagnosis not present

## 2022-08-10 LAB — POCT INR: INR: 3.9 — AB (ref 2.0–3.0)

## 2022-08-10 NOTE — Patient Instructions (Signed)
Description   Hold today's dosage of Warfarin, then start taking '7mg'$  daily except '5mg'$  on Mondays and Fridays. Stay consistent with greens each week. Recheck INR in 2 weeks. Anticoagulation Clinic [phone] or [phone]

## 2022-08-17 ENCOUNTER — Other Ambulatory Visit: Payer: Self-pay

## 2022-08-17 ENCOUNTER — Encounter: Payer: Self-pay | Admitting: General Practice

## 2022-08-17 MED ORDER — ROSUVASTATIN CALCIUM 10 MG PO TABS: 10.0000 mg | ORAL_TABLET | Freq: Every day | ORAL | 3 refills | Status: DC

## 2022-08-18 NOTE — Progress Notes (Signed)
Electrophysiology Office Note:    Date:  08/19/2022   ID:  Larry Odonnell, DOB Jan 26, 1946, MRN [id number]  PCP:  Samuel Bouche, NP  [facility] Cardiologist:  Freada Bergeron, MD  [facility] Electrophysiologist:  Vickie Epley, MD   Referring MD: Freada Bergeron, MD   Chief Complaint: Atrial fibrillation  History of Present Illness:    Larry Odonnell is a 76 y.o. male who presents for an evaluation of atrial fibrillation at the request of Dr. Johney Frame. Their medical history includes cancer, DVT, hypertension, hyperlipidemia, tobacco abuse.  The patient saw Dr. Johney Frame July 12, 2022.  He had a cardioversion on July 21, 2022.  The patient was diagnosed with atrial fibrillation in September 2021.  He was started on metoprolol and amiodarone.  The patient expressed an interest in avoiding medications if at all possible.  He has been relatively asymptomatic while in atrial fibrillation.  He wants to avoid medications if at all possible.     Past Medical History:  Diagnosis Date   A-fib [facility])    Allergy    Latex   Anxiety 2006   Cancer [facility]) 2012   Prostate   Cataract 2017   Surgery both eyes   Depression 2006   DVT (deep venous thrombosis) (HCC)    Hypertension    Kidney stones 01/08/2020   Mixed hyperlipidemia 08/09/2021   Peripheral neuropathy 08/02/2012   Primary osteoarthritis of left hip 11/23/2021   Pulmonary embolism ([place])    Tobacco dependence 07/02/2019    Past Surgical History:  Procedure Laterality Date   CARDIOVERSION N/A 07/21/2022   Procedure: CARDIOVERSION;  Surgeon: Josue Hector, MD;  Location: [place];  Service: Cardiovascular;  Laterality: N/A;   EYE SURGERY  2017   Cataracts   PROSTATECTOMY     TONSILLECTOMY      Current Medications: Current Meds  Medication Sig   cholecalciferol (VITAMIN D) 25 MCG (1000 UNIT) tablet Take 1,000 Units by mouth daily.   clonazePAM (KLONOPIN) 0.5 MG tablet Take 1 tablet (0.5 mg total) by  mouth daily as needed for anxiety.   lisinopril (ZESTRIL) 10 MG tablet Take 1 tablet (10 mg total) by mouth daily.   Melatonin 10 MG TABS Take 10 mg by mouth at bedtime.   metoprolol succinate (TOPROL XL) 25 MG 24 hr tablet Take 1.5 tablets (37.5 mg total) by mouth daily.   Multiple Vitamin (MULTI-VITAMIN) tablet Take 1 tablet by mouth daily.   Omega-3 1000 MG CAPS Take 1,000 mg by mouth daily.   rosuvastatin (CRESTOR) 10 MG tablet Take 1 tablet (10 mg total) by mouth daily.   warfarin (COUMADIN) 1 MG tablet Take 2 tablets by mouth once daily along with '5mg'$  tablet or as directed by Coumadin clinic   warfarin (COUMADIN) 5 MG tablet Take 5 mg tablet by mouth once daily along with '1mg'$  tablet or as directed by the coumadin clinic     Allergies:   Atorvastatin, Oxycodone, and Latex   Social History   Socioeconomic History   Marital status: Married    Spouse name: Not on file   Number of children: Not on file   Years of education: Not on file   Highest education level: Not on file  Occupational History   Occupation: Retired  Tobacco Use   Smoking status: Light Smoker    Packs/day: 0.50    Years: 10.00    Total pack years: 5.00    Types: Cigarettes   Smokeless tobacco: Never  Tobacco comments:    Quit [phone]  Vaping Use   Vaping Use: Never used  Substance and Sexual Activity   Alcohol use: Not Currently    Comment: No alcohol since 1995   Drug use: Never   Sexual activity: Not Currently    Birth control/protection: None  Other Topics Concern   Not on file  Social History Narrative   Not on file   Social Determinants of Health   Financial Resource Strain: Not on file  Food Insecurity: Not on file  Transportation Needs: Not on file  Physical Activity: Not on file  Stress: Not on file  Social Connections: Not on file     Family History: The patient's family history includes Alcohol abuse in his father; Anxiety disorder in his brother, daughter, and mother; Cancer in  his mother; Depression in his brother and daughter; Early death in his father; Stroke in his mother.  ROS:   Please see the history of present illness.    All other systems reviewed and are negative.  EKGs/Labs/Other Studies Reviewed:    The following studies were reviewed today:  March 25, 2021 echo EF 55% RV normal Trivial MR  July 21, 2022 EKG shows sinus rhythm with PAC   EKG:  The ekg ordered today demonstrates sinus rhythm   Recent Labs: 11/15/2021: ALT 22; TSH 1.46 07/20/2022: BUN 17; Creatinine, Ser 1.07; Hemoglobin 16.3; Platelets 188; Potassium 4.1; Sodium 136  Recent Lipid Panel    Component Value Date/Time   CHOL 166 06/01/2021 0915   TRIG 76 06/01/2021 0915   HDL 46 06/01/2021 0915   CHOLHDL 3.6 06/01/2021 0915   LDLCALC 106 (H) 06/01/2021 0915    Physical Exam:    VS:  BP 120/60   Pulse 74   Ht '6\' 1"'$  (1.854 m)   Wt 197 lb 9.6 oz (89.6 kg)   SpO2 98%   BMI 26.07 kg/m     Wt Readings from Last 3 Encounters:  08/19/22 197 lb 9.6 oz (89.6 kg)  08/09/22 196 lb 1.9 oz (89 kg)  07/21/22 196 lb 13.9 oz (89.3 kg)     GEN:  Well nourished, well developed in no acute distress HEENT: Normal NECK: No JVD; No carotid bruits LYMPHATICS: No lymphadenopathy CARDIAC: RRR, no murmurs, rubs, gallops RESPIRATORY:  Clear to auscultation without rales, wheezing or rhonchi  ABDOMEN: Soft, non-tender, non-distended MUSCULOSKELETAL:  No edema; No deformity  SKIN: Warm and dry NEUROLOGIC:  Alert and oriented x 3 PSYCHIATRIC:  Normal affect       ASSESSMENT:    1. Persistent atrial fibrillation ([place])   2. Primary hypertension   3. 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  On Coumadin for stroke prophylaxis.  Discussed treatment options today for his AF including antiarrhythmic drug therapy and ablation and a  conservative, watchful waiting approach. Discussed ablation risks, recovery and likelihood of success. Discussed potential need for repeat ablation procedures and antiarrhythmic drugs after an initial ablation. Risk, benefits, and alternatives to EP study and radiofrequency ablation for afib were also discussed in detail today. These risks include but are not limited to stroke, bleeding, vascular damage, tamponade, perforation, damage to the esophagus, lungs, and other structures, pulmonary vein stenosis, worsening renal function, and death. The patient understands these risks.  Given the low burden of atrial fibrillation and his overall state  of health, the patient has elected to proceed with a watchful waiting approach.  We will plan to touch base in about 6 months to review his overall burden of atrial fibrillation.  If he were to have an increase in A-fib burden or symptoms, plan to revisit catheter ablation.  If he elects to ever proceed with ablation he is a good candidate.  We would need a CT scan prior to the procedure.  In the meantime, he will continue Coumadin for stroke prophylaxis.      Medication Adjustments/Labs and Tests Ordered: Current medicines are reviewed at length with the patient today.  Concerns regarding medicines are outlined above.  No orders of the defined types were placed in this encounter.  No orders of the defined types were placed in this encounter.    Signed, Hilton Cork. Quentin Ore, MD, [facility], [facility] 08/19/2022 11:27 AM    Electrophysiology [facility]

## 2022-08-19 ENCOUNTER — Ambulatory Visit (INDEPENDENT_AMBULATORY_CARE_PROVIDER_SITE_OTHER): Payer: Medicare Other | Admitting: Cardiology

## 2022-08-19 ENCOUNTER — Encounter: Payer: Self-pay | Admitting: Cardiology

## 2022-08-19 VITALS — BP 120/60 | HR 74 | Ht 73.0 in | Wt 197.6 lb

## 2022-08-19 DIAGNOSIS — I825Z9 Chronic embolism and thrombosis of unspecified deep veins of unspecified distal lower extremity: Secondary | ICD-10-CM

## 2022-08-19 DIAGNOSIS — I4819 Other persistent atrial fibrillation: Secondary | ICD-10-CM

## 2022-08-19 DIAGNOSIS — I10 Essential (primary) hypertension: Secondary | ICD-10-CM | POA: Diagnosis not present

## 2022-08-19 NOTE — Patient Instructions (Signed)
Medication Instructions:  none *If you need a refill on your cardiac medications before your next appointment, please call your pharmacy*   Lab Work: none If you have labs (blood work) drawn today and your tests are completely normal, you will receive your results only by: [place] (if you have MyChart) OR A paper copy in the mail If you have any lab test that is abnormal or we need to change your treatment, we will call you to review the results.   Testing/Procedures: none   Follow-Up: At [facility], you and your health needs are our priority.  As part of our continuing mission to provide you with exceptional heart care, we have created designated Provider Care Teams.  These Care Teams include your primary Cardiologist (physician) and Advanced Practice Providers (APPs -  Physician Assistants and Nurse Practitioners) who all work together to provide you with the care you need, when you need it.  We recommend signing up for the patient portal called "MyChart".  Sign up information is provided on this After Visit Summary.  MyChart is used to connect with patients for Virtual Visits (Telemedicine).  Patients are able to view lab/test results, encounter notes, upcoming appointments, etc.  Non-urgent messages can be sent to your provider as well.   To learn more about what you can do with MyChart, go to [email or web address].    Your next appointment:   6 month(s)  The format for your next appointment:   In Person  Provider:   Lars Mage, MD{   Other Instructions none  Important Information About [place]

## 2022-08-22 ENCOUNTER — Ambulatory Visit (INDEPENDENT_AMBULATORY_CARE_PROVIDER_SITE_OTHER): Payer: Medicare Other | Admitting: Family Medicine

## 2022-08-22 DIAGNOSIS — F17200 Nicotine dependence, unspecified, uncomplicated: Secondary | ICD-10-CM

## 2022-08-22 DIAGNOSIS — Z122 Encounter for screening for malignant neoplasm of respiratory organs: Secondary | ICD-10-CM | POA: Diagnosis not present

## 2022-08-22 DIAGNOSIS — Z0000 Encounter for general adult medical examination without abnormal findings: Secondary | ICD-10-CM | POA: Diagnosis not present

## 2022-08-22 DIAGNOSIS — Z1211 Encounter for screening for malignant neoplasm of colon: Secondary | ICD-10-CM

## 2022-08-22 NOTE — Progress Notes (Addendum)
MEDICARE ANNUAL WELLNESS VISIT  08/22/2022  Telephone Visit Disclaimer This Medicare AWV was conducted by telephone due to national recommendations for restrictions regarding the COVID-19 Pandemic (e.g. social distancing).  I verified, using two identifiers, that I am speaking with Larry Odonnell or their authorized healthcare agent. I discussed the limitations, risks, security, and privacy concerns of performing an evaluation and management service by telephone and the potential availability of an in-person appointment in the future. The patient expressed understanding and agreed to proceed.  Location of Patient: Home Location of Provider (nurse):  In the office.  Subjective:    Larry Odonnell is a 76 y.o. male patient of Samuel Bouche, NP who had a Medicare Annual Wellness Visit today via telephone. Larry Odonnell is Retired and lives with his wife. he has 1 child. he reports that he is socially active and does interact with friends/family regularly. he is moderately physically active and enjoys yard work.  Patient Care Team: Samuel Bouche, NP as PCP - General (Nurse Practitioner) Freada Bergeron, MD as PCP - Cardiology (Cardiology) Vickie Epley, MD as PCP - Electrophysiology (Cardiology)     08/22/2022    9:07 AM 07/21/2022    1:28 PM 08/09/2021    9:49 AM 11/26/2020    7:15 PM  Advanced Directives  Does Patient Have a Medical Advance Directive? Yes Yes Yes No  Type of Advance Directive Living will [place];Living will [place];Out of facility DNR (pink MOST or yellow form)   Does patient want to make changes to medical advance directive? No - Patient declined  No - Patient declined   Copy of [place] in Chart?  No - copy requested      Hospital Utilization Over the Past 12 Months: # of hospitalizations or ER visits: 0 # of surgeries: 0  Review of Systems    Patient reports that his overall health is unchanged  compared to last year.  History obtained from chart review and the patient  Patient Reported Readings (BP, Pulse, CBG, Weight, etc) none  Pain Assessment Pain : No/denies pain     Current Medications & Allergies (verified) Allergies as of 08/22/2022       Reactions   Atorvastatin Other (See Comments)   Pt reports causes leg pains and leg heaviness   Oxycodone Other (See Comments)   Causes extreme dizziness and falling down   Latex Rash        Medication List        Accurate as of August 22, 2022  9:26 AM. If you have any questions, ask your nurse or doctor.          cholecalciferol 25 MCG (1000 UNIT) tablet Commonly known as: VITAMIN D3 Take 1,000 Units by mouth daily.   clonazePAM 0.5 MG tablet Commonly known as: KLONOPIN Take 1 tablet (0.5 mg total) by mouth daily as needed for anxiety.   lisinopril 10 MG tablet Commonly known as: ZESTRIL Take 1 tablet (10 mg total) by mouth daily.   Melatonin 10 MG Tabs Take 10 mg by mouth at bedtime.   metoprolol succinate 25 MG 24 hr tablet Commonly known as: Toprol XL Take 1.5 tablets (37.5 mg total) by mouth daily.   Multi-Vitamin tablet Take 1 tablet by mouth daily.   Omega-3 1000 MG Caps Take 1,000 mg by mouth daily.   rosuvastatin 10 MG tablet Commonly known as: CRESTOR Take 1 tablet (10 mg total) by mouth daily.  warfarin 1 MG tablet Commonly known as: COUMADIN Take as directed by the anticoagulation clinic. If you are unsure how to take this medication, talk to your nurse or doctor. Original instructions: Take 2 tablets by mouth once daily along with '5mg'$  tablet or as directed by Coumadin clinic   warfarin 5 MG tablet Commonly known as: COUMADIN Take as directed by the anticoagulation clinic. If you are unsure how to take this medication, talk to your nurse or doctor. Original instructions: Take 5 mg tablet by mouth once daily along with '1mg'$  tablet or as directed by the coumadin clinic         History (reviewed): Past Medical History:  Diagnosis Date   A-fib [facility])    Allergy    Latex   Anxiety 2006   Cancer [facility]) 2012   Prostate   Cataract 2017   Surgery both eyes   Depression 2006   DVT (deep venous thrombosis) (HCC)    Hypertension    Kidney stones 01/08/2020   Mixed hyperlipidemia 08/09/2021   Peripheral neuropathy 08/02/2012   Primary osteoarthritis of left hip 11/23/2021   Pulmonary embolism ([place])    Tobacco dependence 07/02/2019   Past Surgical History:  Procedure Laterality Date   CARDIOVERSION N/A 07/21/2022   Procedure: CARDIOVERSION;  Surgeon: Josue Hector, MD;  Location: [facility] ENDOSCOPY;  Service: Cardiovascular;  Laterality: N/A;   EYE SURGERY  2017   Cataracts   PROSTATECTOMY     TONSILLECTOMY     Family History  Problem Relation Age of Onset   Anxiety disorder Mother    Cancer Mother    Stroke Mother    Alcohol abuse Father    Early death Father    Anxiety disorder Brother    Depression Brother    Anxiety disorder Daughter    Depression Daughter    Social History   Socioeconomic History   Marital status: Married    Spouse name: Colon Branch   Number of children: 1   Years of education: 13   Highest education level: Some college, no degree  Occupational History   Occupation: Retired  Tobacco Use   Smoking status: Every Day    Packs/day: 0.50    Years: 10.00    Total pack years: 5.00    Types: Cigarettes   Smokeless tobacco: Never   Tobacco comments:    Quit 1990-2015  Vaping Use   Vaping Use: Never used  Substance and Sexual Activity   Alcohol use: Not Currently    Comment: No alcohol since 1995   Drug use: Never   Sexual activity: Not Currently    Birth control/protection: None  Other Topics Concern   Not on file  Social History Narrative   Lives with wife. He enjoys yardwork.   Social Determinants of Health   Financial Resource Strain: Low Risk  (08/19/2022)   Overall Financial Resource Strain (CARDIA)     Difficulty of Paying Living Expenses: Not hard at all  Food Insecurity: No Food Insecurity (08/19/2022)   Hunger Vital Sign    Worried About Running Out of Food in the Last Year: Never true    Ran Out of Food in the Last Year: Never true  Transportation Needs: No Transportation Needs (08/19/2022)   PRAPARE - Hydrologist (Medical): No    Lack of Transportation (Non-Medical): No  Physical Activity: Unknown (08/19/2022)   Exercise Vital Sign    Days of Exercise per Week: 4 days  Minutes of Exercise per Session: Patient refused  Stress: No Stress Concern Present (08/19/2022)   [place]    Feeling of Stress : Only a little  Social Connections: Moderately Integrated (08/22/2022)   Social Connection and Isolation Panel [NHANES]    Frequency of Communication with Friends and Family: Once a week    Frequency of Social Gatherings with Friends and Family: Once a week    Attends Religious Services: More than 4 times per year    Active Member of [employer] or Organizations: Yes    Attends Archivist Meetings: Patient refused    Marital Status: Married    Activities of Daily Living    08/22/2022    9:05 AM 08/19/2022    1:42 PM  In your present state of health, do you have any difficulty performing the following activities:  Hearing?  0  Vision?  0  Difficulty concentrating or making decisions?  0  Walking or climbing stairs?  0  Dressing or bathing?  0  Doing errands, shopping?  0  Preparing Food and eating ?  N  Using the Toilet?  N  In the past six months, have you accidently leaked urine? Y Y  Comment seldom   Do you have problems with loss of bowel control?  N  Managing your Medications?  N  Managing your Finances?  N  Housekeeping or managing your Housekeeping?  N    Patient Education/ Literacy How often do you need to have someone help you when you read instructions, pamphlets,  or other written materials from your doctor or pharmacy?: 1 - Never What is the last grade level you completed in school?: some college  Exercise Current Exercise Habits: The patient does not participate in regular exercise at present, Exercise limited by: None identified  Diet Patient reports consuming  2-3  meals a day and 2 snack(s) a day Patient reports that his primary diet is: Regular Patient reports that she does have regular access to food.   Depression Screen    08/22/2022    9:04 AM 08/09/2022    9:15 AM 02/09/2022    9:08 AM 11/15/2021    1:30 PM 08/09/2021    9:47 AM  PHQ 2/9 Scores  PHQ - 2 Score 0 0 0 0 0  PHQ- 9 Score 0 1 0 0      Fall Risk    08/22/2022    9:04 AM 08/19/2022    1:42 PM 08/18/2022    8:50 AM 08/09/2022    9:15 AM 11/15/2021    1:31 PM  [place] in the past year?  0 0 0 0  Number falls in past yr:  0 0 0 0  Injury with Fall?  0 0 0 0  Risk for fall due to : No Fall Risks   No Fall Risks   Follow up Falls evaluation completed   Falls evaluation completed Falls evaluation completed     Objective:  MONTRAIL MEHRER seemed alert and oriented and he participated appropriately during our telephone visit.  Blood Pressure Weight BMI  BP Readings from Last 3 Encounters:  08/19/22 120/60  08/09/22 132/84  07/21/22 122/81   Wt Readings from Last 3 Encounters:  08/19/22 197 lb 9.6 oz (89.6 kg)  08/09/22 196 lb 1.9 oz (89 kg)  07/21/22 196 lb 13.9 oz (89.3 kg)   BMI Readings from Last 1 Encounters:  08/19/22 26.07 kg/m    *  Unable to obtain current vital signs, weight, and BMI due to telephone visit type  Hearing/Vision  Shanon Brow did not seem to have difficulty with hearing/understanding during the telephone conversation Reports that he has had a formal eye exam by an eye care professional within the past year Reports that he has not had a formal hearing evaluation within the past year *Unable to fully assess hearing and vision during  telephone visit type  Cognitive Function:    08/22/2022    9:07 AM  6CIT Screen  What Year? 0 points  What month? 0 points  What time? 0 points  Count back from 20 0 points  Months in reverse 0 points  Repeat phrase 2 points  Total Score 2 points   (Normal:0-7, Significant for Dysfunction: >8)  Normal Cognitive Function Screening: Yes   Immunization & Health Maintenance Record Immunization History  Administered Date(s) Administered   Pneumococcal Conjugate-13 06/25/2015   Pneumococcal Polysaccharide-23 02/25/2014   Tdap 03/04/2014   Zoster Recombinat (Shingrix) 03/17/2018, 06/06/2018   Zoster, Live 10/31/2014    Health Maintenance  Topic Date Due   COLONOSCOPY (Pts 45-56yr Insurance coverage will need to be confirmed)  08/23/2023 (Originally 06/06/2022)   TETANUS/TDAP  03/04/2024   Pneumonia Vaccine 75 Years old  Completed   Hepatitis C Screening  Completed   Zoster Vaccines- Shingrix  Completed   HPV VACCINES  Aged Out   INFLUENZA VACCINE  Discontinued   COVID-19 Vaccine  Discontinued       Assessment  This is a routine wellness examination for [employer]  Health Maintenance: Due or Overdue There are no preventive care reminders to display for this patient.  [place] does not need a referral for Community Assistance: Care Management:   no Social Work:    no Prescription Assistance:  no Nutrition/Diabetes Education:  no   Plan:  Personalized Goals  Goals Addressed               This Visit's Progress     Patient Stated (pt-stated)        He would like to maintain his healthy lifestyle.       Personalized Health Maintenance & Screening Recommendations  Lung Cancer screening  Lung Cancer Screening Recommended: yes (Low Dose CT Chest recommended if Age 75-80years, 30 pack-year currently smoking OR have quit w/in past 15 years) Hepatitis C Screening recommended: no HIV Screening recommended: no  Advanced Directives: Written  information was not prepared per patient's request.  Referrals & Orders Orders Placed This Encounter  Procedures   Ambulatory referral to Gastroenterology (for Colonoscopy)   Ambulatory Referral Lung Cancer Screening [facility]    Follow-up Plan Follow-up with JSamuel Bouche NP as planned Medicare wellness visit in one year.  Patient will access AVS on my chart.   I have personally reviewed and noted the following in the patient's chart:   Medical and social history Use of alcohol, tobacco or illicit drugs  Current medications and supplements Functional ability and status Nutritional status Physical activity Advanced directives List of other physicians Hospitalizations, surgeries, and ER visits in previous 12 months Vitals Screenings to include cognitive, depression, and falls Referrals and appointments  In addition, I have reviewed and discussed with DErby Piancertain preventive protocols, quality metrics, and best practice recommendations. A written personalized care plan for preventive services as well as general preventive health recommendations is available and can be mailed to the patient at his request.  Tinnie Gens, RN BSN  08/22/2022

## 2022-08-22 NOTE — Patient Instructions (Addendum)
[place] Maintenance Summary and Written Plan of Care  Larry Odonnell ,  Thank you for allowing me to perform your Medicare Annual Wellness Visit and for your ongoing commitment to your health.   Health Maintenance & Immunization History Health Maintenance  Topic Date Due  . COLONOSCOPY (Pts 45-75yr Insurance coverage will need to be confirmed)  08/23/2023 (Originally 06/06/2022)  . TETANUS/TDAP  03/04/2024  . Pneumonia Vaccine 76 Years old  Completed  . Hepatitis C Screening  Completed  . Zoster Vaccines- Shingrix  Completed  . HPV VACCINES  Aged Out  . INFLUENZA VACCINE  Discontinued  . COVID-19 Vaccine  Discontinued   Immunization History  Administered Date(s) Administered  . Pneumococcal Conjugate-13 06/25/2015  . Pneumococcal Polysaccharide-23 02/25/2014  . Tdap 03/04/2014  . Zoster Recombinat (Shingrix) 03/17/2018, 06/06/2018  . Zoster, Live 10/31/2014    These are the patient goals that we discussed:  Goals Addressed              This Visit's Progress   .  Patient Stated (pt-stated)        He would like to maintain his healthy lifestyle.        This is a list of Health Maintenance Items that are overdue or due now: Lung Cancer screening  Orders/Referrals Placed Today: Orders Placed This Encounter  Procedures  . Ambulatory referral to Gastroenterology (for Colonoscopy)    Referral Priority:   Routine    Referral Type:   Consultation    Referral Reason:   Specialty Services Required    Number of Visits Requested:   1    (Contact our referral department at [phone] you have not spoken with someone about your referral appointment within the next 5 days)    Follow-up Plan Follow-up with JSamuel Bouche NP as planned Medicare wellness visit in one year.  Patient will access AVS on my chart.      Health Maintenance, Male Adopting a healthy lifestyle and getting preventive care are important in promoting health and  wellness. Ask your health care provider about: The right schedule for you to have regular tests and exams. Things you can do on your own to prevent diseases and keep yourself healthy. What should I know about diet, weight, and exercise? Eat a healthy diet  Eat a diet that includes plenty of vegetables, fruits, low-fat dairy products, and lean protein. Do not eat a lot of foods that are high in solid fats, added sugars, or sodium. Maintain a healthy weight Body mass index (BMI) is a measurement that can be used to identify possible weight problems. It estimates body fat based on height and weight. Your health care provider can help determine your BMI and help you achieve or maintain a healthy weight. Get regular exercise Get regular exercise. This is one of the most important things you can do for your health. Most adults should: Exercise for at least 150 minutes each week. The exercise should increase your heart rate and make you sweat (moderate-intensity exercise). Do strengthening exercises at least twice a week. This is in addition to the moderate-intensity exercise. Spend less time sitting. Even light physical activity can be beneficial. Watch cholesterol and blood lipids Have your blood tested for lipids and cholesterol at 75years of age, then have this test every 5 years. You may need to have your cholesterol levels checked more often if: Your lipid or cholesterol levels are high. You are older than 75years of age. You  are at high risk for heart disease. What should I know about cancer screening? Many types of cancers can be detected early and may often be prevented. Depending on your health history and family history, you may need to have cancer screening at various ages. This may include screening for: Colorectal cancer. Prostate cancer. Skin cancer. Lung cancer. What should I know about heart disease, diabetes, and high blood pressure? Blood pressure and heart disease High  blood pressure causes heart disease and increases the risk of stroke. This is more likely to develop in people who have high blood pressure readings or are overweight. Talk with your health care provider about your target blood pressure readings. Have your blood pressure checked: Every 3-5 years if you are 32-34 years of age. Every year if you are 57 years old or older. If you are between the ages of 37 and 29 and are a current or former smoker, ask your health care provider if you should have a one-time screening for abdominal aortic aneurysm (AAA). Diabetes Have regular diabetes screenings. This checks your fasting blood sugar level. Have the screening done: Once every three years after age 62 if you are at a normal weight and have a low risk for diabetes. More often and at a younger age if you are overweight or have a high risk for diabetes. What should I know about preventing infection? Hepatitis B If you have a higher risk for hepatitis B, you should be screened for this virus. Talk with your health care provider to find out if you are at risk for hepatitis B infection. Hepatitis C Blood testing is recommended for: Everyone born from [birth year] through 1964. Anyone with known risk factors for hepatitis C. Sexually transmitted infections (STIs) You should be screened each year for STIs, including gonorrhea and chlamydia, if: You are sexually active and are younger than 75 years of age. You are older than 75 years of age and your health care provider tells you that you are at risk for this type of infection. Your sexual activity has changed since you were last screened, and you are at increased risk for chlamydia or gonorrhea. Ask your health care provider if you are at risk. Ask your health care provider about whether you are at high risk for HIV. Your health care provider may recommend a prescription medicine to help prevent HIV infection. If you choose to take medicine to prevent HIV, you  should first get tested for HIV. You should then be tested every 3 months for as long as you are taking the medicine. Follow these instructions at home: Alcohol use Do not drink alcohol if your health care provider tells you not to drink. If you drink alcohol: Limit how much you have to 0-2 drinks a day. Know how much alcohol is in your drink. In the [place] one drink equals one 12 oz bottle of beer (355 mL), one 5 oz glass of wine (148 mL), or one 1 oz glass of hard liquor (44 mL). Lifestyle Do not use any products that contain nicotine or tobacco. These products include cigarettes, chewing tobacco, and vaping devices, such as e-cigarettes. If you need help quitting, ask your health care provider. Do not use street drugs. Do not share needles. Ask your health care provider for help if you need support or information about quitting drugs. General instructions Schedule regular health, dental, and eye exams. Stay current with your vaccines. Tell your health care provider if: You often feel depressed. You have  ever been abused or do not feel safe at home. Summary Adopting a healthy lifestyle and getting preventive care are important in promoting health and wellness. Follow your health care provider's instructions about healthy diet, exercising, and getting tested or screened for diseases. Follow your health care provider's instructions on monitoring your cholesterol and blood pressure. This information is not intended to replace advice given to you by your health care provider. Make sure you discuss any questions you have with your health care provider. Document Revised: 05/03/2021 Document Reviewed: 05/03/2021 Elsevier Patient Education  [place].

## 2022-08-23 ENCOUNTER — Encounter: Payer: Self-pay | Admitting: Medical-Surgical

## 2022-08-25 ENCOUNTER — Encounter: Payer: Self-pay | Admitting: Sports Medicine

## 2022-08-26 ENCOUNTER — Ambulatory Visit: Payer: Medicare Other | Attending: Cardiology

## 2022-08-26 DIAGNOSIS — I824Y9 Acute embolism and thrombosis of unspecified deep veins of unspecified proximal lower extremity: Secondary | ICD-10-CM | POA: Diagnosis not present

## 2022-08-26 DIAGNOSIS — Z5181 Encounter for therapeutic drug level monitoring: Secondary | ICD-10-CM | POA: Diagnosis not present

## 2022-08-26 DIAGNOSIS — I4891 Unspecified atrial fibrillation: Secondary | ICD-10-CM | POA: Diagnosis not present

## 2022-08-26 LAB — POCT INR: INR: 3.2 — AB (ref 2.0–3.0)

## 2022-08-26 NOTE — Patient Instructions (Signed)
Description   Only take 2.'5mg'$  today and then continue taking '7mg'$  daily except '5mg'$  on Mondays and Fridays. Stay consistent with greens each week ( 2-3 times per week).  Recheck INR in 3 weeks.  Anticoagulation Clinic [phone] or [phone]

## 2022-09-16 ENCOUNTER — Ambulatory Visit: Payer: Medicare Other | Attending: Cardiovascular Disease | Admitting: *Deleted

## 2022-09-16 DIAGNOSIS — I824Y9 Acute embolism and thrombosis of unspecified deep veins of unspecified proximal lower extremity: Secondary | ICD-10-CM | POA: Diagnosis not present

## 2022-09-16 DIAGNOSIS — I4891 Unspecified atrial fibrillation: Secondary | ICD-10-CM | POA: Diagnosis not present

## 2022-09-16 DIAGNOSIS — I2693 Single subsegmental pulmonary embolism without acute cor pulmonale: Secondary | ICD-10-CM

## 2022-09-16 DIAGNOSIS — Z5181 Encounter for therapeutic drug level monitoring: Secondary | ICD-10-CM

## 2022-09-16 LAB — POCT INR: INR: 2.8 (ref 2.0–3.0)

## 2022-09-16 NOTE — Patient Instructions (Signed)
Description   Continue taking '7mg'$  daily except '5mg'$  on Mondays and Fridays. Stay consistent with greens each week ( 2-3 times per week). Recheck INR in 4 weeks.  Anticoagulation Clinic [phone] or [phone]

## 2022-09-29 ENCOUNTER — Other Ambulatory Visit: Payer: Self-pay

## 2022-09-29 DIAGNOSIS — F17210 Nicotine dependence, cigarettes, uncomplicated: Secondary | ICD-10-CM

## 2022-09-29 DIAGNOSIS — Z122 Encounter for screening for malignant neoplasm of respiratory organs: Secondary | ICD-10-CM

## 2022-09-29 DIAGNOSIS — Z87891 Personal history of nicotine dependence: Secondary | ICD-10-CM

## 2022-10-04 ENCOUNTER — Encounter: Payer: Self-pay | Admitting: Internal Medicine

## 2022-10-04 ENCOUNTER — Encounter: Payer: Self-pay | Admitting: Medical-Surgical

## 2022-10-04 ENCOUNTER — Ambulatory Visit: Payer: Medicare Other | Admitting: Internal Medicine

## 2022-10-04 VITALS — BP 130/80 | HR 65 | Ht 73.0 in | Wt 197.0 lb

## 2022-10-04 DIAGNOSIS — Z86010 Personal history of colonic polyps: Secondary | ICD-10-CM | POA: Diagnosis not present

## 2022-10-04 NOTE — Progress Notes (Signed)
Chief Complaint: Colon cancer screening  HPI : 76 year old male with history of A-fib on warfarin, PE/DVT, prostate cancer presents with colon cancer screening  Denies blood in stools, changes in bowel habits, and weight loss. In the past he would experience diarrhea for years. He had been on venlafaxine for a long time. He was weaned off of venlafaxine in the spring of this year. Off of the venlafaxine, his diarrhea has resolved. Thus he is suspicious that the venlafaxine was the source of his diarrhea even though he had been on venlafaxine for a long time. He last had a colonoscopy in 05/2017 with one precancerous polyp and was recommended for 5 year follow up.  Maternal aunt had colon cancer.  Past Medical History:  Diagnosis Date   A-fib [facility])    Allergy    Latex   Anxiety 2006   Cancer [facility]) 2012   Prostate   Cataract 2017   Surgery both eyes   Depression 2006   DVT (deep venous thrombosis) (HCC)    Hypertension    Kidney stones 01/08/2020   Mixed hyperlipidemia 08/09/2021   Peripheral neuropathy 08/02/2012   Primary osteoarthritis of left hip 11/23/2021   Pulmonary embolism ([place])    Tobacco dependence 07/02/2019     Past Surgical History:  Procedure Laterality Date   CARDIOVERSION N/A 07/21/2022   Procedure: CARDIOVERSION;  Surgeon: Josue Hector, MD;  Location: [facility] ENDOSCOPY;  Service: Cardiovascular;  Laterality: N/A;   EYE SURGERY  2017   Cataracts   PROSTATECTOMY     TONSILLECTOMY     Family History  Problem Relation Age of Onset   Anxiety disorder Mother    Cancer Mother    Stroke Mother    Alcohol abuse Father    Early death Father    Anxiety disorder Brother    Depression Brother    Anxiety disorder Daughter    Depression Daughter    Esophageal cancer Neg Hx    Social History   Tobacco Use   Smoking status: Every Day    Packs/day: 0.50    Years: 10.00    Total pack years: 5.00    Types: Cigarettes   Smokeless tobacco: Never   Tobacco comments:     Quit 1990-2015  Vaping Use   Vaping Use: Never used  Substance Use Topics   Alcohol use: Not Currently    Comment: No alcohol since 1995   Drug use: Never   Current Outpatient Medications  Medication Sig Dispense Refill   cholecalciferol (VITAMIN D) 25 MCG (1000 UNIT) tablet Take 1,000 Units by mouth daily.     clonazePAM (KLONOPIN) 0.5 MG tablet Take 1 tablet (0.5 mg total) by mouth daily as needed for anxiety. 30 tablet 2   lisinopril (ZESTRIL) 10 MG tablet Take 1 tablet (10 mg total) by mouth daily. 90 tablet 3   Melatonin 10 MG TABS Take 10 mg by mouth at bedtime.     metoprolol succinate (TOPROL XL) 25 MG 24 hr tablet Take 1.5 tablets (37.5 mg total) by mouth daily. 135 tablet 2   Multiple Vitamin (MULTI-VITAMIN) tablet Take 1 tablet by mouth daily.     Omega-3 1000 MG CAPS Take 1,000 mg by mouth daily.     rosuvastatin (CRESTOR) 10 MG tablet Take 1 tablet (10 mg total) by mouth daily. 90 tablet 3   warfarin (COUMADIN) 1 MG tablet Take 2 tablets by mouth once daily along with '5mg'$  tablet or as directed by Coumadin clinic  180 tablet 1   warfarin (COUMADIN) 5 MG tablet Take 5 mg tablet by mouth once daily along with '1mg'$  tablet or as directed by the coumadin clinic 100 tablet 1   No current facility-administered medications for this visit.   Allergies  Allergen Reactions   Atorvastatin Other (See Comments)    Pt reports causes leg pains and leg heaviness   Oxycodone Other (See Comments)    Causes extreme dizziness and falling down   Latex Rash     Review of Systems: All systems reviewed and negative except where noted in HPI.   Physical Exam: BP 130/80   Pulse 65   Ht '6\' 1"'$  (1.854 m)   Wt 197 lb (89.4 kg)   SpO2 98%   BMI 25.99 kg/m  Constitutional: Pleasant,well-developed, male in no acute distress. HEENT: Normocephalic and atraumatic. Conjunctivae are normal. No scleral icterus. Cardiovascular: Normal rate  Pulmonary/chest: Effort normal Abdominal: Soft,  nondistended, nontender.  Extremities: No edema Neurological: Alert and oriented to person place and time. Skin: Skin is warm and dry. No rashes noted. Psychiatric: Normal mood and affect. Behavior is normal.  Colonoscopy 06/06/17: One 8 mm rectosigmoid polyp that was resected with hot snare. 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 External and internal hemorrhoids Path: TA  ASSESSMENT AND PLAN: History of colon polyps Patient last had a colonoscopy in 2018 with one small TA that was removed. Prior to that he had a normal colonoscopy in 2008. He is not having any symptoms at this time. I went over the risks and benefits of polyp surveillance with the patient in detail. The benefits would include prevention of colon cancer in the future. The risks would include procedural related risks, sedation risks, and the risk of coming off of warfarin temporarily for the procedure. I did tell the patient that based upon my assessment of his age, medical history, and history of colon polyps, I would recommend continuing with one additional colonoscopy. With the above information, patient has opted to stop polyp surveillance at this time.  - Patient declined to schedule colonoscopy for polyp surveillance  Christia Reading, MD

## 2022-10-04 NOTE — Patient Instructions (Addendum)
If you are age 75 or older, your body mass index should be between 23-30. Your Body mass index is 25.99 kg/m. If this is out of the aforementioned range listed, please consider follow up with your Primary Care Provider.  If you are age 44 or younger, your body mass index should be between 19-25. Your Body mass index is 25.99 kg/m. If this is out of the aformentioned range listed, please consider follow up with your Primary Care Provider.   ________________________________________________________  It has been recommended to you by your physician that you have a(n) colonoscopy completed. Per your request, we did not schedule the procedure(s) today. Please contact our office at [phone] should you decide to have the procedure completed. You will be scheduled for a pre-visit and procedure at that time.     Thank you for entrusting me with your care and for choosing [facility], Dr. Christia Reading

## 2022-10-14 ENCOUNTER — Ambulatory Visit: Payer: Medicare Other | Attending: Cardiology | Admitting: *Deleted

## 2022-10-14 DIAGNOSIS — Z5181 Encounter for therapeutic drug level monitoring: Secondary | ICD-10-CM | POA: Diagnosis not present

## 2022-10-14 DIAGNOSIS — I824Y9 Acute embolism and thrombosis of unspecified deep veins of unspecified proximal lower extremity: Secondary | ICD-10-CM | POA: Diagnosis not present

## 2022-10-14 DIAGNOSIS — I4891 Unspecified atrial fibrillation: Secondary | ICD-10-CM

## 2022-10-14 LAB — POCT INR: POC INR: 3.6

## 2022-10-14 NOTE — Patient Instructions (Signed)
Description   Hold warfarin today, then continue taking '7mg'$  daily except '5mg'$  on Mondays and Fridays. Stay consistent with greens each week ( 2-3 times per week). Recheck INR in 3 weeks.  Anticoagulation Clinic [phone] or [phone]

## 2022-10-19 ENCOUNTER — Telehealth (INDEPENDENT_AMBULATORY_CARE_PROVIDER_SITE_OTHER): Payer: Medicare Other | Admitting: Physician Assistant

## 2022-10-19 ENCOUNTER — Encounter: Payer: Self-pay | Admitting: Physician Assistant

## 2022-10-19 VITALS — BP 175/75 | HR 80 | Temp 100.1°F | Ht 73.0 in | Wt 197.0 lb

## 2022-10-19 DIAGNOSIS — R52 Pain, unspecified: Secondary | ICD-10-CM | POA: Diagnosis not present

## 2022-10-19 DIAGNOSIS — U071 COVID-19: Secondary | ICD-10-CM | POA: Diagnosis not present

## 2022-10-19 DIAGNOSIS — R509 Fever, unspecified: Secondary | ICD-10-CM | POA: Diagnosis not present

## 2022-10-19 DIAGNOSIS — R5383 Other fatigue: Secondary | ICD-10-CM

## 2022-10-19 MED ORDER — NIRMATRELVIR/RITONAVIR (PAXLOVID)TABLET: 3.0000 | ORAL_TABLET | Freq: Two times a day (BID) | ORAL | 0 refills | Status: DC

## 2022-10-19 NOTE — Progress Notes (Signed)
Symptoms started Monday AM Fever  Weakness Lethargic No appetite Mild headache  Covid + this AM  Taking tylenol for symptoms, no other medications

## 2022-10-19 NOTE — Progress Notes (Signed)
Patient made aware to stop Crestor for 2 weeks. Appt made to check INR on Monday. He has started Paxlovid today.

## 2022-10-19 NOTE — Progress Notes (Signed)
..Virtual Visit via Video Note  I connected with Larry Odonnell on 10/19/22 at 10:10 AM EDT by a video enabled telemedicine application and verified that I am speaking with the correct person using two identifiers.  Location: Patient: home Provider: clinic  .Marland KitchenParticipating in visit:  Patient: Larry Odonnell Provider: Iran Planas PA-C   I discussed the limitations of evaluation and management by telemedicine and the availability of in person appointments. The patient expressed understanding and agreed to proceed.  History of Present Illness: Pt is a 76 yo male with A.fib, HTN, hx of PE who tested positive for covid this am. Symptoms started Monday with fever, chills, body aches, loose stools and no appetitie. No SOB or cough.Lives with wife but she has no symptoms. Not ever had covid and not vaccinated.   .. Active Ambulatory Problems    Diagnosis Date Noted   Atrial fibrillation ([place]) 03/23/2021   Acute venous embolism and thrombosis of deep vessels of proximal lower extremity ([place]) 03/23/2021   Pulmonary embolus ([place]) 03/23/2021   Primary hypertension 08/09/2021   Mixed hyperlipidemia 08/09/2021   Anxiety with depression 08/09/2021   Primary osteoarthritis of left hip 11/23/2021   Left shoulder pain 11/23/2021   Left flank pain 07/15/2022   Urinary urgency 07/15/2022   Dysuria 07/15/2022   Anticoagulated 07/15/2022   History of kidney stones 07/15/2022   Atrial fibrillation by electrocardiogram ([place]) 09/22/2020   Back pain 07/05/2012   Encounter for monitoring warfarin therapy 07/22/2019   History of DVT (deep vein thrombosis) 12/26/2005   History of prostate cancer 08/02/2012   Kidney stones 01/08/2020   Long term (current) use of anticoagulants 06/03/2019   Longstanding persistent atrial fibrillation ([place]) 09/23/2020   Other insomnia 10/24/2019   Peripheral neuropathy 08/02/2012   Tobacco dependence 07/02/2019   Resolved Ambulatory Problems    Diagnosis Date Noted   No  Resolved Ambulatory Problems   Past Medical History:  Diagnosis Date   A-fib [facility])    Allergy    Anxiety 2006   Cancer [facility]) 2012   Cataract 2017   Depression 2006   DVT (deep venous thrombosis) (HCC)    History of colon polyps    Hypertension    Pulmonary embolism (HCC)     Observations/Objective: No acute distress Normal breathing Normal mood and appearance  .Marland Kitchen Today's Vitals   10/19/22 0953  BP: (!) 175/75  Pulse: 80  Temp: 100.1 F (37.8 C)  TempSrc: Oral  Weight: 197 lb (89.4 kg)  Height: '6\' 1"'$  (1.854 m)   Body mass index is 25.99 kg/m.    Assessment and Plan: Marland KitchenMarland KitchenArnell was seen today for covid positive.  Diagnoses and all orders for this visit:  COVID-19 virus infection -     nirmatrelvir/ritonavir EUA (PAXLOVID) 20 x 150 MG & 10 x '100MG'$  TABS; Take 3 tablets by mouth 2 (two) times daily for 5 days. (Take nirmatrelvir 150 mg two tablets twice daily for 5 days and ritonavir 100 mg one tablet twice daily for 5 days) Patient GFR is 72.  Other fatigue  Body aches  Fever, unspecified fever cause   BP up today Pt does have a lot of risk factors for covid infection complications and has not been vaccinated GFR 72 Start paxlovid. Stop crestor for 2 weeks. Check INR in 5 days to make sure stable and not too low.  Quarantine for 5 days, mask for 10 days Discussed red flag signs and symptoms and when to go to ED/UC.  Rest and hydrate  Follow up as needed or with any concerns.   Spoke with pharmacist, Larinda Buttery and confirmed safety.    Follow Up Instructions:    I discussed the assessment and treatment plan with the patient. The patient was provided an opportunity to ask questions and all were answered. The patient agreed with the plan and demonstrated an understanding of the instructions.   The patient was advised to call back or seek an in-person evaluation if the symptoms worsen or if the condition fails to improve as anticipated.    Iran Planas,  PA-C

## 2022-10-24 ENCOUNTER — Ambulatory Visit (INDEPENDENT_AMBULATORY_CARE_PROVIDER_SITE_OTHER): Payer: Medicare Other | Admitting: Medical-Surgical

## 2022-10-24 VITALS — BP 137/66 | HR 78 | Ht 73.0 in | Wt 190.0 lb

## 2022-10-24 DIAGNOSIS — I4891 Unspecified atrial fibrillation: Secondary | ICD-10-CM

## 2022-10-24 LAB — POCT INR: POC INR: 2.7

## 2022-10-24 NOTE — Patient Instructions (Signed)
Continue taking 7 mg daily with the exception of Monday and Friday take 5 mg.

## 2022-10-24 NOTE — Progress Notes (Signed)
Agree with documentation as below.  ___________________________________________ Jakarius Flamenco L. Tocarra Gassen, DNP, APRN, FNP-BC Primary Care and Sports Medicine [facility]  

## 2022-10-24 NOTE — Progress Notes (Signed)
Pt here for INR check no missed doses, diet or medication changes,ABX use,bruising, abnormal bleeding, blood in urine or stool, CP,SOB, hospitalization, or dental procedures.   Pt advised to continue current dosing. He stated that he is going to continue following up with cardiology. He has an appointment in 2 weeks.

## 2022-10-26 ENCOUNTER — Encounter: Payer: Self-pay | Admitting: Medical-Surgical

## 2022-11-01 ENCOUNTER — Encounter: Payer: Self-pay | Admitting: Acute Care

## 2022-11-01 ENCOUNTER — Ambulatory Visit (INDEPENDENT_AMBULATORY_CARE_PROVIDER_SITE_OTHER): Payer: Medicare Other | Admitting: Acute Care

## 2022-11-01 DIAGNOSIS — F17210 Nicotine dependence, cigarettes, uncomplicated: Secondary | ICD-10-CM | POA: Diagnosis not present

## 2022-11-01 NOTE — Progress Notes (Signed)
Virtual Visit via Telephone Note  I connected with Larry Odonnell on 11/01/22 at 10:30 AM EST by telephone and verified that I am speaking with the correct person using two identifiers.  Location: Patient: At home Provider:  [place], [place], [place], [address]    I discussed the limitations, risks, security and privacy concerns of performing an evaluation and management service by telephone and the availability of in person appointments. I also discussed with the patient that there may be a patient responsible charge related to this service. The patient expressed understanding and agreed to proceed.   Shared Decision Making Visit Lung Cancer Screening Program [phone])   Eligibility: Age 76 y.o. Pack Years Smoking History Calculation 59 pack year smoking history (# packs/per year x # years smoked) Recent History of coughing up blood  no Unexplained weight loss? no ( >Than 15 pounds within the last 6 months ) Prior History Lung / other cancer no (Diagnosis within the last 5 years already requiring surveillance chest CT Scans). Smoking Status Current Smoker Former Smokers: Years since quit: NA  Quit Date: NA  Visit Components: Discussion included one or more decision making aids. yes Discussion included risk/benefits of screening. yes Discussion included potential follow up diagnostic testing for abnormal scans. yes Discussion included meaning and risk of over diagnosis. yes Discussion included meaning and risk of False Positives. yes Discussion included meaning of total radiation exposure. yes  Counseling Included: Importance of adherence to annual lung cancer LDCT screening. yes Impact of comorbidities on ability to participate in the program. yes Ability and willingness to under diagnostic treatment. yes  Smoking Cessation Counseling: Current Smokers:  Discussed importance of smoking cessation. yes Information about tobacco cessation classes and  interventions provided to patient. yes Patient provided with "ticket" for LDCT Scan. yes Symptomatic Patient. no  Counseling NA Diagnosis Code: Tobacco Use Z72.0 Asymptomatic Patient yes  Counseling (Intermediate counseling: > three minutes counseling) Q7619 Former Smokers:  Discussed the importance of maintaining cigarette abstinence. yes Diagnosis Code: Personal History of Nicotine Dependence. J09.326 Information about tobacco cessation classes and interventions provided to patient. Yes Patient provided with "ticket" for LDCT Scan. yes Written Order for Lung Cancer Screening with LDCT placed in Epic. Yes (CT Chest Lung Cancer Screening Low Dose W/O CM) [id number] Z12.2-Screening of respiratory organs Z87.891-Personal history of nicotine dependence   I have spent 25 minutes of face to face/ virtual visit   time with  Larry Odonnell discussing the risks and benefits of lung cancer screening. We viewed / discussed a power point together that explained in detail the above noted topics. We paused at intervals to allow for questions to be asked and answered to ensure understanding.We discussed that the single most powerful action that he can take to decrease his risk of developing lung cancer is to quit smoking. We discussed whether or not he is ready to commit to setting a quit date. We discussed options for tools to aid in quitting smoking including nicotine replacement therapy, non-nicotine medications, support groups, Quit Smart classes, and behavior modification. We discussed that often times setting smaller, more achievable goals, such as eliminating 1 cigarette a day for a week and then 2 cigarettes a day for a week can be helpful in slowly decreasing the number of cigarettes smoked. This allows for a sense of accomplishment as well as providing a clinical benefit. I provided  him  with smoking cessation  information  with contact information for community resources, classes, free nicotine  replacement therapy, and access to mobile apps, text messaging, and on-line smoking cessation help. I have also provided  him  the office contact information in the event he needs to contact me, or the screening staff. We discussed the time and location of the scan, and that either Doroteo Glassman RN, Joella Prince, RN  or I will call / send a letter with the results within 24-72 hours of receiving them. The patient verbalized understanding of all of  the above and had no further questions upon leaving the office. They have my contact information in the event they have any further questions.  I spent 3 minutes counseling on smoking cessation and the health risks of continued tobacco abuse.  I explained to the patient that there has been a high incidence of coronary artery disease noted on these exams. I explained that this is a non-gated exam therefore degree or severity cannot be determined. This patient is on statin therapy. I have asked the patient to follow-up with their PCP regarding any incidental finding of coronary artery disease and management with diet or medication as their PCP  feels is clinically indicated. The patient verbalized understanding of the above and had no further questions upon completion of the visit.      Magdalen Spatz, NP 11/01/2022

## 2022-11-01 NOTE — Patient Instructions (Signed)
Thank you for participating in the [facility]ening [facility]. It was our pleasure to meet you today. We will call you with the results of your scan within the next few days. Your scan will be assigned a Lung RADS category score by the physicians reading the scans.  This Lung RADS score determines follow up scanning.  See below for description of categories, and follow up screening recommendations. We will be in touch to schedule your follow up screening annually or based on recommendations of our providers. We will fax a copy of your scan results to your Primary Care Physician, or the physician who referred you to the program, to ensure they have the results. Please call the office if you have any questions or concerns regarding your scanning experience or results.  Our office number is [phone]. Please speak with Denise Phelps, RN. , or  Denise Buckner RN, They are  our Lung Cancer Screening RN.'s If They are unavailable when you call, Please leave a message on the voice mail. We will return your call at our earliest convenience.This voice mail is monitored several times a day.  Remember, if your scan is normal, we will scan you annually as long as you continue to meet the criteria for the program. (Age 55-77, Current smoker or smoker who has quit within the last 15 years). If you are a smoker, remember, quitting is the single most powerful action that you can take to decrease your risk of lung cancer and other pulmonary, breathing related problems. We know quitting is hard, and we are here to help.  Please let us know if there is anything we can do to help you meet your goal of quitting. If you are a former smoker, congratulations. We are proud of you! Remain smoke free! Remember you can refer friends or family members through the number above.  We will screen them to make sure they meet criteria for the program. Thank you for helping us take better care of you by  participating in Lung Screening.  You can receive free nicotine replacement therapy ( patches, gum or mints) by calling 1-800-QUIT NOW. Please call so we can get you on the path to becoming  a non-smoker. I know it is hard, but you can do this!  Lung RADS Categories:  Lung RADS 1: no nodules or definitely non-concerning nodules.  Recommendation is for a repeat annual scan in 12 months.  Lung RADS 2:  nodules that are non-concerning in appearance and behavior with a very low likelihood of becoming an active cancer. Recommendation is for a repeat annual scan in 12 months.  Lung RADS 3: nodules that are probably non-concerning , includes nodules with a low likelihood of becoming an active cancer.  Recommendation is for a 6-month repeat screening scan. Often noted after an upper respiratory illness. We will be in touch to make sure you have no questions, and to schedule your 6-month scan.  Lung RADS 4 A: nodules with concerning findings, recommendation is most often for a follow up scan in 3 months or additional testing based on our provider's assessment of the scan. We will be in touch to make sure you have no questions and to schedule the recommended 3 month follow up scan.  Lung RADS 4 B:  indicates findings that are concerning. We will be in touch with you to schedule additional diagnostic testing based on our provider's  assessment of the scan.  Other options for assistance in smoking cessation (   As covered by your insurance benefits)  Hypnosis for smoking cessation  [employer] [phone]  Acupuncture for smoking cessation  [facility] [phone]   

## 2022-11-02 ENCOUNTER — Ambulatory Visit (INDEPENDENT_AMBULATORY_CARE_PROVIDER_SITE_OTHER): Payer: Medicare Other

## 2022-11-02 DIAGNOSIS — Z122 Encounter for screening for malignant neoplasm of respiratory organs: Secondary | ICD-10-CM

## 2022-11-02 DIAGNOSIS — F17210 Nicotine dependence, cigarettes, uncomplicated: Secondary | ICD-10-CM

## 2022-11-02 DIAGNOSIS — Z87891 Personal history of nicotine dependence: Secondary | ICD-10-CM

## 2022-11-04 ENCOUNTER — Ambulatory Visit: Payer: Medicare Other | Attending: Cardiovascular Disease

## 2022-11-04 DIAGNOSIS — Z5181 Encounter for therapeutic drug level monitoring: Secondary | ICD-10-CM | POA: Diagnosis not present

## 2022-11-04 DIAGNOSIS — I824Y9 Acute embolism and thrombosis of unspecified deep veins of unspecified proximal lower extremity: Secondary | ICD-10-CM

## 2022-11-04 DIAGNOSIS — I4891 Unspecified atrial fibrillation: Secondary | ICD-10-CM | POA: Diagnosis not present

## 2022-11-04 DIAGNOSIS — I2693 Single subsegmental pulmonary embolism without acute cor pulmonale: Secondary | ICD-10-CM

## 2022-11-04 LAB — POCT INR: INR: 3.6 — AB (ref 2.0–3.0)

## 2022-11-04 NOTE — Patient Instructions (Signed)
Hold warfarin today, then continue taking '7mg'$  daily except '5mg'$  on Mondays and Fridays. Stay consistent with greens each week ( 2-3 times per week). Recheck INR in 4 weeks.  Anticoagulation Clinic [phone] or [phone]

## 2022-11-07 ENCOUNTER — Encounter: Payer: Self-pay | Admitting: Medical-Surgical

## 2022-11-07 ENCOUNTER — Other Ambulatory Visit: Payer: Self-pay

## 2022-11-07 DIAGNOSIS — I700 Atherosclerosis of aorta: Secondary | ICD-10-CM | POA: Insufficient documentation

## 2022-11-07 DIAGNOSIS — Z122 Encounter for screening for malignant neoplasm of respiratory organs: Secondary | ICD-10-CM

## 2022-11-07 DIAGNOSIS — Z87891 Personal history of nicotine dependence: Secondary | ICD-10-CM

## 2022-11-07 DIAGNOSIS — J439 Emphysema, unspecified: Secondary | ICD-10-CM

## 2022-11-07 DIAGNOSIS — F17210 Nicotine dependence, cigarettes, uncomplicated: Secondary | ICD-10-CM

## 2022-11-07 HISTORY — DX: Emphysema, unspecified: J43.9

## 2022-11-23 ENCOUNTER — Ambulatory Visit: Payer: Medicare Other | Admitting: Physician Assistant

## 2022-11-27 ENCOUNTER — Encounter: Payer: Self-pay | Admitting: Medical-Surgical

## 2022-11-29 ENCOUNTER — Ambulatory Visit: Payer: Medicare Other | Admitting: Family Medicine

## 2022-11-29 ENCOUNTER — Encounter: Payer: Self-pay | Admitting: Family Medicine

## 2022-11-29 VITALS — BP 139/63 | HR 68 | Ht 73.0 in | Wt 192.1 lb

## 2022-11-29 DIAGNOSIS — H5711 Ocular pain, right eye: Secondary | ICD-10-CM

## 2022-11-29 MED ORDER — ERYTHROMYCIN 5 MG/GM OP OINT: 1.0000 | TOPICAL_OINTMENT | Freq: Three times a day (TID) | OPHTHALMIC | 0 refills | Status: DC

## 2022-11-29 NOTE — Patient Instructions (Signed)
Call if not better in one week. We can then refer you to Ophthalmology

## 2022-11-29 NOTE — Progress Notes (Signed)
   Acute Office Visit  Subjective:     Patient ID: Larry Odonnell, male    DOB: Jul 04, 1946, 76 y.o.   MRN: [id number]  Chief Complaint  Patient presents with   Eye Pain    HPI Patient is in today for right  eye pain.  Been blowing leaves and was grinding some metal with a Dremel recently.  The pain started on Thursday.  He says its not so much as the pain is a discomfort he says if he closes his eye completely it feels better but if he is open and looking around he notices a discomfort mostly over the lower part of the eye.  He has been noting that the lids are sticking together when he wakes up in the morning and its been watering more.  ROS      Objective:    BP 139/63 (BP Location: Left Arm, Patient Position: Sitting, Cuff Size: Large)   Pulse 68   Ht '6\' 1"'$  (1.854 m)   Wt 192 lb 1.9 oz (87.1 kg)   SpO2 98%   BMI 25.35 kg/m    Physical Exam Vitals reviewed.  Constitutional:      Appearance: He is well-developed.  HENT:     Head: Normocephalic and atraumatic.     Comments: Is a little bit of inflammation of the conjunctive a on the right I.  A fluorescein stain was placed in the eye with some tetracaine numbing drops.  Black light used to evaluate.  I did not visualize any excoriation over the cornea and no foreign body that I could see.  Extraocular movements are intact.  No lid edema. Eyes:     Conjunctiva/sclera: Conjunctivae normal.  Cardiovascular:     Rate and Rhythm: Normal rate.  Pulmonary:     Effort: Pulmonary effort is normal.  Skin:    General: Skin is dry.     Coloration: Skin is not pale.  Neurological:     Mental Status: He is alert and oriented to person, place, and time.  Psychiatric:        Behavior: Behavior normal.     No results found for any visits on 11/29/22.      Assessment & Plan:   Problem List Items Addressed This Visit   None Visit Diagnoses     Ocular pain, right eye    -  Primary   Relevant Medications   erythromycin  ophthalmic ointment      Right eye discomfort. Will tx with erythromycin op oint. If not better in one week will refer to [facility] for further work up and evaluation.   Meds ordered this encounter  Medications   erythromycin ophthalmic ointment    Sig: Place 1 Application into the right eye 3 (three) times daily. X 5-7 days    Dispense:  3.5 g    Refill:  0    Return if symptoms worsen or fail to improve.  Beatrice Lecher, MD

## 2022-12-02 ENCOUNTER — Ambulatory Visit: Payer: Medicare Other | Attending: Cardiology | Admitting: *Deleted

## 2022-12-02 DIAGNOSIS — Z5181 Encounter for therapeutic drug level monitoring: Secondary | ICD-10-CM | POA: Diagnosis not present

## 2022-12-02 DIAGNOSIS — I2693 Single subsegmental pulmonary embolism without acute cor pulmonale: Secondary | ICD-10-CM

## 2022-12-02 DIAGNOSIS — I824Y9 Acute embolism and thrombosis of unspecified deep veins of unspecified proximal lower extremity: Secondary | ICD-10-CM

## 2022-12-02 DIAGNOSIS — I4891 Unspecified atrial fibrillation: Secondary | ICD-10-CM | POA: Diagnosis not present

## 2022-12-02 LAB — POCT INR: INR: 3.7 — AB (ref 2.0–3.0)

## 2022-12-02 NOTE — Patient Instructions (Addendum)
Description   Hold warfarin today, then start taking '7mg'$  daily except '5mg'$  on Mondays, Wednesdays, and Fridays. Stay consistent with greens each week ( 2-3 times per week). Recheck INR in 4 weeks. Anticoagulation Clinic [phone] or [phone]

## 2022-12-20 ENCOUNTER — Ambulatory Visit (INDEPENDENT_AMBULATORY_CARE_PROVIDER_SITE_OTHER): Payer: Medicare Other

## 2022-12-20 ENCOUNTER — Ambulatory Visit: Payer: Medicare Other | Admitting: Sports Medicine

## 2022-12-20 DIAGNOSIS — M1612 Unilateral primary osteoarthritis, left hip: Secondary | ICD-10-CM | POA: Diagnosis not present

## 2022-12-20 MED ORDER — TRIAMCINOLONE ACETONIDE 40 MG/ML IJ SUSP
40.0000 mg | Freq: Once | INTRAMUSCULAR | Status: AC
  Administered 2022-12-20: 40 mg via INTRAMUSCULAR

## 2022-12-20 NOTE — Assessment & Plan Note (Addendum)
Pleasant 76 year old male, known hip osteoarthritis, last injected June of this year, return of pain, reinjection left hip joint as above, return to see me as needed. Of note he is interested in surgical consultation which I think is highly appropriate, he has had longstanding x-ray confirmed osteoarthritis that has responded only temporarily to ultrasound-guided interventions, home conditioning, analgesics. It is limiting in his activities of daily living, we will do a referral to Dr. Berenice Primas.

## 2022-12-20 NOTE — Addendum Note (Signed)
Addended by: Tarri Glenn A on: 12/20/2022 01:51 PM   Modules accepted: Orders

## 2022-12-20 NOTE — Progress Notes (Signed)
    Procedures performed today:    Procedure: Real-time Ultrasound Guided injection of the left hip joint Device: Samsung HS60  Verbal informed consent obtained.  Time-out conducted.  Noted no overlying erythema, induration, or other signs of local infection.  Skin prepped in a sterile fashion.  Local anesthesia: Topical Ethyl chloride.  With sterile technique and under real time ultrasound guidance: Arthritic joint noted, 1 cc Kenalog 40, 2 cc lidocaine, 2 cc bupivacaine injected easily Completed without difficulty  Advised to call if fevers/chills, erythema, induration, drainage, or persistent bleeding.  Images permanently stored and available for review in PACS.  Impression: Technically successful ultrasound guided injection.  Independent interpretation of notes and tests performed by another provider:   None.  Brief History, Exam, Impression, and Recommendations:    Primary osteoarthritis of left hip Pleasant 76 year old male, known hip osteoarthritis, last injected June of this year, return of pain, reinjection left hip joint as above, return to see me as needed. Of note he is interested in surgical consultation which I think is highly appropriate, he has had longstanding x-ray confirmed osteoarthritis that has responded only temporarily to ultrasound-guided interventions, home conditioning, analgesics. It is limiting in his activities of daily living, we will do a referral to Dr. Berenice Primas.    ____________________________________________ Gwen Her. Dianah Field, M.D., ABFM., CAQSM., AME. Primary Care and Sports Medicine [facility]  Adjunct Professor of [employer]  Risk manager

## 2022-12-28 ENCOUNTER — Ambulatory Visit: Payer: Medicare Other

## 2023-01-05 ENCOUNTER — Encounter: Payer: Self-pay | Admitting: Cardiology

## 2023-01-05 ENCOUNTER — Encounter: Payer: Self-pay | Admitting: Medical-Surgical

## 2023-01-06 ENCOUNTER — Ambulatory Visit: Payer: Medicare Other | Attending: Cardiology

## 2023-01-06 DIAGNOSIS — I4891 Unspecified atrial fibrillation: Secondary | ICD-10-CM

## 2023-01-06 DIAGNOSIS — I824Y9 Acute embolism and thrombosis of unspecified deep veins of unspecified proximal lower extremity: Secondary | ICD-10-CM | POA: Diagnosis not present

## 2023-01-06 DIAGNOSIS — Z5181 Encounter for therapeutic drug level monitoring: Secondary | ICD-10-CM

## 2023-01-06 LAB — POCT INR: INR: 3 (ref 2.0–3.0)

## 2023-01-06 NOTE — Patient Instructions (Signed)
Description   Continue taking '7mg'$  daily except '5mg'$  on Mondays, Wednesdays, and Fridays. Stay consistent with greens each week ( 2-3 times per week).  Recheck INR in 5 weeks. Anticoagulation Clinic [phone] Cardiac Clearance needed prior to procedure.

## 2023-01-09 ENCOUNTER — Other Ambulatory Visit: Payer: Self-pay

## 2023-01-09 DIAGNOSIS — Z01812 Encounter for preprocedural laboratory examination: Secondary | ICD-10-CM

## 2023-01-09 DIAGNOSIS — I4891 Unspecified atrial fibrillation: Secondary | ICD-10-CM

## 2023-01-09 MED ORDER — WARFARIN SODIUM 1 MG PO TABS: ORAL_TABLET | ORAL | 1 refills | Status: DC

## 2023-01-09 MED ORDER — METOPROLOL SUCCINATE ER 25 MG PO TB24: 37.5000 mg | ORAL_TABLET | Freq: Every day | ORAL | 1 refills | Status: DC

## 2023-01-09 MED ORDER — WARFARIN SODIUM 5 MG PO TABS: ORAL_TABLET | ORAL | 1 refills | Status: DC

## 2023-01-12 ENCOUNTER — Telehealth: Payer: Self-pay | Admitting: Cardiology

## 2023-01-12 NOTE — Telephone Encounter (Signed)
   Pre-operative Risk Assessment    Patient Name: Larry Odonnell  DOB: [birth year] MRN: [id number]      Request for Surgical Clearance    Procedure:  left total hip replacement  Date of Surgery:  Clearance    Pending                              Surgeon:  Dr  Dorna Leitz Surgeon's Group or Practice Name:   Phone number:  [phone] Fax number:  [phone]   Type of Clearance Requested:     Type of Anesthesia:  Spinal   Additional requests/questions:    Signed, Glyn Ade   01/12/2023, 11:17 AM

## 2023-01-13 ENCOUNTER — Encounter: Payer: Self-pay | Admitting: Cardiology

## 2023-01-13 ENCOUNTER — Emergency Department (HOSPITAL_COMMUNITY)
Admission: EM | Admit: 2023-01-13 | Discharge: 2023-01-14 | Disposition: A | Discharge status: Home / Self Care | Payer: Medicare Other

## 2023-01-13 NOTE — Telephone Encounter (Signed)
   Name: Larry Odonnell  DOB: Apr 30, 1946  MRN: [id number]  Primary Cardiologist: Freada Bergeron, MD   Preoperative team, please contact this patient and set up a phone call appointment for further preoperative risk assessment. Please obtain consent and complete medication review. Thank you for your help.  I confirm that guidance regarding antiplatelet and oral anticoagulation therapy has been completed and, if necessary, noted below.  Pharmacy has provided recommendations for anticoagulation hold.   Deberah Pelton, NP 01/13/2023, 12:44 PM [place]

## 2023-01-13 NOTE — Telephone Encounter (Signed)
Spoke with patient who already has a scheduled appointment with Nicholes Rough, PA-C to address the pre-op clearance then.

## 2023-01-13 NOTE — Telephone Encounter (Signed)
I spoke with patient.  He reports he checks BP on occasion. Checked last night and his heart rate got as high as 137. Stayed elevated for a couple hours and then dropped to 80-90 range.  Today his heart rate has been running 120-130 off and on.  Will go to 80's when he sits quietly.  BP readings below are from today.  He is unable to tell me how long his heart rate is staying elevated.  He does have blurred vision which started today.  He checked vital signs while on the phone and his BP is 154/83 and heart rate is 144.  He is not having any chest pain.  I advised patient he should be evaluated in ED due to his symptoms.  He is aware he should not drive.

## 2023-01-13 NOTE — Telephone Encounter (Signed)
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    Procedure: left total hip replacement  Date of procedure: TBD   CHA2DS2-VASc Score = 4   This indicates a 4.8% annual risk of stroke. The patient's score is based upon: CHF History: 0 HTN History: 1 Diabetes History: 0 Stroke History: 0 Vascular Disease History: 1 Age Score: 2 Gender Score: 0      CrCl 72 ml/min Platelet count 188  Per office protocol, patient can hold warfarin for 5 days prior to procedure.    Patient WILL need bridging with Lovenox (enoxaparin) around procedure. This will be coordinated by coumadin clinic. Patient will need to let coumadin clinic know as soon as procedure scheduled so a bridge can be coordinated.   **This guidance is not considered finalized until pre-operative APP has relayed final recommendations.**

## 2023-01-13 NOTE — Telephone Encounter (Signed)
Call placed to patient to get more information. Left message to call office

## 2023-01-13 NOTE — ED Notes (Signed)
Pt left hospital.

## 2023-01-16 ENCOUNTER — Telehealth: Payer: Self-pay | Admitting: General Practice

## 2023-01-16 NOTE — Progress Notes (Addendum)
Office Visit    Patient Name: Larry Odonnell Date of Encounter: 01/16/2023  Primary Care Provider:  Samuel Bouche, NP Primary Cardiologist:  Freada Bergeron, MD Primary Electrophysiologist: Vickie Epley, MD  Chief Complaint    Larry Odonnell is a 77 y.o. male with PMH of atrial fibrillation, DVT, HTN, pulmonary embolism who presents today for complaint of elevated heart rate.  Past Medical History    Past Medical History:  Diagnosis Date   A-fib [facility])    Allergy    Latex   Anxiety 2006   Cancer [facility]) 2012   Prostate   Cataract 2017   Surgery both eyes   Depression 2006   DVT (deep venous thrombosis) (HCC)    History of colon polyps    1 adenoma: 6- 2018   Hypertension    Kidney stones 01/08/2020   Mixed hyperlipidemia 08/09/2021   Peripheral neuropathy 08/02/2012   Primary osteoarthritis of left hip 11/23/2021   Pulmonary embolism ([place])    Pulmonary emphysema ([place]) 11/07/2022   Tobacco dependence 07/02/2019   Past Surgical History:  Procedure Laterality Date   CARDIOVERSION N/A 07/21/2022   Procedure: CARDIOVERSION;  Surgeon: Josue Hector, MD;  Location: [facility] ENDOSCOPY;  Service: Cardiovascular;  Laterality: N/A;   EYE SURGERY  2017   Cataracts   PROSTATECTOMY     TONSILLECTOMY      Allergies  Allergies  Allergen Reactions   Atorvastatin Other (See Comments)    Pt reports causes leg pains and leg heaviness   Oxycodone Other (See Comments)    Causes extreme dizziness and falling down   Latex Rash    History of Present Illness    Larry Odonnell  is a 77 year old male with the above mention past medical history who presents today for complaint of elevated heart rate and atrial fibrillation.  [name] was initially seen on 02/2021 by Dr. Johney Frame for management of atrial fibrillation and DVT/PE.  He was diagnosed with atrial fibrillation 08/2020 and was started on metoprolol and amiodarone by [facility] cardiologist.  He underwent DCCV and  2D echo was completed 02/2022 showing normal EF and no significant valve disease.  He is currently on Coumadin for anticoagulation.  He was seen by Dr. Johney Frame on 06/2022 and was in atrial fibrillation with RVR but asymptomatic.  He was set up for DCCV and referred to EP for consideration of ablation.  He underwent successful DCCV on 07/21/2022 and was seen by Dr. Quentin Ore on 08/19/2022.  He reported wanting to avoid medication if at all possible and radiofrequency ablation and EP study was discussed versus watchful waiting.  Patient elected to complete watchful waiting at this time.  He was deemed a good candidate for atrial fibrillation.  He was doing well until he contacted after-hours nurse line 01/13/2023 with complaint of elevated heart rate and labile blood pressure.  [name] presents today for complaint of atrial fibrillation and surgical clearance for upcoming hip replacement procedure.  Since last being seen in the office patient reports that he is noticed elevated blood pressures and heart rate.  Patient's blood pressure today was well-controlled at 138/62 and heart rate was elevated at 137 bpm.  EKG was obtained and revealed that patient was in AF with RVR.  He is currently asymptomatic and reports that he does not feel any difference when he is in A-fib or sinus rhythm.  During our visit we discussed the pathophysiology of atrial fibrillation and we reviewed  the need to monitor his heart rate moving forward.  He is able to maintain 4 METS of physical activity and patient will be fine to pursue surgical clearance once he has his heart rate under better control.  Patient denies chest pain, palpitations, dyspnea, PND, orthopnea, nausea, vomiting, dizziness, syncope, edema, weight gain, or early satiety.   Home Medications    Current Outpatient Medications  Medication Sig Dispense Refill   cholecalciferol (VITAMIN D) 25 MCG (1000 UNIT) tablet Take 1,000 Units by mouth daily.     clonazePAM  (KLONOPIN) 0.5 MG tablet Take 1 tablet (0.5 mg total) by mouth daily as needed for anxiety. 30 tablet 2   erythromycin ophthalmic ointment Place 1 Application into the right eye 3 (three) times daily. X 5-7 days 3.5 g 0   lisinopril (ZESTRIL) 10 MG tablet Take 1 tablet (10 mg total) by mouth daily. 90 tablet 3   Melatonin 10 MG TABS Take 10 mg by mouth at bedtime.     metoprolol succinate (TOPROL XL) 25 MG 24 hr tablet Take 1.5 tablets (37.5 mg total) by mouth daily. 135 tablet 1   Multiple Vitamin (MULTI-VITAMIN) tablet Take 1 tablet by mouth daily.     Omega-3 1000 MG CAPS Take 1,000 mg by mouth daily.     rosuvastatin (CRESTOR) 10 MG tablet Take 1 tablet (10 mg total) by mouth daily. 90 tablet 3   warfarin (COUMADIN) 1 MG tablet Take 2 tablets by mouth once daily along with '5mg'$  tablet or as directed by Coumadin clinic 180 tablet 1   warfarin (COUMADIN) 5 MG tablet Take 5 mg tablet by mouth once daily along with '1mg'$  tablet or as directed by the coumadin clinic 100 tablet 1   No current facility-administered medications for this visit.     Review of Systems  Please see the history of present illness.    (+) Tachycardia  All other systems reviewed and are otherwise negative except as noted above.  Physical Exam    Wt Readings from Last 3 Encounters:  11/29/22 192 lb 1.9 oz (87.1 kg)  10/24/22 190 lb (86.2 kg)  10/19/22 197 lb (89.4 kg)   PP:JKDTO were no vitals filed for this visit.,There is no height or weight on file to calculate BMI.  Constitutional:      Appearance: Healthy appearance. Not in distress.  Neck:     Vascular: JVD normal.  Pulmonary:     Effort: Pulmonary effort is normal.     Breath sounds: No wheezing. No rales. Diminished in the bases Cardiovascular:     Irregularly irregular normal S1. Normal S2.      Murmurs: There is no murmur.  Edema:    Peripheral edema absent.  Abdominal:     Palpations: Abdomen is soft non tender. There is no hepatomegaly.   Skin:    General: Skin is warm and dry.  Neurological:     General: No focal deficit present.     Mental Status: Alert and oriented to person, place and time.     Cranial Nerves: Cranial nerves are intact.  EKG/LABS/Other Studies Reviewed    ECG personally reviewed by me today -atrial fibrillation with RVR and rate of 137 bpm.  Risk Assessment/Calculations:    CHA2DS2-VASc Score = 4   This indicates a 4.8% annual risk of stroke. The patient's score is based upon: CHF History: 0 HTN History: 1 Diabetes History: 0 Stroke History: 0 Vascular Disease History: 1 Age Score: 2 Gender Score: 0  Lab Results  Component Value Date   WBC 12.9 (H) 07/20/2022   HGB 16.3 07/20/2022   HCT 47.3 07/20/2022   MCV 92 07/20/2022   PLT 188 07/20/2022   Lab Results  Component Value Date   CREATININE 1.07 07/20/2022   BUN 17 07/20/2022   NA 136 07/20/2022   K 4.1 07/20/2022   CL 103 07/20/2022   CO2 23 07/20/2022   Lab Results  Component Value Date   ALT 22 11/15/2021   AST 22 11/15/2021   ALKPHOS 65 04/20/2021   BILITOT 0.5 11/15/2021   Lab Results  Component Value Date   CHOL 166 06/01/2021   HDL 46 06/01/2021   LDLCALC 106 (H) 06/01/2021   TRIG 76 06/01/2021   CHOLHDL 3.6 06/01/2021    No results found for: "HGBA1C"  Assessment & Plan    1.  Persistent atrial fibrillation: -Patient reports elevated heart rate over previous few days running from 80-140's -Today patient is in atrial fibrillation with RVR and a rate of 137 bpm.  He is currently asymptomatic and has no awareness of his atrial fibrillation. -He previously was seen by Dr. Quentin Ore for consultation of atrial fibrillation ablation.  Today patient reports that he would like to proceed with atrial fibrillation ablation at this time.  I will arrange a follow-up visit with him to discuss further with Dr. Quentin Ore or his APP. -We will increase Toprol-XL to 50 mg daily with PRN dose of 25 mg and we will plan to add  Cardizem 30 mg twice daily if B/P is not too soft with beta blocker -Patient was encouraged to purchase Kardia mobile monitor to evaluate rate following increase in metoprolol.  2.  Essential hypertension -Patient blood pressure today was well-controlled at 112/62 -Continue lisinopril 10 mg daily and Toprol as noted above.  3.  Chronic PE/DVT: -Patient is currently followed by hematology -Continue daily Coumadin dose per Coumadin clinic.  4.  Hyperlipidemia: -Patient's last LDL-cholesterol was 106 in 05/2021 -We will plan to recheck LFTs and lipids at patient's follow-up visit. -Continue Crestor 10 mg daily  5.  Bilateral leg pain: -Patient denies any recurrent leg pain -Continue current treatment plan and physical activity.  6. Preoperative clearance: -Patient currently with AF with RVR today and clearance evaluation was unable to be complete due to current state of health.  We will readdress once patient has rate better controlled and clearance note will be updated prior to providing clearance.  Disposition: Follow-up with Freada Bergeron, MD or APP in 2 months    Medication Adjustments/Labs and Tests Ordered: Current medicines are reviewed at length with the patient today.  Concerns regarding medicines are outlined above.   Signed, Mable Fill, Marissa Nestle, NP 01/16/2023, 7:30 PM [facility]  Note:  This document was prepared using Dragon voice recognition software and may include unintentional dictation errors.

## 2023-01-16 NOTE — Telephone Encounter (Signed)
Transition Care Management Follow-up Telephone Call Date of discharge and from where: 01/14/23 from [facility] How have you been since you were released from the hospital? Doing ok; still feel like he is in Afib. Denies any chest pain, difficulty breathing or shortness of breath. Has been in touch with the cardiologist and has an appt on 2/9 however they are working on moving appointment sooner.  Any questions or concerns? No  Items Reviewed: Did the pt receive and understand the discharge instructions provided? Yes  Medications obtained and verified? No  Other? No  Any new allergies since your discharge? No  Dietary orders reviewed? Yes Do you have support at home? Yes   Home Care and Equipment/Supplies: Were home health services ordered? no  Functional Questionnaire: (I = Independent and D = Dependent) ADLs: I  Bathing/Dressing- I  Meal Prep- I  Eating- I  Maintaining continence- I  Transferring/Ambulation- I  Managing Meds- I  Follow up appointments reviewed:  PCP Hospital f/u appt confirmed? Yes; scheduled for 01/17/23 at [facility] f/u appt confirmed? Yes  Scheduled to see the cardiologist on 02/03/23 and is working to see if that appointment can be moved sooner. He is waiting for them to call him back. Are transportation arrangements needed? No  If their condition worsens, is the pt aware to call PCP or go to the Emergency Dept.? Yes Was the patient provided with contact information for the PCP's office or ED? Yes Was to pt encouraged to call back with questions or concerns? Yes

## 2023-01-16 NOTE — Telephone Encounter (Signed)
Called pt in regards HR.  Pt reports HR runs 80- low 140's.  Advised pt HR in 140's needs to be evaluated by ED.   Pt is insistent on not going to the ED.  Would like to be seen in office sooner than 02/03/23.  Rescheduled pt to 01/17/23 with Jaquelyn Bitter, NP at 2:45 pm. Advised pt to report to the ED if symptoms worsen.   Advised HR this high is not good on the heart.  Pt again expressed will not go to the ED.  Will come into office tomorrow 01/17/23.  No further concerns voiced.

## 2023-01-17 ENCOUNTER — Ambulatory Visit: Payer: Medicare Other | Attending: Nurse Practitioner | Admitting: Nurse Practitioner

## 2023-01-17 ENCOUNTER — Encounter: Payer: Self-pay | Admitting: Medical-Surgical

## 2023-01-17 ENCOUNTER — Ambulatory Visit: Payer: Medicare Other | Admitting: Medical-Surgical

## 2023-01-17 ENCOUNTER — Encounter: Payer: Self-pay | Admitting: Nurse Practitioner

## 2023-01-17 VITALS — BP 112/62 | HR 137 | Ht 73.0 in | Wt 193.8 lb

## 2023-01-17 DIAGNOSIS — E785 Hyperlipidemia, unspecified: Secondary | ICD-10-CM | POA: Diagnosis not present

## 2023-01-17 DIAGNOSIS — I825Z9 Chronic embolism and thrombosis of unspecified deep veins of unspecified distal lower extremity: Secondary | ICD-10-CM | POA: Diagnosis not present

## 2023-01-17 DIAGNOSIS — I4891 Unspecified atrial fibrillation: Secondary | ICD-10-CM

## 2023-01-17 DIAGNOSIS — Z01812 Encounter for preprocedural laboratory examination: Secondary | ICD-10-CM

## 2023-01-17 DIAGNOSIS — I4819 Other persistent atrial fibrillation: Secondary | ICD-10-CM

## 2023-01-17 DIAGNOSIS — I10 Essential (primary) hypertension: Secondary | ICD-10-CM | POA: Diagnosis not present

## 2023-01-17 MED ORDER — METOPROLOL SUCCINATE ER 50 MG PO TB24: 50.0000 mg | ORAL_TABLET | Freq: Every day | ORAL | 2 refills | Status: DC

## 2023-01-17 NOTE — Patient Instructions (Signed)
Medication Instructions:  INCREASE Toprol to '50mg'$  Take 1 tablet once a day  *If you need a refill on your cardiac medications before your next appointment, please call your pharmacy*   Lab Work: NONE ORDERED If you have labs (blood work) drawn today and your tests are completely normal, you will receive your results only by: [place] (if you have MyChart) OR A paper copy in the mail If you have any lab test that is abnormal or we need to change your treatment, we will call you to review the results.   Testing/Procedures: NONE ORDERED Get the Kardia APP   Follow-Up: At [facility], you and your health needs are our priority.  As part of our continuing mission to provide you with exceptional heart care, we have created designated Provider Care Teams.  These Care Teams include your primary Cardiologist (physician) and Advanced Practice Providers (APPs -  Physician Assistants and Nurse Practitioners) who all work together to provide you with the care you need, when you need it.  We recommend signing up for the patient portal called "MyChart".  Sign up information is provided on this After Visit Summary.  MyChart is used to connect with patients for Virtual Visits (Telemedicine).  Patients are able to view lab/test results, encounter notes, upcoming appointments, etc.  Non-urgent messages can be sent to your provider as well.   To learn more about what you can do with MyChart, go to [email or web address].    Your next appointment:   FIRST AVAILABLE  Provider:   Lars Mage, MD OR EP APP   Other Instructions CHECK YOUR HEART RATE DAILY AND CONTACT THE OFFICE IN A WEEK WITH THE READINGS.

## 2023-01-18 NOTE — Telephone Encounter (Signed)
I contacted patient and he wanted to cancel the appointment and will call back to reschedule when he is out of afib.   I have canceled his appointment for 01/29.

## 2023-01-19 ENCOUNTER — Telehealth: Payer: Self-pay | Admitting: Nurse Practitioner

## 2023-01-19 NOTE — Telephone Encounter (Signed)
[name] was contacted this morning regarding his atrial fibrillation and response to therapies ordered at follow-up visit on 01/17/2023.  He reports that his blood pressures have ranged from the low 702O to 378H systolically with improvement of his heart rate to the 90s.  He does note some unsteadiness at times with standing but denies any presyncope or dizziness.  I advised him to make sure to stand slowly from a sitting position and to write down any further concerns and bring to his follow-up tomorrow with Dr. Quentin Ore.  He was very grateful for the call and thanked me for my time.  Ambrose Pancoast, NP

## 2023-01-20 ENCOUNTER — Encounter: Payer: Self-pay | Admitting: Cardiology

## 2023-01-20 ENCOUNTER — Ambulatory Visit: Payer: Medicare Other | Attending: Cardiology | Admitting: Cardiology

## 2023-01-20 VITALS — BP 126/78 | HR 151 | Ht 73.0 in | Wt 194.0 lb

## 2023-01-20 DIAGNOSIS — I10 Essential (primary) hypertension: Secondary | ICD-10-CM | POA: Diagnosis not present

## 2023-01-20 DIAGNOSIS — I4819 Other persistent atrial fibrillation: Secondary | ICD-10-CM | POA: Diagnosis not present

## 2023-01-20 MED ORDER — METOPROLOL SUCCINATE ER 50 MG PO TB24: 50.0000 mg | ORAL_TABLET | Freq: Two times a day (BID) | ORAL | 3 refills | Status: DC

## 2023-01-20 MED ORDER — AMIODARONE HCL 200 MG PO TABS: 200.0000 mg | ORAL_TABLET | Freq: Every day | ORAL | 3 refills | Status: DC

## 2023-01-20 NOTE — Patient Instructions (Addendum)
Medication Instructions:  Your physician has recommended you make the following change in your medication:  1) INCREASE Toprol (metoprolol succinate) to 50 mg twice daily  2) START taking amiodarone 400 mg twice daily for 5 days, then 400 mg once daily for 5 days, then 200 mg once daily thereafter  *If you need a refill on your cardiac medications before your next appointment, please call your pharmacy*   Lab Work: CMET, TSH, T4, CBC prior to cardioversion   If you have labs (blood work) drawn today and your tests are completely normal, you will receive your results only by: [place] (if you have MyChart) OR A paper copy in the mail If you have any lab test that is abnormal or we need to change your treatment, we will call you to review the results.   Testing/Procedures: Your physician has recommended that you have a Cardioversion (DCCV). Electrical Cardioversion uses a jolt of electricity to your heart either through paddles or wired patches attached to your chest. This is a controlled, usually prescheduled, procedure. Defibrillation is done under light anesthesia in the hospital, and you usually go home the day of the procedure. This is done to get your heart back into a normal rhythm. You are not awake for the procedure. Please see the instruction sheet given to you today.   Your physician has requested that you have cardiac CT. Cardiac computed tomography (CT) is a painless test that uses an x-ray machine to take clear, detailed pictures of your heart. For further information please visit [email or web address].[email or web address]. Please follow instruction sheet as given. We will call you to schedule.  Your physician has recommended that you have an ablation. Catheter ablation is a medical procedure used to treat some cardiac arrhythmias (irregular heartbeats). During catheter ablation, a long, thin, flexible tube is put into a blood vessel in your groin (upper thigh), or neck. This tube is called an  ablation catheter. It is then guided to your heart through the blood vessel. Radio frequency waves destroy small areas of heart tissue where abnormal heartbeats may cause an arrhythmia to start. Please see the instruction sheet given to you today. Your ablation is scheduled for May 13th at 7:30am. Please arrive at [facility] at 5:30am. We will call you for further instructions.   Follow-Up: At [facility], you and your health needs are our priority.  As part of our continuing mission to provide you with exceptional heart care, we have created designated Provider Care Teams.  These Care Teams include your primary Cardiologist (physician) and Advanced Practice Providers (APPs -  Physician Assistants and Nurse Practitioners) who all work together to provide you with the care you need, when you need it.  Your next appointment:   We will call you to arrange your follow up appointments.   Other Instructions     You are scheduled for a Cardioversion on Thursday, February 29 with Dr. Johnsie Cancel.  Please arrive at the [facility] (Main Entrance A) at [facility]: [place], [place] [address] at 8:00AM.   Nothing to eat or drink after midnight except a sip of water with medications (see medication instructions below)  MEDICATION INSTRUCTIONS: Continue taking your anticoagulant (blood thinner): Warfarin (Coumadin).  You will need to continue this after your procedure until you are told by your provider that it is safe to stop.    LABS: Monday, February 26th Come to the lab at [facility] at [address]  between the hours of 8:00 am and 4:30 pm. You do NOT have to be fasting.  FYI:  For your safety, and to allow [place] to monitor your vital signs accurately during the surgery/procedure we request: If you have artificial nails, gel coating, SNS etc, please have those removed prior to your surgery/procedure. Not having the nail coverings /polish removed  may result in cancellation or delay of your surgery/procedure.  You must have a responsible person to drive you home and stay in the waiting area during your procedure. Failure to do so could result in cancellation.  Bring your insurance cards.  *Special Note: Every effort is made to have your procedure done on time. Occasionally there are emergencies that occur at the hospital that may cause delays. Please be patient if a delay does occur.

## 2023-01-20 NOTE — Progress Notes (Signed)
Electrophysiology Office Follow up Visit Note:    Date:  01/20/2023   ID:  Larry Odonnell, DOB [birth year], MRN [id number]  PCP:  Samuel Bouche, NP  [facility] Cardiologist:  Freada Bergeron, MD  [facility] Electrophysiologist:  Vickie Epley, MD    Interval History:    Larry Odonnell is a 77 y.o. male who presents for a follow up visit. They were last seen in clinic August 19, 2022.  I saw him in the past for atrial fibrillation and we discussed treatment strategies including antiarrhythmic drugs and catheter ablation.  We planned to touch base about 6 months after that appointment.  He recently met with Ambrose Pancoast January 17, 2023.  At that appointment the patient expressed a desire to proceed with catheter ablation.  At the appointment with Jaquelyn Bitter, he was in atrial fibrillation with a rapid ventricular rate.  He reports fatigue while in atrial fibrillation.  Today, his heart rate is elevated in the 150s. Since 01/12/23 when he first noted his recent rapid heart rates, he complains of feeling fatigued, lethargic, occasional blurred vision, and brain fog ("not alert"). He denies being in any kind of pain. He has not needed to take an extra dose of metoprolol; he did not want to change anything prior to today's appointment.  Earlier this month he began walking for exercise; he is trying to get into a routine but is struggling due to his significant fatigue. 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  Reportedly his INR has fluctuated, most recently within the goal range 2 to 3. Currently on warfarin since 04/2019 when he was found to have a PE.  In his family, his mother also had atrial fibrillation.  He did not tolerate amiodarone previously. Also had developed severe leg pains and his atorvastatin was discontinued.  He denies any chest pain, shortness of breath, or peripheral edema. No lightheadedness, headaches, syncope, orthopnea, or  PND.      Past Medical History:  Diagnosis Date   A-fib [facility])    Allergy    Latex   Anxiety 2006   Cancer [facility]) 2012   Prostate   Cataract 2017   Surgery both eyes   Depression 2006   DVT (deep venous thrombosis) (HCC)    History of colon polyps    1 adenoma: 6- 2018   Hypertension    Kidney stones 01/08/2020   Mixed hyperlipidemia 08/09/2021   Peripheral neuropathy 08/02/2012   Primary osteoarthritis of left hip 11/23/2021   Pulmonary embolism ([place])    Pulmonary emphysema ([place]) 11/07/2022   Tobacco dependence 07/02/2019    Past Surgical History:  Procedure Laterality Date   CARDIOVERSION N/A 07/21/2022   Procedure: CARDIOVERSION;  Surgeon: Josue Hector, MD;  Location: [facility] ENDOSCOPY;  Service: Cardiovascular;  Laterality: N/A;   EYE SURGERY  2017   Cataracts   PROSTATECTOMY     TONSILLECTOMY      Current Medications: Current Meds  Medication Sig   amiodarone (PACERONE) 200 MG tablet Take 1 tablet (200 mg total) by mouth daily. Take 2 tablets (400 mg) twice daily for 5 days, take 2 tablets (400 mg) once daily for 5 days, then take 200 mg daily thereafter   cholecalciferol (VITAMIN D) 25 MCG (1000 UNIT) tablet Take 1,000 Units by mouth daily.   clonazePAM (KLONOPIN) 0.5 MG tablet Take 1 tablet (0.5 mg total) by mouth daily as needed for anxiety.   erythromycin ophthalmic ointment  Place 1 Application into the right eye 3 (three) times daily. X 5-7 days   lisinopril (ZESTRIL) 10 MG tablet Take 1 tablet (10 mg total) by mouth daily.   Melatonin 10 MG TABS Take 10 mg by mouth at bedtime.   metoprolol succinate (TOPROL-XL) 50 MG 24 hr tablet Take 1 tablet (50 mg total) by mouth 2 (two) times daily. Take with or immediately following a meal.   Multiple Vitamin (MULTI-VITAMIN) tablet Take 1 tablet by mouth daily.   Omega-3 1000 MG CAPS Take 1,000 mg by mouth daily.   rosuvastatin (CRESTOR) 10 MG tablet Take 1 tablet (10 mg total) by mouth daily.   warfarin (COUMADIN) 1 MG  tablet Take 2 tablets by mouth once daily along with '5mg'$  tablet or as directed by Coumadin clinic   warfarin (COUMADIN) 5 MG tablet Take 5 mg tablet by mouth once daily along with '1mg'$  tablet or as directed by the coumadin clinic (Patient taking differently: Take 5 mg by mouth daily at 6 (six) AM. Pt is presently taken '7mg'$  except for Mon. Wed.and Friday is 5 mg.)   [DISCONTINUED] metoprolol succinate (TOPROL-XL) 50 MG 24 hr tablet Take 1 tablet (50 mg total) by mouth daily. CAN TAKE AN ADDITIONAL HALF TABLET FOR HEART RATE GREATER THAN 100     Allergies:   Atorvastatin, Oxycodone, and Latex   Social History   Socioeconomic History   Marital status: Married    Spouse name: Colon Branch   Number of children: 1   Years of education: 13   Highest education level: Some college, no degree  Occupational History   Occupation: Retired  Tobacco Use   Smoking status: Every Day    Packs/day: 1.25    Years: 47.00    Total pack years: 58.75    Types: Cigarettes   Smokeless tobacco: Never   Tobacco comments:    Quit 1990-2015  Vaping Use   Vaping Use: Never used  Substance and Sexual Activity   Alcohol use: Not Currently    Comment: No alcohol since 1995   Drug use: Never   Sexual activity: Not Currently    Birth control/protection: None  Other Topics Concern   Not on file  Social History Narrative   Lives with wife. He enjoys yardwork.   Social Determinants of Health   Financial Resource Strain: Low Risk  (08/19/2022)   Overall Financial Resource Strain (CARDIA)    Difficulty of Paying Living Expenses: Not hard at all  Food Insecurity: No Food Insecurity (08/19/2022)   Hunger Vital Sign    Worried About Running Out of Food in the Last Year: Never true    Ran Out of Food in the Last Year: Never true  Transportation Needs: No Transportation Needs (08/19/2022)   PRAPARE - Hydrologist (Medical): No    Lack of Transportation (Non-Medical): No  Physical  Activity: Unknown (08/19/2022)   Exercise Vital Sign    Days of Exercise per Week: 4 days    Minutes of Exercise per Session: Patient refused  Stress: No Stress Concern Present (08/19/2022)   [place]    Feeling of Stress : Only a little  Social Connections: Moderately Integrated (08/22/2022)   Social Connection and Isolation Panel [NHANES]    Frequency of Communication with Friends and Family: Once a week    Frequency of Social Gatherings with Friends and Family: Once a week    Attends Religious Services:  More than 4 times per year    Active Member of Clubs or Organizations: Yes    Attends Archivist Meetings: Patient refused    Marital Status: Married     Family History: The patient's family history includes Alcohol abuse in his father; Anxiety disorder in his brother, daughter, and mother; Cancer in his mother; Depression in his brother and daughter; Early death in his father; Stroke in his mother. There is no history of Esophageal cancer.  ROS:   Please see the history of present illness.    (+) Fatigue/Malaise (+) Blurred vision (+) Brain fog All other systems reviewed and are negative.  EKGs/Labs/Other Studies Reviewed:    The following studies were reviewed today:  Recent Labs: 07/20/2022: BUN 17; Creatinine, Ser 1.07; Hemoglobin 16.3; Platelets 188; Potassium 4.1; Sodium 136   Recent Lipid Panel    Component Value Date/Time   CHOL 166 06/01/2021 0915   TRIG 76 06/01/2021 0915   HDL 46 06/01/2021 0915   CHOLHDL 3.6 06/01/2021 0915   LDLCALC 106 (H) 06/01/2021 0915    Physical Exam:    VS:  BP 126/78   Pulse (!) 151   Ht '6\' 1"'$  (1.854 m)   Wt 194 lb (88 kg)   SpO2 95%   BMI 25.60 kg/m     Wt Readings from Last 3 Encounters:  01/20/23 194 lb (88 kg)  01/17/23 193 lb 12.8 oz (87.9 kg)  11/29/22 192 lb 1.9 oz (87.1 kg)     GEN: Well nourished, well developed in no acute  distress CARDIAC: Irregularly irregular, Tachycardic, no murmurs, rubs, gallops RESPIRATORY:  Clear to auscultation without rales, wheezing or rhonchi        ASSESSMENT:    1. Persistent atrial fibrillation ([place])   2. Primary hypertension    PLAN:    In order of problems listed above:  #Persistent atrial fibrillation Recently has transition to persistent atrial fibrillation.  He has had difficult to control ventricular rates.  We discussed treatment options again during today's clinic appointment including rate control, antiarrhythmic drugs and catheter ablation.  He would like to proceed with catheter ablation which I think is reasonable given his difficult to control ventricular rates while in atrial fibrillation and symptoms of fatigue while out of rhythm.  He is out of rhythm and conducting fast today. In an effort to give him some immediate relief, I will start him back on amiodarone. Plan for '400mg'$  PO BID x 5 days followed by '400mg'$  PO daily x 5 days followed by '200mg'$  PO daily thereafter. I will also increase his metoprolol to '50mg'$  PO BID today. Plan for DCCV in about 3 weeks. I discussed DCCV in detail including risks and he wishes to proceed.   Discussed treatment options today for their AF including antiarrhythmic drug therapy and ablation. Discussed risks, recovery and likelihood of success. Discussed potential need for repeat ablation procedures and antiarrhythmic drugs after an initial ablation. They wish to proceed with scheduling.  Risk, benefits, and alternatives to EP study and radiofrequency ablation for afib were also discussed in detail today. These risks include but are not limited to stroke, bleeding, vascular damage, tamponade, perforation, damage to the esophagus, lungs, and other structures, pulmonary vein stenosis, worsening renal function, and death. The patient understands these risk and wishes to proceed.  We will therefore proceed with catheter ablation at the next  available time.  Carto, ICE, anesthesia are requested for the procedure.  Will also obtain CT PV protocol  prior to the procedure to exclude LAA thrombus and further evaluate atrial anatomy.   #Hypertension At goal today.  Recommend checking blood pressures 1-2 times per week at home and recording the values.  Recommend bringing these recordings to the primary care physician.  Medication Adjustments/Labs and Tests Ordered: Current medicines are reviewed at length with the patient today.  Concerns regarding medicines are outlined above.   Orders Placed This Encounter  Procedures   CT CARDIAC MORPH/PULM VEIN W/CM&W/O CA SCORE   CBC with Differential/Platelet   Comprehensive metabolic panel   T4, free   TSH   CBC with Differential/Platelet   Basic metabolic panel   EKG [id number]   Meds ordered this encounter  Medications   metoprolol succinate (TOPROL-XL) 50 MG 24 hr tablet    Sig: Take 1 tablet (50 mg total) by mouth 2 (two) times daily. Take with or immediately following a meal.    Dispense:  180 tablet    Refill:  3   amiodarone (PACERONE) 200 MG tablet    Sig: Take 1 tablet (200 mg total) by mouth daily. Take 2 tablets (400 mg) twice daily for 5 days, take 2 tablets (400 mg) once daily for 5 days, then take 200 mg daily thereafter    Dispense:  90 tablet    Refill:  3   I,Mathew Stumpf,acting as a scribe for Vickie Epley, MD.,have documented all relevant documentation on the behalf of Vickie Epley, MD,as directed by  Vickie Epley, MD while in the presence of Vickie Epley, MD.  I, Vickie Epley, MD, have reviewed all documentation for this visit. The documentation on 01/20/23 for the exam, diagnosis, procedures, and orders are all accurate and complete.   Signed, Lars Mage, MD, [facility], [facility] 01/20/2023 10:09 PM    Electrophysiology  [facility]

## 2023-01-20 NOTE — Progress Notes (Deleted)
Electrophysiology Office Follow up Visit Note:    Date:  01/20/2023   ID:  Larry Odonnell, DOB [birth year], MRN [id number]  PCP:  Samuel Bouche, NP  [facility] Cardiologist:  Freada Bergeron, MD  [facility] Electrophysiologist:  Vickie Epley, MD    Interval History:    Larry Odonnell is a 77 y.o. male who presents for a follow up visit. They were last seen in clinic August 19, 2022.  I saw him in the past for atrial fibrillation and we discussed treatment strategies including antiarrhythmic drugs and catheter ablation.  We planned to touch base about 6 months after that appointment.  He recently met with Ambrose Pancoast January 17, 2023.  At that appointment the patient expressed a desire to proceed with catheter ablation.  At the appointment with Jaquelyn Bitter, he was in atrial fibrillation with a rapid ventricular rate.  He reports fatigue while in atrial fibrillation.***     Past Medical History:  Diagnosis Date   A-fib [facility])    Allergy    Latex   Anxiety 2006   Cancer [facility]) 2012   Prostate   Cataract 2017   Surgery both eyes   Depression 2006   DVT (deep venous thrombosis) (HCC)    History of colon polyps    1 adenoma: 6- 2018   Hypertension    Kidney stones 01/08/2020   Mixed hyperlipidemia 08/09/2021   Peripheral neuropathy 08/02/2012   Primary osteoarthritis of left hip 11/23/2021   Pulmonary embolism ([place])    Pulmonary emphysema ([place]) 11/07/2022   Tobacco dependence 07/02/2019    Past Surgical History:  Procedure Laterality Date   CARDIOVERSION N/A 07/21/2022   Procedure: CARDIOVERSION;  Surgeon: Josue Hector, MD;  Location: [facility] ENDOSCOPY;  Service: Cardiovascular;  Laterality: N/A;   EYE SURGERY  2017   Cataracts   PROSTATECTOMY     TONSILLECTOMY      Current Medications: No outpatient medications have been marked as taking for the 01/20/23 encounter (Appointment) with Vickie Epley, MD.     Allergies:   Atorvastatin, Oxycodone, and Latex    Social History   Socioeconomic History   Marital status: Married    Spouse name: Colon Branch   Number of children: 1   Years of education: 13   Highest education level: Some college, no degree  Occupational History   Occupation: Retired  Tobacco Use   Smoking status: Every Day    Packs/day: 1.25    Years: 47.00    Total pack years: 58.75    Types: Cigarettes   Smokeless tobacco: Never   Tobacco comments:    Quit 1990-2015  Vaping Use   Vaping Use: Never used  Substance and Sexual Activity   Alcohol use: Not Currently    Comment: No alcohol since 1995   Drug use: Never   Sexual activity: Not Currently    Birth control/protection: None  Other Topics Concern   Not on file  Social History Narrative   Lives with wife. He enjoys yardwork.   Social Determinants of Health   Financial Resource Strain: Low Risk  (08/19/2022)   Overall Financial Resource Strain (CARDIA)    Difficulty of Paying Living Expenses: Not hard at all  Food Insecurity: No Food Insecurity (08/19/2022)   Hunger Vital Sign    Worried About Running Out of Food in the Last Year: Never true    Ran Out of Food in the Last Year: Never true  Transportation Needs: No Transportation  Needs (08/19/2022)   PRAPARE - Hydrologist (Medical): No    Lack of Transportation (Non-Medical): No  Physical Activity: Unknown (08/19/2022)   Exercise Vital Sign    Days of Exercise per Week: 4 days    Minutes of Exercise per Session: Patient refused  Stress: No Stress Concern Present (08/19/2022)   [place]    Feeling of Stress : Only a little  Social Connections: Moderately Integrated (08/22/2022)   Social Connection and Isolation Panel [NHANES]    Frequency of Communication with Friends and Family: Once a week    Frequency of Social Gatherings with Friends and Family: Once a week    Attends Religious Services: More than 4  times per year    Active Member of [employer] or Organizations: Yes    Attends Archivist Meetings: Patient refused    Marital Status: Married     Family History: The patient's family history includes Alcohol abuse in his father; Anxiety disorder in his brother, daughter, and mother; Cancer in his mother; Depression in his brother and daughter; Early death in his father; Stroke in his mother. There is no history of Esophageal cancer.  ROS:   Please see the history of present illness.    All other systems reviewed and are negative.  EKGs/Labs/Other Studies Reviewed:    The following studies were reviewed today:  EKG today shows***  Recent Labs: 07/20/2022: BUN 17; Creatinine, Ser 1.07; Hemoglobin 16.3; Platelets 188; Potassium 4.1; Sodium 136  Recent Lipid Panel    Component Value Date/Time   CHOL 166 06/01/2021 0915   TRIG 76 06/01/2021 0915   HDL 46 06/01/2021 0915   CHOLHDL 3.6 06/01/2021 0915   LDLCALC 106 (H) 06/01/2021 0915    Physical Exam:    VS:  There were no vitals taken for this visit.    Wt Readings from Last 3 Encounters:  01/17/23 193 lb 12.8 oz (87.9 kg)  11/29/22 192 lb 1.9 oz (87.1 kg)  10/24/22 190 lb (86.2 kg)     GEN: *** Well nourished, well developed in no acute distress CARDIAC: ***RRR, no murmurs, rubs, gallops RESPIRATORY:  Clear to auscultation without rales, wheezing or rhonchi        ASSESSMENT:    1. Persistent atrial fibrillation ([place])   2. Primary hypertension    PLAN:    In order of problems listed above:  #Persistent atrial fibrillation Recently has transition to persistent atrial fibrillation.  He has had difficult to control ventricular rates.  We discussed treatment options again during today's clinic appointment including rate control, antiarrhythmic drugs and catheter ablation.  He would like to proceed with catheter ablation which I think is reasonable given his difficult to control ventricular rates while in atrial  fibrillation and symptoms of fatigue while out of rhythm.  Discussed treatment options today for their AF including antiarrhythmic drug therapy and ablation. Discussed risks, recovery and likelihood of success. Discussed potential need for repeat ablation procedures and antiarrhythmic drugs after an initial ablation. They wish to proceed with scheduling.  Risk, benefits, and alternatives to EP study and radiofrequency ablation for afib were also discussed in detail today. These risks include but are not limited to stroke, bleeding, vascular damage, tamponade, perforation, damage to the esophagus, lungs, and other structures, pulmonary vein stenosis, worsening renal function, and death. The patient understands these risk and wishes to proceed.  We will therefore proceed with catheter ablation at  the next available time.  Carto, ICE, anesthesia are requested for the procedure.  Will also obtain CT PV protocol prior to the procedure to exclude LAA thrombus and further evaluate atrial anatomy.   #Hypertension *** goal today.  Recommend checking blood pressures 1-2 times per week at home and recording the values.  Recommend bringing these recordings to the primary care physician.     Medication Adjustments/Labs and Tests Ordered: Current medicines are reviewed at length with the patient today.  Concerns regarding medicines are outlined above.  No orders of the defined types were placed in this encounter.  No orders of the defined types were placed in this encounter.    Signed, Lars Mage, MD, [facility], [facility] 01/20/2023 6:03 AM    Electrophysiology [place] [facility]

## 2023-01-23 ENCOUNTER — Ambulatory Visit: Payer: Medicare Other | Admitting: Medical-Surgical

## 2023-02-03 ENCOUNTER — Ambulatory Visit: Payer: Medicare Other | Admitting: Physician Assistant

## 2023-02-09 ENCOUNTER — Encounter: Payer: Self-pay | Admitting: Medical-Surgical

## 2023-02-09 ENCOUNTER — Ambulatory Visit: Payer: Medicare Other | Admitting: Medical-Surgical

## 2023-02-09 VITALS — BP 114/66 | HR 96 | Resp 20 | Ht 73.0 in | Wt 191.1 lb

## 2023-02-09 DIAGNOSIS — I4819 Other persistent atrial fibrillation: Secondary | ICD-10-CM | POA: Diagnosis not present

## 2023-02-09 DIAGNOSIS — F418 Other specified anxiety disorders: Secondary | ICD-10-CM

## 2023-02-09 MED ORDER — AMITRIPTYLINE HCL 25 MG PO TABS: 25.0000 mg | ORAL_TABLET | Freq: Every day | ORAL | 0 refills | Status: DC

## 2023-02-09 NOTE — Progress Notes (Signed)
Established Patient Office Visit  Subjective   Patient ID: Larry Odonnell, male   DOB: 11-11-1946 Age: 77 y.o. MRN: [id number]   Chief Complaint  Patient presents with   Follow-up    MOOD   HPI [place] 77 year old male accompanied by his wife presenting today for the following:  A-fib/hypertension: Being managed by cardiology.  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  Has an upcoming cardioversion in hopes that this will restabilize things.  Currently reports no concerning symptoms from the A-fib.  Mood: Previously treated for a number of years with Effexor.  We weaned off of Effexor last year at his request.  He was able to come off the medication without significant difficulty and has noted that the bowel problems he has been dealing with have fully resolved since discontinuing it.  Unfortunately, he has had an issue with lethargy and decreased motivation.  Notes that he was on Triavil many years ago and it worked great for him.  He is not sure why it was changed to Effexor or if this medication is even used but is interested in restarting something similar.  Felt that the Triavil helped to keep him looking forward rather than spending his days thinking about the past.  Most recently he describes waking up and as soon as his feet hit the floor in the morning, he has a "video playing inside his head" about things from the past and various events.  Notes that these are not all bad but he spends a good portion of his mornings and review rather than looking forward to what the day might bring.  Denies SI/HI.   Objective:    Vitals:   02/09/23 0925  BP: 114/66  Pulse: 96  Resp: 20  Height: 6' 1"$  (1.854 m)  Weight: 191 lb 1.3 oz (86.7 kg)  SpO2: 99%  BMI (Calculated): 25.22    Physical Exam Vitals reviewed.  Constitutional:      General: He is not in acute distress.    Appearance: Normal appearance. He is not ill-appearing.   HENT:     Head: Normocephalic and atraumatic.  Cardiovascular:     Rate and Rhythm: Normal rate and regular rhythm.     Pulses: Normal pulses.     Heart sounds: Normal heart sounds. No murmur heard.    No friction rub. No gallop.  Pulmonary:     Effort: Pulmonary effort is normal. No respiratory distress.     Breath sounds: Normal breath sounds.  Skin:    General: Skin is warm and dry.  Neurological:     Mental Status: He is alert and oriented to person, place, and time.  Psychiatric:        Mood and Affect: Mood normal.        Behavior: Behavior normal.        Thought Content: Thought content normal.        Judgment: Judgment normal.   No results found for this or any previous visit (from the past 24 hour(s)).     The 10-year ASCVD risk score (Arnett DK, et al., 2019) is: 25.9%   Values used to calculate the score:     Age: 34 years     Sex: Male     Is Non-Hispanic African American: No     Diabetic: No     Tobacco smoker: Yes     Systolic Blood Pressure: 99991111 mmHg  Is BP treated: Yes     HDL Cholesterol: 46 mg/dL     Total Cholesterol: 166 mg/dL   Assessment & Plan:   1. Anxiety with depression Reviewed options available.  We will go ahead and restart amitriptyline 25 mg nightly for now.  Discussed possibly adding Abilify but wants to hold off on this for now and see how amitriptyline works.  2. Persistent atrial fibrillation ([place]) Managed by cardiology.  Upcoming cardioversion.  Return in about 6 weeks (around 03/23/2023) for mood follow up. ___________________________________________ Clearnce Sorrel, DNP, APRN, FNP-BC Primary [facility] and [place]

## 2023-02-09 NOTE — H&P (View-Only) (Signed)
Established Patient Office Visit  Subjective   Patient ID: Larry Odonnell, male   DOB: 05/04/1946 Age: 77 y.o. MRN: [id number]   Chief Complaint  Patient presents with   Follow-up    MOOD   HPI Pleasant 76 year old male accompanied by his wife presenting today for the following:  A-fib/hypertension: Being managed by cardiology.  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  Has an upcoming cardioversion in hopes that this will restabilize things.  Currently reports no concerning symptoms from the A-fib.  Mood: Previously treated for a number of years with Effexor.  We weaned off of Effexor last year at his request.  He was able to come off the medication without significant difficulty and has noted that the bowel problems he has been dealing with have fully resolved since discontinuing it.  Unfortunately, he has had an issue with lethargy and decreased motivation.  Notes that he was on Triavil many years ago and it worked great for him.  He is not sure why it was changed to Effexor or if this medication is even used but is interested in restarting something similar.  Felt that the Triavil helped to keep him looking forward rather than spending his days thinking about the past.  Most recently he describes waking up and as soon as his feet hit the floor in the morning, he has a "video playing inside his head" about things from the past and various events.  Notes that these are not all bad but he spends a good portion of his mornings and review rather than looking forward to what the day might bring.  Denies SI/HI.   Objective:    Vitals:   02/09/23 0925  BP: 114/66  Pulse: 96  Resp: 20  Height: '6\' 1"'$  (1.854 m)  Weight: 191 lb 1.3 oz (86.7 kg)  SpO2: 99%  BMI (Calculated): 25.22    Physical Exam Vitals reviewed.  Constitutional:      General: He is not in acute distress.    Appearance: Normal appearance. He is not ill-appearing.   HENT:     Head: Normocephalic and atraumatic.  Cardiovascular:     Rate and Rhythm: Normal rate and regular rhythm.     Pulses: Normal pulses.     Heart sounds: Normal heart sounds. No murmur heard.    No friction rub. No gallop.  Pulmonary:     Effort: Pulmonary effort is normal. No respiratory distress.     Breath sounds: Normal breath sounds.  Skin:    General: Skin is warm and dry.  Neurological:     Mental Status: He is alert and oriented to person, place, and time.  Psychiatric:        Mood and Affect: Mood normal.        Behavior: Behavior normal.        Thought Content: Thought content normal.        Judgment: Judgment normal.   No results found for this or any previous visit (from the past 24 hour(s)).     The 10-year ASCVD risk score (Arnett DK, et al., 2019) is: 25.9%   Values used to calculate the score:     Age: 51 years     Sex: Male     Is Non-Hispanic African American: No     Diabetic: No     Tobacco smoker: Yes     Systolic Blood Pressure: 99991111 mmHg  Is BP treated: Yes     HDL Cholesterol: 46 mg/dL     Total Cholesterol: 166 mg/dL   Assessment & Plan:   1. Anxiety with depression Reviewed options available.  We will go ahead and restart amitriptyline 25 mg nightly for now.  Discussed possibly adding Abilify but wants to hold off on this for now and see how amitriptyline works.  2. Persistent atrial fibrillation ([place]) Managed by cardiology.  Upcoming cardioversion.  Return in about 6 weeks (around 03/23/2023) for mood follow up. ___________________________________________ Clearnce Sorrel, DNP, APRN, FNP-BC Primary Care and [place]

## 2023-02-10 ENCOUNTER — Ambulatory Visit: Payer: Medicare Other | Attending: Internal Medicine | Admitting: *Deleted

## 2023-02-10 DIAGNOSIS — I4891 Unspecified atrial fibrillation: Secondary | ICD-10-CM

## 2023-02-10 DIAGNOSIS — Z5181 Encounter for therapeutic drug level monitoring: Secondary | ICD-10-CM | POA: Diagnosis not present

## 2023-02-10 DIAGNOSIS — I824Y9 Acute embolism and thrombosis of unspecified deep veins of unspecified proximal lower extremity: Secondary | ICD-10-CM

## 2023-02-10 LAB — PROTIME-INR
INR: 6.1 (ref 0.9–1.2)
Prothrombin Time: 55.8 s — ABNORMAL HIGH (ref 9.1–12.0)

## 2023-02-10 LAB — POCT INR: POC INR: 6

## 2023-02-10 NOTE — Patient Instructions (Addendum)
Description   Hold warfarin 2/16, 2/17 and 2/18 Then START taking warfarin 92m daily except for 761mon Tuesday and Thursday. Recheck INR in 1 week (DCCV ON 2/29). Pt started amio on 1/26 [facility] [phone] Clearance needed prior to procedure.

## 2023-02-17 ENCOUNTER — Ambulatory Visit: Payer: Medicare Other | Attending: Cardiovascular Disease

## 2023-02-17 ENCOUNTER — Telehealth: Payer: Self-pay

## 2023-02-17 DIAGNOSIS — Z5181 Encounter for therapeutic drug level monitoring: Secondary | ICD-10-CM

## 2023-02-17 DIAGNOSIS — Z86718 Personal history of other venous thrombosis and embolism: Secondary | ICD-10-CM | POA: Diagnosis not present

## 2023-02-17 DIAGNOSIS — I824Y9 Acute embolism and thrombosis of unspecified deep veins of unspecified proximal lower extremity: Secondary | ICD-10-CM

## 2023-02-17 DIAGNOSIS — I4891 Unspecified atrial fibrillation: Secondary | ICD-10-CM

## 2023-02-17 LAB — POCT INR: INR: 1.3 — AB (ref 2.0–3.0)

## 2023-02-17 NOTE — Telephone Encounter (Signed)
Pt came to Anticoagulation Clinic today for INR check. Last week's INR was 6.1 and today's INR was 1.3. Pt is scheduled for DCCV next week on 02/23/23 with Dr Johnsie Cancel; however, with recent nontherapeutic INR results, unsure if MD will want to proceed with procedure.  Sent Bernestine Amass, RN and Winifred Olive, LPN a message concerning pt's recent INR results and his scheduled DCCV.

## 2023-02-17 NOTE — Patient Instructions (Signed)
Description   Take an extra '5mg'$  tablet today and then resume taking warfarin '5mg'$  daily except for '7mg'$  on Tuesday and Thursday.  Recheck INR in 1 week (DCCV ON 2/29).  Pt started amio on 1/26 Anticoagulation Clinic [phone] Cardiac Clearance needed prior to procedure.

## 2023-02-17 NOTE — Telephone Encounter (Signed)
Spoke with DOD, Dr. Radford Pax. Recommend pushing out cardioversion several weeks or Lovenox bridge with TEE prior to cardioversion. Will leave this up to Dr. Quentin Ore and Dr. Johnsie Cancel.

## 2023-02-20 ENCOUNTER — Ambulatory Visit: Payer: Medicare Other | Attending: Cardiology

## 2023-02-20 DIAGNOSIS — I10 Essential (primary) hypertension: Secondary | ICD-10-CM

## 2023-02-20 DIAGNOSIS — I4819 Other persistent atrial fibrillation: Secondary | ICD-10-CM

## 2023-02-20 LAB — COMPREHENSIVE METABOLIC PANEL
ALT: 20 IU/L (ref 0–44)
AST: 18 IU/L (ref 0–40)
Albumin/Globulin Ratio: 2 (ref 1.2–2.2)
Albumin: 4.5 g/dL (ref 3.8–4.8)
Alkaline Phosphatase: 64 IU/L (ref 44–121)
BUN/Creatinine Ratio: 13 (ref 10–24)
BUN: 15 mg/dL (ref 8–27)
Bilirubin Total: 0.2 mg/dL (ref 0.0–1.2)
CO2: 27 mmol/L (ref 20–29)
Calcium: 9.5 mg/dL (ref 8.6–10.2)
Chloride: 102 mmol/L (ref 96–106)
Creatinine, Ser: 1.16 mg/dL (ref 0.76–1.27)
Globulin, Total: 2.3 g/dL (ref 1.5–4.5)
Glucose: 112 mg/dL — ABNORMAL HIGH (ref 70–99)
Potassium: 4.4 mmol/L (ref 3.5–5.2)
Sodium: 138 mmol/L (ref 134–144)
Total Protein: 6.8 g/dL (ref 6.0–8.5)
eGFR: 65 mL/min/{1.73_m2} (ref >59)

## 2023-02-20 LAB — CBC WITH DIFFERENTIAL/PLATELET
Basophils Absolute: 0 10*3/uL (ref 0.0–0.2)
Basos: 0 %
EOS (ABSOLUTE): 0.4 10*3/uL (ref 0.0–0.4)
Eos: 5 %
Hematocrit: 45.8 % (ref 37.5–51.0)
Hemoglobin: 15.6 g/dL (ref 13.0–17.7)
Lymphocytes Absolute: 2.7 10*3/uL (ref 0.7–3.1)
Lymphs: 28 %
MCH: 31.7 pg (ref 26.6–33.0)
MCHC: 34.1 g/dL (ref 31.5–35.7)
MCV: 93 fL (ref 79–97)
Monocytes Absolute: 1.2 10*3/uL — ABNORMAL HIGH (ref 0.1–0.9)
Monocytes: 13 %
Neutrophils Absolute: 5.1 10*3/uL (ref 1.4–7.0)
Neutrophils: 54 %
Platelets: 173 10*3/uL (ref 150–450)
RBC: 4.92 x10E6/uL (ref 4.14–5.80)
RDW: 14 % (ref 11.6–15.4)
WBC: 9.5 10*3/uL (ref 3.4–10.8)

## 2023-02-21 ENCOUNTER — Ambulatory Visit: Payer: Medicare Other | Attending: Cardiology | Admitting: *Deleted

## 2023-02-21 DIAGNOSIS — I4891 Unspecified atrial fibrillation: Secondary | ICD-10-CM

## 2023-02-21 DIAGNOSIS — I824Y9 Acute embolism and thrombosis of unspecified deep veins of unspecified proximal lower extremity: Secondary | ICD-10-CM | POA: Diagnosis not present

## 2023-02-21 DIAGNOSIS — Z5181 Encounter for therapeutic drug level monitoring: Secondary | ICD-10-CM | POA: Diagnosis not present

## 2023-02-21 LAB — TSH: TSH: 3.92 u[IU]/mL (ref 0.450–4.500)

## 2023-02-21 LAB — POCT INR: POC INR: 2.5

## 2023-02-21 LAB — T4, FREE: Free T4: 1.37 ng/dL (ref 0.82–1.77)

## 2023-02-21 NOTE — Patient Instructions (Signed)
Description   Continue taking warfarin '5mg'$  daily except for '7mg'$  on Tuesday and Thursday.  Recheck INR in 1 week (DCCV ON 2/29).  Pt started amio on 1/26 Anticoagulation Clinic [phone] Cardiac Clearance needed prior to procedure.

## 2023-02-21 NOTE — Telephone Encounter (Signed)
Patient INR is therapeutic today. Per Dr. Quentin Ore patient should be okay for cardioversion with TEE on the table prior.

## 2023-02-21 NOTE — Telephone Encounter (Signed)
Left message with endoscopy

## 2023-02-22 ENCOUNTER — Telehealth: Payer: Self-pay | Admitting: Cardiology

## 2023-02-22 NOTE — Telephone Encounter (Signed)
TEE has been added on. Patient is aware.

## 2023-02-22 NOTE — Telephone Encounter (Signed)
Patient called wanted clarification with his scheduled procedure tomorrow. Explained what time patient should arrive at hospital and the reason for the additional procedure.

## 2023-02-22 NOTE — Telephone Encounter (Signed)
Patient stated he needs to get instructions for tomorrow's procedure.

## 2023-02-23 ENCOUNTER — Encounter (HOSPITAL_COMMUNITY)
Admission: RE | Disposition: A | Discharge status: Home / Self Care | Payer: Self-pay | Source: Home / Self Care | Attending: Cardiovascular Disease

## 2023-02-23 ENCOUNTER — Other Ambulatory Visit: Payer: Self-pay

## 2023-02-23 ENCOUNTER — Encounter (HOSPITAL_COMMUNITY): Payer: Self-pay

## 2023-02-23 ENCOUNTER — Ambulatory Visit (HOSPITAL_COMMUNITY)
Admission: RE | Admit: 2023-02-23 | Discharge: 2023-02-23 | Disposition: A | Discharge status: Home / Self Care | Payer: Medicare Other | Attending: Cardiovascular Disease | Admitting: Cardiovascular Disease

## 2023-02-23 ENCOUNTER — Ambulatory Visit (HOSPITAL_COMMUNITY): Payer: Medicare Other | Admitting: Certified Registered"

## 2023-02-23 ENCOUNTER — Ambulatory Visit (HOSPITAL_BASED_OUTPATIENT_CLINIC_OR_DEPARTMENT_OTHER): Payer: Medicare Other | Admitting: Certified Registered"

## 2023-02-23 ENCOUNTER — Ambulatory Visit (HOSPITAL_COMMUNITY)
Admission: RE | Admit: 2023-02-23 | Discharge: 2023-02-23 | Disposition: A | Discharge status: Home / Self Care | Payer: Medicare Other | Source: Ambulatory Visit | Attending: Cardiovascular Disease | Admitting: Cardiovascular Disease

## 2023-02-23 ENCOUNTER — Encounter (HOSPITAL_COMMUNITY): Payer: Self-pay | Admitting: Cardiovascular Disease

## 2023-02-23 DIAGNOSIS — F418 Other specified anxiety disorders: Secondary | ICD-10-CM | POA: Insufficient documentation

## 2023-02-23 DIAGNOSIS — I340 Nonrheumatic mitral (valve) insufficiency: Secondary | ICD-10-CM

## 2023-02-23 DIAGNOSIS — I10 Essential (primary) hypertension: Secondary | ICD-10-CM | POA: Diagnosis not present

## 2023-02-23 DIAGNOSIS — I4891 Unspecified atrial fibrillation: Secondary | ICD-10-CM

## 2023-02-23 DIAGNOSIS — I4819 Other persistent atrial fibrillation: Secondary | ICD-10-CM | POA: Insufficient documentation

## 2023-02-23 DIAGNOSIS — F17210 Nicotine dependence, cigarettes, uncomplicated: Secondary | ICD-10-CM | POA: Diagnosis not present

## 2023-02-23 DIAGNOSIS — I4811 Longstanding persistent atrial fibrillation: Secondary | ICD-10-CM

## 2023-02-23 HISTORY — PX: CARDIOVERSION: SHX1299

## 2023-02-23 HISTORY — PX: TEE WITHOUT CARDIOVERSION: SHX5443

## 2023-02-23 LAB — ECHO TEE

## 2023-02-23 SURGERY — ECHOCARDIOGRAM, TRANSESOPHAGEAL
Anesthesia: Monitor Anesthesia Care

## 2023-02-23 MED ORDER — PROPOFOL 10 MG/ML IV BOLUS
INTRAVENOUS | Status: DC | PRN
  Administered 2023-02-23: 50 mg via INTRAVENOUS
  Administered 2023-02-23: 30 mg via INTRAVENOUS
  Administered 2023-02-23: 200 ug/kg/min via INTRAVENOUS

## 2023-02-23 MED ORDER — SODIUM CHLORIDE 0.9 % IV SOLN: INTRAVENOUS | Status: DC

## 2023-02-23 MED ORDER — EPHEDRINE SULFATE-NACL 50-0.9 MG/10ML-% IV SOSY
PREFILLED_SYRINGE | INTRAVENOUS | Status: DC | PRN
  Administered 2023-02-23: 10 mg via INTRAVENOUS
  Administered 2023-02-23: 5 mg via INTRAVENOUS
  Administered 2023-02-23 (×3): 10 mg via INTRAVENOUS
  Administered 2023-02-23: 5 mg via INTRAVENOUS

## 2023-02-23 MED ORDER — LACTATED RINGERS IV SOLN: INTRAVENOUS | Status: DC | PRN

## 2023-02-23 MED ORDER — PHENYLEPHRINE 80 MCG/ML (10ML) SYRINGE FOR IV PUSH (FOR BLOOD PRESSURE SUPPORT)
PREFILLED_SYRINGE | INTRAVENOUS | Status: DC | PRN
  Administered 2023-02-23: 80 ug via INTRAVENOUS

## 2023-02-23 NOTE — Anesthesia Procedure Notes (Signed)
Procedure Name: MAC Date/Time: 02/23/2023 9:17 AM  Performed by: Gwyndolyn Saxon, CRNAPre-anesthesia Checklist: Patient identified, Emergency Drugs available, Suction available and Patient being monitored Patient Re-evaluated:Patient Re-evaluated prior to induction Oxygen Delivery Method: Nasal cannula Preoxygenation: Pre-oxygenation with 100% oxygen Induction Type: IV induction Placement Confirmation: positive ETCO2 and breath sounds checked- equal and bilateral Dental Injury: Teeth and Oropharynx as per pre-operative assessment

## 2023-02-23 NOTE — Progress Notes (Signed)
Echocardiogram Echocardiogram Transesophageal has been performed.  Fidel Levy 02/23/2023, 9:40 AM

## 2023-02-23 NOTE — Discharge Instructions (Signed)
TEE  YOU HAD AN CARDIAC PROCEDURE TODAY: Refer to the procedure report and other information in the discharge instructions given to you for any specific questions about what was found during the examination. If this information does not answer your questions, please call [place] office at [phone] to clarify.   DIET: Your first meal following the procedure should be a light meal and then it is ok to progress to your normal diet. A half-sandwich or bowl of soup is an example of a good first meal. Heavy or fried foods are harder to digest and may make you feel nauseous or bloated. Drink plenty of fluids but you should avoid alcoholic beverages for 24 hours. If you had a esophageal dilation, please see attached instructions for diet.   ACTIVITY: Your care partner should take you home directly after the procedure. You should plan to take it easy, moving slowly for the rest of the day. You can resume normal activity the day after the procedure however YOU SHOULD NOT DRIVE, use power tools, machinery or perform tasks that involve climbing or major physical exertion for 24 hours (because of the sedation medicines used during the test).   SYMPTOMS TO REPORT IMMEDIATELY: A cardiologist can be reached at any hour. Please call [phone] for any of the following symptoms:  Vomiting of blood or coffee ground material  New, significant abdominal pain  New, significant chest pain or pain under the shoulder blades  Painful or persistently difficult swallowing  New shortness of breath  Black, tarry-looking or red, bloody stools  FOLLOW UP:  Please also call with any specific questions about appointments or follow up tests.  Electrical Cardioversion Electrical cardioversion is the delivery of a jolt of electricity to restore a normal rhythm to the heart. A rhythm that is too fast or is not regular keeps the heart from pumping well. In this procedure, sticky patches or metal paddles are placed on the  chest to deliver electricity to the heart from a device. This procedure may be done in an emergency if: There is low or no blood pressure as a result of the heart rhythm. Normal rhythm must be restored as fast as possible to protect the brain and heart from further damage. It may save a life. This may also be a scheduled procedure for irregular or fast heart rhythms that are not immediately life-threatening.  What can I expect after the procedure? Your blood pressure, heart rate, breathing rate, and blood oxygen level will be monitored until you leave the hospital or clinic. Your heart rhythm will be watched to make sure it does not change. You may have some redness on the skin where the shocks were given. Over the counter cortizone cream may be helpful.  Follow these instructions at home: Do not drive for 24 hours if you were given a sedative during your procedure. Take over-the-counter and prescription medicines only as told by your health care provider. Ask your health care provider how to check your pulse. Check it often. Rest for 48 hours after the procedure or as told by your health care provider. Avoid or limit your caffeine use as told by your health care provider. Keep all follow-up visits as told by your health care provider. This is important. Contact a health care provider if: You feel like your heart is beating too quickly or your pulse is not regular. You have a serious muscle cramp that does not go away. Get help right away if: You have discomfort in  your chest. You are dizzy or you feel faint. You have trouble breathing or you are short of breath. Your speech is slurred. You have trouble moving an arm or leg on one side of your body. Your fingers or toes turn cold or blue. Summary Electrical cardioversion is the delivery of a jolt of electricity to restore a normal rhythm to the heart. This procedure may be done right away in an emergency or may be a scheduled procedure  if the condition is not an emergency. Generally, this is a safe procedure. After the procedure, check your pulse often as told by your health care provider. This information is not intended to replace advice given to you by your health care provider. Make sure you discuss any questions you have with your health care provider. Document Revised: 07/15/2019 Document Reviewed: 07/15/2019 Elsevier Patient Education  [place].

## 2023-02-23 NOTE — Anesthesia Preprocedure Evaluation (Signed)
Anesthesia Evaluation  Patient identified by MRN, date of birth, ID band Patient awake    Reviewed: Allergy & Precautions, NPO status , Patient's Chart, lab work & pertinent test results  History of Anesthesia Complications Negative for: history of anesthetic complications  Airway Mallampati: II  TM Distance: >3 FB Neck ROM: Full    Dental  (+) Teeth Intact   Pulmonary Current Smoker, PE   Pulmonary exam normal        Cardiovascular hypertension, + DVT  Normal cardiovascular exam+ dysrhythmias Atrial Fibrillation   Echo 03/25/21: EF 55-60%, no RWMA, normal DF, normal RVSF, trivial MR, no sig valve disease   Neuro/Psych negative neurological ROS     GI/Hepatic negative GI ROS, Neg liver ROS,,,  Endo/Other  negative endocrine ROS    Renal/GU negative Renal ROS  negative genitourinary   Musculoskeletal  (+) Arthritis ,    Abdominal   Peds  Hematology negative hematology ROS (+)   Anesthesia Other Findings   Reproductive/Obstetrics                             Anesthesia Physical Anesthesia Plan  ASA: 3  Anesthesia Plan: MAC   Post-op Pain Management: Minimal or no pain anticipated   Induction: Intravenous  PONV Risk Score and Plan: 1 and TIVA and Treatment may vary due to age or medical condition  Airway Management Planned: Nasal Cannula  Additional Equipment: None  Intra-op Plan:   Post-operative Plan:   Informed Consent: I have reviewed the patients History and Physical, chart, labs and discussed the procedure including the risks, benefits and alternatives for the proposed anesthesia with the patient or authorized representative who has indicated his/her understanding and acceptance.       Plan Discussed with:   Anesthesia Plan Comments:         Anesthesia Quick Evaluation

## 2023-02-23 NOTE — CV Procedure (Signed)
TEE/DCC:  EF 55% No LAA thrombus Mild MR No PFO Normal RV No effusion  DCC x 1 150J converted to NSR with no immediate neurologic sequelae On Rx coumadin with INR 2.5 02/21/23  Jenkins Rouge MD [facility]

## 2023-02-23 NOTE — Anesthesia Postprocedure Evaluation (Signed)
Anesthesia Post Note  Patient: Larry Odonnell [facility]  Procedure(s) Performed: TRANSESOPHAGEAL ECHOCARDIOGRAM (TEE) CARDIOVERSION     Patient location during evaluation: PACU Anesthesia Type: MAC Level of consciousness: awake and alert Pain management: pain level controlled Vital Signs Assessment: post-procedure vital signs reviewed and stable Respiratory status: spontaneous breathing, nonlabored ventilation and respiratory function stable Cardiovascular status: blood pressure returned to baseline and stable Postop Assessment: no apparent nausea or vomiting Anesthetic complications: no   No notable events documented.  Last Vitals:  Vitals:   02/23/23 1010 02/23/23 1020  BP: (!) 102/58 (!) 101/57  Pulse: 60 62  Resp: 18 17  Temp:    SpO2: 92% 93%    Last Pain:  Vitals:   02/23/23 1020  TempSrc:   PainSc: 0-No pain                 Lynda Rainwater

## 2023-02-23 NOTE — Interval H&P Note (Signed)
History and Physical Interval Note:  02/23/2023 8:54 AM  Larry Odonnell  has presented today for surgery, with the diagnosis of AFIB.  The various methods of treatment have been discussed with the patient and family. After consideration of risks, benefits and other options for treatment, the patient has consented to  Procedure(s): TRANSESOPHAGEAL ECHOCARDIOGRAM (TEE) (N/A) CARDIOVERSION (N/A) as a surgical intervention.  The patient's history has been reviewed, patient examined, no change in status, stable for surgery.  I have reviewed the patient's chart and labs.  Questions were answered to the patient's satisfaction.     Jenkins Rouge

## 2023-02-23 NOTE — Transfer of Care (Signed)
Immediate Anesthesia Transfer of Care Note  Patient: Larry Odonnell  Procedure(s) Performed: TRANSESOPHAGEAL ECHOCARDIOGRAM (TEE) CARDIOVERSION  Patient Location: PACU and Endoscopy Unit  Anesthesia Type:MAC  Level of Consciousness: drowsy and patient cooperative  Airway & Oxygen Therapy: Patient Spontanous Breathing and Patient connected to nasal cannula oxygen  Post-op Assessment: Report given to RN and Post -op Vital signs reviewed and stable  Post vital signs: Reviewed and stable  Last Vitals:  Vitals Value Taken Time  BP    Temp    Pulse    Resp    SpO2      Last Pain:  Vitals:   02/23/23 0827  TempSrc: Oral  PainSc: 0-No pain         Complications: No notable events documented.

## 2023-02-26 ENCOUNTER — Encounter (HOSPITAL_COMMUNITY): Payer: Self-pay | Admitting: Cardiovascular Disease

## 2023-03-01 ENCOUNTER — Ambulatory Visit: Payer: Medicare Other

## 2023-03-02 ENCOUNTER — Telehealth: Payer: Self-pay | Admitting: Cardiology

## 2023-03-02 NOTE — Telephone Encounter (Signed)
I have faxed over cardiac clearance recommendations to [place] at [facility].

## 2023-03-02 NOTE — Telephone Encounter (Signed)
Katharine Look from [facility] called following up about medical clearance for the pt. She said it would go to [place] at [employer] [employer]. The fax is [phone]

## 2023-03-06 ENCOUNTER — Ambulatory Visit: Payer: Medicare Other | Attending: Cardiovascular Disease

## 2023-03-06 DIAGNOSIS — I4891 Unspecified atrial fibrillation: Secondary | ICD-10-CM | POA: Diagnosis not present

## 2023-03-06 DIAGNOSIS — I824Y9 Acute embolism and thrombosis of unspecified deep veins of unspecified proximal lower extremity: Secondary | ICD-10-CM | POA: Diagnosis not present

## 2023-03-06 DIAGNOSIS — Z5181 Encounter for therapeutic drug level monitoring: Secondary | ICD-10-CM | POA: Diagnosis not present

## 2023-03-06 LAB — POCT INR: INR: 3.3 — AB (ref 2.0–3.0)

## 2023-03-06 NOTE — Patient Instructions (Signed)
Description   Eat a serving of greens today and continue taking warfarin '5mg'$  daily except for '7mg'$  on Tuesday and Thursday.  Recheck INR in 2 weeks Pt started amio on 1/26 Anticoagulation Clinic [phone] Cardiac Clearance needed prior to procedure.

## 2023-03-08 ENCOUNTER — Ambulatory Visit: Payer: Medicare Other

## 2023-03-20 ENCOUNTER — Ambulatory Visit: Payer: Medicare Other | Attending: Cardiovascular Disease | Admitting: Pharmacist

## 2023-03-20 DIAGNOSIS — I2693 Single subsegmental pulmonary embolism without acute cor pulmonale: Secondary | ICD-10-CM | POA: Diagnosis not present

## 2023-03-20 DIAGNOSIS — I4891 Unspecified atrial fibrillation: Secondary | ICD-10-CM

## 2023-03-20 DIAGNOSIS — I824Y9 Acute embolism and thrombosis of unspecified deep veins of unspecified proximal lower extremity: Secondary | ICD-10-CM | POA: Diagnosis not present

## 2023-03-20 DIAGNOSIS — Z5181 Encounter for therapeutic drug level monitoring: Secondary | ICD-10-CM

## 2023-03-20 LAB — POCT INR: INR: 3.8 — AB (ref 2.0–3.0)

## 2023-03-20 NOTE — Patient Instructions (Signed)
Description   Eat a serving of greens today and reduce dose to 5mg  daily Recheck INR in 2 weeks Pt started amio on 1/26 Anticoagulation Clinic [phone] Cardiac Clearance needed prior to procedure.

## 2023-03-23 ENCOUNTER — Ambulatory Visit: Payer: Medicare Other | Admitting: Medical-Surgical

## 2023-03-23 ENCOUNTER — Encounter: Payer: Self-pay | Admitting: Medical-Surgical

## 2023-03-23 VITALS — BP 129/63 | HR 54 | Resp 20 | Ht 73.0 in | Wt 199.0 lb

## 2023-03-23 DIAGNOSIS — F418 Other specified anxiety disorders: Secondary | ICD-10-CM

## 2023-03-23 MED ORDER — AMITRIPTYLINE HCL 50 MG PO TABS: 50.0000 mg | ORAL_TABLET | Freq: Every day | ORAL | 1 refills | Status: DC

## 2023-03-23 NOTE — Progress Notes (Signed)
        Established patient visit  History, exam, impression, and plan:  Anxiety with depression Has been taking amitriptyline 25 mg nightly for the last 6 weeks, tolerating well.  Has noted a bit of mild constipation but this is manageable.  Feels that medication might be helping some but he does still have dreams at night that could be quite disturbing.  No excessive grogginess or other side effects from the medication.  Overall, mood is stable, normal affect, normal cognition.  Increasing amitriptyline to 50 mg nightly.  Plan follow-up in 6 weeks to evaluate tolerance and response.  Procedures performed this visit: None.  Return in about 6 weeks (around 05/04/2023) for mood follow up.  __________________________________ Clearnce Sorrel, DNP, APRN, FNP-BC Primary Care and [place]

## 2023-03-23 NOTE — Assessment & Plan Note (Signed)
Has been taking amitriptyline 25 mg nightly for the last 6 weeks, tolerating well.  Has noted a bit of mild constipation but this is manageable.  Feels that medication might be helping some but he does still have dreams at night that could be quite disturbing.  No excessive grogginess or other side effects from the medication.  Overall, mood is stable, normal affect, normal cognition.  Increasing amitriptyline to 50 mg nightly.  Plan follow-up in 6 weeks to evaluate tolerance and response.

## 2023-04-04 ENCOUNTER — Ambulatory Visit: Payer: Medicare Other | Attending: Cardiology | Admitting: *Deleted

## 2023-04-04 DIAGNOSIS — I824Y9 Acute embolism and thrombosis of unspecified deep veins of unspecified proximal lower extremity: Secondary | ICD-10-CM

## 2023-04-04 DIAGNOSIS — I2693 Single subsegmental pulmonary embolism without acute cor pulmonale: Secondary | ICD-10-CM | POA: Diagnosis not present

## 2023-04-04 DIAGNOSIS — Z5181 Encounter for therapeutic drug level monitoring: Secondary | ICD-10-CM | POA: Diagnosis not present

## 2023-04-04 DIAGNOSIS — I4891 Unspecified atrial fibrillation: Secondary | ICD-10-CM | POA: Diagnosis not present

## 2023-04-04 LAB — POCT INR: INR: 2.9 (ref 2.0–3.0)

## 2023-04-04 NOTE — Patient Instructions (Addendum)
Description   Continue taking warfarin 5mg  daily. Recheck INR in 2 weeks. Pending ablation on 05/08/23-weekly checks needed. Pt started amio on 1/26 Anticoagulation Clinic [phone] Cardiac Clearance needed prior to procedure.

## 2023-04-11 ENCOUNTER — Encounter: Payer: Self-pay | Admitting: Cardiology

## 2023-04-11 ENCOUNTER — Ambulatory Visit: Payer: Medicare Other | Attending: Cardiology | Admitting: *Deleted

## 2023-04-11 DIAGNOSIS — I824Y9 Acute embolism and thrombosis of unspecified deep veins of unspecified proximal lower extremity: Secondary | ICD-10-CM

## 2023-04-11 DIAGNOSIS — Z5181 Encounter for therapeutic drug level monitoring: Secondary | ICD-10-CM

## 2023-04-11 DIAGNOSIS — I4891 Unspecified atrial fibrillation: Secondary | ICD-10-CM | POA: Diagnosis not present

## 2023-04-11 DIAGNOSIS — I2693 Single subsegmental pulmonary embolism without acute cor pulmonale: Secondary | ICD-10-CM

## 2023-04-11 LAB — POCT INR: INR: 3.1 — AB (ref 2.0–3.0)

## 2023-04-11 NOTE — Patient Instructions (Signed)
Description   Today have a serving of leafy veggies then continue taking warfarin  daily. Recheck INR in 2 weeks. Pending ablation on 05/08/23-weekly checks needed. Pt started amio on 1/26 [facility]ticoagulation Clinic [phone] Cardiac Clearance needed prior to procedure.

## 2023-04-18 ENCOUNTER — Other Ambulatory Visit: Payer: Self-pay

## 2023-04-18 ENCOUNTER — Ambulatory Visit: Payer: Medicare Other | Attending: Internal Medicine

## 2023-04-18 DIAGNOSIS — I4891 Unspecified atrial fibrillation: Secondary | ICD-10-CM

## 2023-04-18 DIAGNOSIS — I824Y9 Acute embolism and thrombosis of unspecified deep veins of unspecified proximal lower extremity: Secondary | ICD-10-CM | POA: Diagnosis not present

## 2023-04-18 DIAGNOSIS — Z5181 Encounter for therapeutic drug level monitoring: Secondary | ICD-10-CM | POA: Diagnosis not present

## 2023-04-18 DIAGNOSIS — I2693 Single subsegmental pulmonary embolism without acute cor pulmonale: Secondary | ICD-10-CM

## 2023-04-18 LAB — POCT INR: INR: 3.3 — AB (ref 2.0–3.0)

## 2023-04-18 NOTE — Patient Instructions (Signed)
Description   Start taking warfarin  daily except 2.5mg  on Tuesdays.  Recheck INR in 1 week. Pending ablation on 05/08/23-weekly checks needed. Pt started amio on 1/26 Anticoagulation Clinic [phone] Cardiac Clearance needed prior to procedure.

## 2023-04-25 ENCOUNTER — Other Ambulatory Visit: Payer: Self-pay

## 2023-04-25 ENCOUNTER — Ambulatory Visit: Payer: Medicare Other | Attending: Cardiology

## 2023-04-25 DIAGNOSIS — I2693 Single subsegmental pulmonary embolism without acute cor pulmonale: Secondary | ICD-10-CM

## 2023-04-25 DIAGNOSIS — I4891 Unspecified atrial fibrillation: Secondary | ICD-10-CM

## 2023-04-25 DIAGNOSIS — Z5181 Encounter for therapeutic drug level monitoring: Secondary | ICD-10-CM | POA: Diagnosis not present

## 2023-04-25 DIAGNOSIS — I824Y9 Acute embolism and thrombosis of unspecified deep veins of unspecified proximal lower extremity: Secondary | ICD-10-CM

## 2023-04-25 LAB — POCT INR: INR: 2.8 (ref 2.0–3.0)

## 2023-04-25 NOTE — Patient Instructions (Signed)
Description   Continue on same dosage of Warfarin 5mg  daily except 2.5mg  on Tuesdays.  Recheck INR in 1 week. Pending ablation on 05/08/23-weekly checks needed. Pt started amio on 1/26 Anticoagulation Clinic [phone] Cardiac Clearance needed prior to procedure.

## 2023-04-26 LAB — BASIC METABOLIC PANEL
BUN/Creatinine Ratio: 13 (ref 10–24)
BUN: 15 mg/dL (ref 8–27)
CO2: 24 mmol/L (ref 20–29)
Calcium: 9.5 mg/dL (ref 8.6–10.2)
Chloride: 99 mmol/L (ref 96–106)
Creatinine, Ser: 1.12 mg/dL (ref 0.76–1.27)
Glucose: 96 mg/dL (ref 70–99)
Potassium: 4.7 mmol/L (ref 3.5–5.2)
Sodium: 137 mmol/L (ref 134–144)
eGFR: 68 mL/min/{1.73_m2} (ref >59)

## 2023-04-26 LAB — CBC WITH DIFFERENTIAL/PLATELET
Basophils Absolute: 0.1 10*3/uL (ref 0.0–0.2)
Basos: 1 %
EOS (ABSOLUTE): 0.3 10*3/uL (ref 0.0–0.4)
Eos: 4 %
Hematocrit: 44.8 % (ref 37.5–51.0)
Hemoglobin: 14.9 g/dL (ref 13.0–17.7)
Immature Grans (Abs): 0.1 10*3/uL (ref 0.0–0.1)
Immature Granulocytes: 1 %
Lymphocytes Absolute: 2.3 10*3/uL (ref 0.7–3.1)
Lymphs: 29 %
MCH: 31.4 pg (ref 26.6–33.0)
MCHC: 33.3 g/dL (ref 31.5–35.7)
MCV: 95 fL (ref 79–97)
Monocytes Absolute: 0.9 10*3/uL (ref 0.1–0.9)
Monocytes: 12 %
Neutrophils Absolute: 4.4 10*3/uL (ref 1.4–7.0)
Neutrophils: 53 %
Platelets: 205 10*3/uL (ref 150–450)
RBC: 4.74 x10E6/uL (ref 4.14–5.80)
RDW: 13.1 % (ref 11.6–15.4)
WBC: 8.1 10*3/uL (ref 3.4–10.8)

## 2023-04-28 ENCOUNTER — Telehealth (HOSPITAL_COMMUNITY): Payer: Self-pay | Admitting: *Deleted

## 2023-04-28 NOTE — Telephone Encounter (Signed)
Reaching out to patient to offer assistance regarding upcoming cardiac imaging study; pt verbalizes understanding of appt date/time, parking situation and where to check in, pre-test NPO status and verified current allergies; name and call back number provided for further questions should they arise  Farouk Vivero RN Navigator Cardiac Imaging [facility] [phone] office [phone] cell  Patient to take his daily medications and is aware to arrive at 1pm. 

## 2023-05-01 ENCOUNTER — Ambulatory Visit (HOSPITAL_COMMUNITY)
Admission: RE | Admit: 2023-05-01 | Discharge: 2023-05-01 | Disposition: A | Discharge status: Home / Self Care | Payer: Medicare Other | Source: Ambulatory Visit | Attending: Cardiology | Admitting: Cardiology

## 2023-05-01 DIAGNOSIS — I4819 Other persistent atrial fibrillation: Secondary | ICD-10-CM | POA: Diagnosis not present

## 2023-05-01 DIAGNOSIS — I10 Essential (primary) hypertension: Secondary | ICD-10-CM

## 2023-05-01 MED ORDER — IOHEXOL 350 MG/ML SOLN
95.0000 mL | Freq: Once | INTRAVENOUS | Status: AC | PRN
  Administered 2023-05-01: 95 mL via INTRAVENOUS

## 2023-05-02 ENCOUNTER — Ambulatory Visit: Payer: Medicare Other | Attending: Cardiology | Admitting: *Deleted

## 2023-05-02 DIAGNOSIS — I824Y9 Acute embolism and thrombosis of unspecified deep veins of unspecified proximal lower extremity: Secondary | ICD-10-CM | POA: Diagnosis not present

## 2023-05-02 DIAGNOSIS — Z5181 Encounter for therapeutic drug level monitoring: Secondary | ICD-10-CM

## 2023-05-02 DIAGNOSIS — I4891 Unspecified atrial fibrillation: Secondary | ICD-10-CM | POA: Diagnosis not present

## 2023-05-02 LAB — POCT INR: POC INR: 2.3

## 2023-05-02 NOTE — Patient Instructions (Signed)
Description   Continue on same dosage of Warfarin 5mg  daily except 2.5mg  on Tuesdays.  Recheck INR in 1 week post ablation on 5/13. Pt started amio on 1/26 Anticoagulation Clinic [phone] Cardiac Clearance needed prior to procedure.

## 2023-05-04 ENCOUNTER — Ambulatory Visit: Payer: Medicare Other | Admitting: Medical-Surgical

## 2023-05-04 ENCOUNTER — Encounter: Payer: Self-pay | Admitting: Medical-Surgical

## 2023-05-04 VITALS — BP 126/75 | HR 53 | Temp 98.2°F | Ht 73.0 in | Wt 203.4 lb

## 2023-05-04 DIAGNOSIS — F418 Other specified anxiety disorders: Secondary | ICD-10-CM

## 2023-05-04 NOTE — Progress Notes (Signed)
        Established patient visit  History, exam, impression, and plan:  1. Anxiety with depression Pleasant 77 year old male presenting today to follow-up on mood concerns.  Started amitriptyline and completed 4-6 weeks at 25 mg daily however this was not completely effective.  We increased his dose approximately 6 weeks ago to 50 mg nightly.  He tolerated the increase well without side effects or intolerances.  Notes that he is sleeping very well now.  He still has moments where he revisits past experiences but no longer dwells on them.  Overall feels the medication is a good fit for him and he is at a good place.  Denies SI/HI.  Continue amitriptyline 50 mg nightly as prescribed.      05/04/2023    8:14 AM 03/23/2023    8:33 AM 11/29/2022   10:51 AM 08/22/2022    9:04 AM 08/09/2022    9:15 AM  Depression screen PHQ 2/9  Decreased Interest 1 0 0 0 0  Down, Depressed, Hopeless 0 0 0 0 0  PHQ - 2 Score 1 0 0 0 0  Altered sleeping 0   0 0  Tired, decreased energy 1   0 1  Change in appetite 0   0 0  Feeling bad or failure about yourself  0   0 0  Trouble concentrating 0   0 0  Moving slowly or fidgety/restless 0   0 0  Suicidal thoughts 0   0 0  PHQ-9 Score 2   0 1  Difficult doing work/chores    Not difficult at all Not difficult at all      08/09/2022    9:15 AM 02/09/2022    9:08 AM  GAD 7 : Generalized Anxiety Score  Nervous, Anxious, on Edge 0 0  Control/stop worrying 0 0  Worry too much - different things 0 0  Trouble relaxing 1 0  Restless 0 0  Easily annoyed or irritable 1 0  Afraid - awful might happen 0 0  Total GAD 7 Score 2 0  Anxiety Difficulty Not difficult at all Not difficult at all   Procedures performed this visit: None.  Return in about 6 months (around 11/04/2023) for mood follow up.  __________________________________ Thayer Ohm, DNP, APRN, FNP-BC Primary Care and [facility] [place]

## 2023-05-05 ENCOUNTER — Telehealth: Payer: Self-pay | Admitting: Cardiology

## 2023-05-05 NOTE — Telephone Encounter (Signed)
Patient wants a call back to confirm what time he needs to be at the hospital on Monday, 5/13.

## 2023-05-05 NOTE — Telephone Encounter (Signed)
Patient spoke to nurse at cath lab and is aware of when he needs to arrive.

## 2023-05-05 NOTE — Pre-Procedure Instructions (Signed)
Instructed patient on the following items: Arrival time 0515 Nothing to eat or drink after midnight No meds AM of procedure Responsible person to drive you home and stay with you for 24 hrs  Have you missed any doses of anti-coagulant Coumadin-takes daily, has not missed any doses.

## 2023-05-07 NOTE — Anesthesia Preprocedure Evaluation (Signed)
Anesthesia Evaluation  Patient identified by MRN, date of birth, ID band Patient awake    Reviewed: Allergy & Precautions, NPO status , Patient's Chart, lab work & pertinent test results, reviewed documented beta blocker date and time   Airway Mallampati: III  TM Distance: >3 FB Neck ROM: Full    Dental  (+) Teeth Intact, Dental Advisory Given   Pulmonary COPD, Current Smoker and Patient abstained from smoking., PE   Pulmonary exam normal breath sounds clear to auscultation       Cardiovascular hypertension, Pt. on home beta blockers and Pt. on medications Normal cardiovascular exam+ dysrhythmias Atrial Fibrillation  Rhythm:Regular Rate:Normal     Neuro/Psych  PSYCHIATRIC DISORDERS Anxiety Depression     Neuromuscular disease    GI/Hepatic negative GI ROS, Neg liver ROS,,,  Endo/Other  negative endocrine ROS    Renal/GU negative Renal ROS     Musculoskeletal  (+) Arthritis ,    Abdominal   Peds  Hematology  (+) Blood dyscrasia (Warfarin)   Anesthesia Other Findings   Reproductive/Obstetrics                             Anesthesia Physical Anesthesia Plan  ASA: 3  Anesthesia Plan: General   Post-op Pain Management: Tylenol PO (pre-op)*   Induction: Intravenous  PONV Risk Score and Plan: 1 and Dexamethasone and Ondansetron  Airway Management Planned: Oral ETT  Additional Equipment:   Intra-op Plan:   Post-operative Plan: Extubation in OR  Informed Consent: I have reviewed the patients History and Physical, chart, labs and discussed the procedure including the risks, benefits and alternatives for the proposed anesthesia with the patient or authorized representative who has indicated his/her understanding and acceptance.     Dental advisory given  Plan Discussed with: CRNA  Anesthesia Plan Comments:        Anesthesia Quick Evaluation

## 2023-05-08 ENCOUNTER — Other Ambulatory Visit: Payer: Self-pay

## 2023-05-08 ENCOUNTER — Ambulatory Visit (HOSPITAL_COMMUNITY): Payer: Medicare Other | Admitting: Anesthesiology

## 2023-05-08 ENCOUNTER — Ambulatory Visit (HOSPITAL_BASED_OUTPATIENT_CLINIC_OR_DEPARTMENT_OTHER): Payer: Medicare Other | Admitting: Anesthesiology

## 2023-05-08 ENCOUNTER — Other Ambulatory Visit (HOSPITAL_COMMUNITY): Payer: Self-pay

## 2023-05-08 ENCOUNTER — Ambulatory Visit (HOSPITAL_COMMUNITY)
Admission: RE | Admit: 2023-05-08 | Discharge: 2023-05-08 | Disposition: A | Discharge status: Home / Self Care | Payer: Medicare Other | Attending: Cardiology | Admitting: Cardiology

## 2023-05-08 ENCOUNTER — Ambulatory Visit (HOSPITAL_COMMUNITY)
Admission: RE | Disposition: A | Discharge status: Home / Self Care | Payer: Self-pay | Source: Home / Self Care | Attending: Cardiology

## 2023-05-08 DIAGNOSIS — J449 Chronic obstructive pulmonary disease, unspecified: Secondary | ICD-10-CM | POA: Diagnosis not present

## 2023-05-08 DIAGNOSIS — I4891 Unspecified atrial fibrillation: Secondary | ICD-10-CM | POA: Diagnosis not present

## 2023-05-08 DIAGNOSIS — F17200 Nicotine dependence, unspecified, uncomplicated: Secondary | ICD-10-CM

## 2023-05-08 DIAGNOSIS — I10 Essential (primary) hypertension: Secondary | ICD-10-CM | POA: Insufficient documentation

## 2023-05-08 DIAGNOSIS — I4819 Other persistent atrial fibrillation: Secondary | ICD-10-CM | POA: Diagnosis not present

## 2023-05-08 DIAGNOSIS — Z79899 Other long term (current) drug therapy: Secondary | ICD-10-CM | POA: Insufficient documentation

## 2023-05-08 DIAGNOSIS — F418 Other specified anxiety disorders: Secondary | ICD-10-CM

## 2023-05-08 DIAGNOSIS — Z7901 Long term (current) use of anticoagulants: Secondary | ICD-10-CM | POA: Insufficient documentation

## 2023-05-08 HISTORY — PX: ATRIAL FIBRILLATION ABLATION: EP1191

## 2023-05-08 LAB — PROTIME-INR
INR: 2.2 — ABNORMAL HIGH (ref 0.8–1.2)
Prothrombin Time: 24.6 seconds — ABNORMAL HIGH (ref 11.4–15.2)

## 2023-05-08 LAB — POCT ACTIVATED CLOTTING TIME
Activated Clotting Time: 309 seconds
Activated Clotting Time: 325 seconds

## 2023-05-08 SURGERY — ATRIAL FIBRILLATION ABLATION
Anesthesia: General

## 2023-05-08 MED ORDER — SODIUM CHLORIDE 0.9 % IV SOLN: INTRAVENOUS | Status: DC

## 2023-05-08 MED ORDER — PANTOPRAZOLE SODIUM 40 MG PO TBEC
40.0000 mg | DELAYED_RELEASE_TABLET | Freq: Every day | ORAL | Status: DC
  Administered 2023-05-08: 40 mg via ORAL
  Filled 2023-05-08: qty 1

## 2023-05-08 MED ORDER — SODIUM CHLORIDE 0.9 % IV SOLN: 250.0000 mL | INTRAVENOUS | Status: DC | PRN

## 2023-05-08 MED ORDER — LIDOCAINE 2% (20 MG/ML) 5 ML SYRINGE
INTRAMUSCULAR | Status: DC | PRN
  Administered 2023-05-08: 80 mg via INTRAVENOUS

## 2023-05-08 MED ORDER — SODIUM CHLORIDE 0.9% FLUSH: 3.0000 mL | Freq: Two times a day (BID) | INTRAVENOUS | Status: DC

## 2023-05-08 MED ORDER — ONDANSETRON HCL 4 MG/2ML IJ SOLN: 4.0000 mg | Freq: Four times a day (QID) | INTRAMUSCULAR | Status: DC | PRN

## 2023-05-08 MED ORDER — HEPARIN SODIUM (PORCINE) 1000 UNIT/ML IJ SOLN
INTRAMUSCULAR | Status: DC | PRN
  Administered 2023-05-08: 10000 [IU] via INTRAVENOUS
  Administered 2023-05-08: 2000 [IU] via INTRAVENOUS

## 2023-05-08 MED ORDER — COLCHICINE 0.6 MG PO TABS
0.6000 mg | ORAL_TABLET | Freq: Two times a day (BID) | ORAL | Status: DC
  Administered 2023-05-08: 0.6 mg via ORAL
  Filled 2023-05-08: qty 1

## 2023-05-08 MED ORDER — PROTAMINE SULFATE 10 MG/ML IV SOLN
INTRAVENOUS | Status: DC | PRN
  Administered 2023-05-08: 30 mg via INTRAVENOUS

## 2023-05-08 MED ORDER — HEPARIN SODIUM (PORCINE) 1000 UNIT/ML IJ SOLN
INTRAMUSCULAR | Status: AC
  Filled 2023-05-08: qty 10

## 2023-05-08 MED ORDER — ONDANSETRON HCL 4 MG/2ML IJ SOLN
INTRAMUSCULAR | Status: DC | PRN
  Administered 2023-05-08: 4 mg via INTRAVENOUS

## 2023-05-08 MED ORDER — SODIUM CHLORIDE 0.9% FLUSH: 3.0000 mL | INTRAVENOUS | Status: DC | PRN

## 2023-05-08 MED ORDER — PROPOFOL 10 MG/ML IV BOLUS
INTRAVENOUS | Status: DC | PRN
  Administered 2023-05-08: 140 mg via INTRAVENOUS

## 2023-05-08 MED ORDER — SUGAMMADEX SODIUM 200 MG/2ML IV SOLN
INTRAVENOUS | Status: DC | PRN
  Administered 2023-05-08: 200 mg via INTRAVENOUS

## 2023-05-08 MED ORDER — HEPARIN (PORCINE) IN NACL 1000-0.9 UT/500ML-% IV SOLN
INTRAVENOUS | Status: DC | PRN
  Administered 2023-05-08 (×3): 500 mL

## 2023-05-08 MED ORDER — ACETAMINOPHEN 500 MG PO TABS
1000.0000 mg | ORAL_TABLET | Freq: Once | ORAL | Status: AC
  Administered 2023-05-08: 1000 mg via ORAL
  Filled 2023-05-08: qty 2

## 2023-05-08 MED ORDER — FENTANYL CITRATE (PF) 100 MCG/2ML IJ SOLN
INTRAMUSCULAR | Status: AC
  Filled 2023-05-08: qty 2

## 2023-05-08 MED ORDER — PANTOPRAZOLE SODIUM 40 MG PO TBEC
40.0000 mg | DELAYED_RELEASE_TABLET | Freq: Every day | ORAL | 0 refills | Status: DC
  Filled 2023-05-08: qty 45, 45d supply, fill #0

## 2023-05-08 MED ORDER — COLCHICINE 0.6 MG PO TABS
0.6000 mg | ORAL_TABLET | Freq: Two times a day (BID) | ORAL | 0 refills | Status: DC
  Filled 2023-05-08: qty 10, 5d supply, fill #0

## 2023-05-08 MED ORDER — FENTANYL CITRATE (PF) 250 MCG/5ML IJ SOLN
INTRAMUSCULAR | Status: DC | PRN
  Administered 2023-05-08: 100 ug via INTRAVENOUS

## 2023-05-08 MED ORDER — ROCURONIUM BROMIDE 10 MG/ML (PF) SYRINGE
PREFILLED_SYRINGE | INTRAVENOUS | Status: DC | PRN
  Administered 2023-05-08: 60 mg via INTRAVENOUS

## 2023-05-08 MED ORDER — HEPARIN SODIUM (PORCINE) 1000 UNIT/ML IJ SOLN
INTRAMUSCULAR | Status: DC | PRN
  Administered 2023-05-08: 1000 [IU] via INTRAVENOUS

## 2023-05-08 MED ORDER — DEXAMETHASONE SODIUM PHOSPHATE 10 MG/ML IJ SOLN
INTRAMUSCULAR | Status: DC | PRN
  Administered 2023-05-08: 10 mg via INTRAVENOUS

## 2023-05-08 MED ORDER — PHENYLEPHRINE HCL-NACL 20-0.9 MG/250ML-% IV SOLN
INTRAVENOUS | Status: DC | PRN
  Administered 2023-05-08: 25 ug/min via INTRAVENOUS

## 2023-05-08 MED ORDER — ACETAMINOPHEN 325 MG PO TABS: 650.0000 mg | ORAL_TABLET | ORAL | Status: DC | PRN

## 2023-05-08 MED ORDER — EPHEDRINE SULFATE-NACL 50-0.9 MG/10ML-% IV SOSY
PREFILLED_SYRINGE | INTRAVENOUS | Status: DC | PRN
  Administered 2023-05-08: 2.5 mg via INTRAVENOUS
  Administered 2023-05-08: 5 mg via INTRAVENOUS
  Administered 2023-05-08: 7.5 mg via INTRAVENOUS
  Administered 2023-05-08: 5 mg via INTRAVENOUS

## 2023-05-08 MED ORDER — PHENYLEPHRINE 80 MCG/ML (10ML) SYRINGE FOR IV PUSH (FOR BLOOD PRESSURE SUPPORT)
PREFILLED_SYRINGE | INTRAVENOUS | Status: DC | PRN
  Administered 2023-05-08: 80 ug via INTRAVENOUS
  Administered 2023-05-08: 120 ug via INTRAVENOUS

## 2023-05-08 SURGICAL SUPPLY — 19 items
BAG SNAP BAND KOVER 36X36 (MISCELLANEOUS) IMPLANT
BLANKET WARM UNDERBOD FULL ACC (MISCELLANEOUS) ×1 IMPLANT
CATH ABLAT QDOT MICRO BI TC DF (CATHETERS) IMPLANT
CATH OCTARAY 2.0 F 3-3-3-3-3 (CATHETERS) IMPLANT
CATH S-M CIRCA TEMP PROBE (CATHETERS) IMPLANT
CATH SOUNDSTAR ECO 8FR (CATHETERS) IMPLANT
CATH WEBSTER BI DIR CS D-F CRV (CATHETERS) IMPLANT
CLOSURE PERCLOSE PROSTYLE (VASCULAR PRODUCTS) IMPLANT
COVER SWIFTLINK CONNECTOR (BAG) ×1 IMPLANT
PACK EP LATEX FREE (CUSTOM PROCEDURE TRAY) ×1
PACK EP LF (CUSTOM PROCEDURE TRAY) ×1 IMPLANT
PAD DEFIB RADIO PHYSIO CONN (PAD) ×1 IMPLANT
PATCH CARTO3 (PAD) IMPLANT
SHEATH 9FR PRELUDE SNAP 13 (SHEATH) IMPLANT
SHEATH BAYLIS TRANSSEPTAL 98CM (NEEDLE) IMPLANT
SHEATH CARTO VIZIGO SM CVD (SHEATH) IMPLANT
SHEATH PINNACLE 8F 10CM (SHEATH) IMPLANT
SHEATH PROBE COVER 6X72 (BAG) IMPLANT
TUBING SMART ABLATE COOLFLOW (TUBING) IMPLANT

## 2023-05-08 NOTE — H&P (Signed)
Electrophysiology Office Follow up Visit Note:     Date:  05/08/2023    ID:  NYRELL KNEELAND, DOB 1946/11/11, MRN [id number]   PCP:  Christen Butter, NP            [facility] Cardiologist:  Meriam Sprague, MD  [facility] Electrophysiologist:  Lanier Prude, MD      Interval History:     Larry Odonnell is a 77 y.o. male who presents for a follow up visit. They were last seen in clinic August 19, 2022.  I saw him in the past for atrial fibrillation and we discussed treatment strategies including antiarrhythmic drugs and catheter ablation.  We planned to touch base about 6 months after that appointment.  He recently met with Robin Searing January 17, 2023.  At that appointment the patient expressed a desire to proceed with catheter ablation.  At the appointment with Alden Server, he was in atrial fibrillation with a rapid ventricular rate.  He reports fatigue while in atrial fibrillation.   Today, his heart rate is elevated in the 150s. Since 01/12/23 when he first noted his recent rapid heart rates, he complains of feeling fatigued, lethargic, occasional blurred vision, and brain fog ("not alert"). He denies being in any kind of pain. He has not needed to take an extra dose of metoprolol; he did not want to change anything prior to today's appointment.   Earlier this month he began walking for exercise; he is trying to get into a routine but is struggling due to his significant fatigue. 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   Reportedly his INR has fluctuated, most recently within the goal range 2 to 3. Currently on warfarin since 04/2019 when he was found to have a PE.   In his family, his mother also had atrial fibrillation.   He did not tolerate amiodarone previously. Also had developed severe leg pains and his atorvastatin was discontinued.   He denies any chest pain, shortness of breath, or peripheral edema. No lightheadedness, headaches,  syncope, orthopnea, or PND.   Presents today for PVI. Procedure reviewed.    Objective      Past Medical History:  Diagnosis Date   A-fib [facility]     Allergy      Latex   Anxiety 2006   Cancer [facility]) 2012    Prostate   Cataract 2017    Surgery both eyes   Depression 2006   DVT (deep venous thrombosis) (HCC)     History of colon polyps      1 adenoma: 6- 2018   Hypertension     Kidney stones 01/08/2020   Mixed hyperlipidemia 08/09/2021   Peripheral neuropathy 08/02/2012   Primary osteoarthritis of left hip 11/23/2021   Pulmonary embolism (HCC)     Pulmonary emphysema (HCC) 11/07/2022   Tobacco dependence 07/02/2019           Past Surgical History:  Procedure Laterality Date   CARDIOVERSION N/A 07/21/2022    Procedure: CARDIOVERSION;  Surgeon: Wendall Stade, MD;  Location: [facility] ENDOSCOPY;  Service: Cardiovascular;  Laterality: N/A;   EYE SURGERY   2017    Cataracts   PROSTATECTOMY       TONSILLECTOMY          Current Medications: Active Medications      Current Meds  Medication Sig   amiodarone (PACERONE) 200 MG tablet Take 1 tablet (200 mg total) by mouth daily.  Take 2 tablets (400 mg) twice daily for 5 days, take 2 tablets (400 mg) once daily for 5 days, then take 200 mg daily thereafter   cholecalciferol (VITAMIN D) 25 MCG (1000 UNIT) tablet Take 1,000 Units by mouth daily.   clonazePAM (KLONOPIN) 0.5 MG tablet Take 1 tablet (0.5 mg total) by mouth daily as needed for anxiety.   erythromycin ophthalmic ointment Place 1 Application into the right eye 3 (three) times daily. X 5-7 days   lisinopril (ZESTRIL) 10 MG tablet Take 1 tablet (10 mg total) by mouth daily.   Melatonin 10 MG TABS Take 10 mg by mouth at bedtime.   metoprolol succinate (TOPROL-XL) 50 MG 24 hr tablet Take 1 tablet (50 mg total) by mouth 2 (two) times daily. Take with or immediately following a meal.   Multiple Vitamin (MULTI-VITAMIN) tablet Take 1 tablet by mouth daily.   Omega-3 1000 MG CAPS Take  1,000 mg by mouth daily.   rosuvastatin (CRESTOR) 10 MG tablet Take 1 tablet (10 mg total) by mouth daily.   warfarin (COUMADIN) 1 MG tablet Take 2 tablets by mouth once daily along with 5mg  tablet or as directed by Coumadin clinic   warfarin (COUMADIN) 5 MG tablet Take 5 mg tablet by mouth once daily along with 1mg  tablet or as directed by the coumadin clinic (Patient taking differently: Take 5 mg by mouth daily at 6 (six) AM. Pt is presently taken 7mg  except for Mon. Wed.and Friday is 5 mg.)   [DISCONTINUED] metoprolol succinate (TOPROL-XL) 50 MG 24 hr tablet Take 1 tablet (50 mg total) by mouth daily. CAN TAKE AN ADDITIONAL HALF TABLET FOR HEART RATE GREATER THAN 100        Allergies:   Atorvastatin, Oxycodone, and Latex    Social History         Socioeconomic History   Marital status: Married      Spouse name: Jiles Prows   Number of children: 1   Years of education: 13   Highest education level: Some college, no degree  Occupational History   Occupation: Retired  Tobacco Use   Smoking status: Every Day      Packs/day: 1.25      Years: 47.00      Total pack years: 58.75      Types: Cigarettes   Smokeless tobacco: Never   Tobacco comments:      Quit 1990-2015  Vaping Use   Vaping Use: Never used  Substance and Sexual Activity   Alcohol use: Not Currently      Comment: No alcohol since 1995   Drug use: Never   Sexual activity: Not Currently      Birth control/protection: None  Other Topics Concern   Not on file  Social History Narrative    Lives with wife. He enjoys yardwork.    Social Determinants of Health        Financial Resource Strain: Low Risk  (08/19/2022)    Overall Financial Resource Strain (CARDIA)     Difficulty of Paying Living Expenses: Not hard at all  Food Insecurity: No Food Insecurity (08/19/2022)    Hunger Vital Sign     Worried About Running Out of Food in the Last Year: Never true     Ran Out of Food in the Last Year: Never true   Transportation Needs: No Transportation Needs (08/19/2022)    PRAPARE - Transportation     Lack of Transportation (Medical): No     Lack of  Transportation (Non-Medical): No  Physical Activity: Unknown (08/19/2022)    Exercise Vital Sign     Days of Exercise per Week: 4 days     Minutes of Exercise per Session: Patient refused  Stress: No Stress Concern Present (08/19/2022)    [employer] of Occupational Health - Occupational Stress Questionnaire     Feeling of Stress : Only a little  Social Connections: Moderately Integrated (08/22/2022)    Social Connection and Isolation Panel [NHANES]     Frequency of Communication with Friends and Family: Once a week     Frequency of Social Gatherings with Friends and Family: Once a week     Attends Religious Services: More than 4 times per year     Active Member of [employer] or Organizations: Yes     Attends Banker Meetings: Patient refused     Marital Status: Married      Family History: The patient's family history includes Alcohol abuse in his father; Anxiety disorder in his brother, daughter, and mother; Cancer in his mother; Depression in his brother and daughter; Early death in his father; Stroke in his mother. There is no history of Esophageal cancer.   ROS:   Please see the history of present illness.    (+) Fatigue/Malaise (+) Blurred vision (+) Brain fog All other systems reviewed and are negative.   EKGs/Labs/Other Studies Reviewed:     The following studies were reviewed today:   Recent Labs: 07/20/2022: BUN 17; Creatinine, Ser 1.07; Hemoglobin 16.3; Platelets 188; Potassium 4.1; Sodium 136    Recent Lipid Panel Labs (Brief)          Component Value Date/Time    CHOL 166 06/01/2021 0915    TRIG 76 06/01/2021 0915    HDL 46 06/01/2021 0915    CHOLHDL 3.6 06/01/2021 0915    LDLCALC 106 (H) 06/01/2021 0915        Physical Exam:     VS:  BP 119/59   Pulse 56   Ht 6\' 1"  (1.854 m)   Wt 194 lb (88 kg)    SpO2 95%   BMI 25.60 kg/m         Wt Readings from Last 3 Encounters:  01/20/23 194 lb (88 kg)  01/17/23 193 lb 12.8 oz (87.9 kg)  11/29/22 192 lb 1.9 oz (87.1 kg)      GEN: Well nourished, well developed in no acute distress CARDIAC: Irregularly irregular, Tachycardic, no murmurs, rubs, gallops RESPIRATORY:  Clear to auscultation without rales, wheezing or rhonchi            Assessment ASSESSMENT:     1. Persistent atrial fibrillation (HCC)   2. Primary hypertension     PLAN:     In order of problems listed above:   #Persistent atrial fibrillation Recently has transition to persistent atrial fibrillation.  He has had difficult to control ventricular rates.  We discussed treatment options again during today's clinic appointment including rate control, antiarrhythmic drugs and catheter ablation.  He would like to proceed with catheter ablation which I think is reasonable given his difficult to control ventricular rates while in atrial fibrillation and symptoms of fatigue while out of rhythm.   He is out of rhythm and conducting fast today. In an effort to give him some immediate relief, I will start him back on amiodarone. Plan for 400mg  PO BID x 5 days followed by 400mg  PO daily x 5 days followed by 200mg  PO daily thereafter. I will  also increase his metoprolol to 50mg  PO BID today. Plan for DCCV in about 3 weeks. I discussed DCCV in detail including risks and he wishes to proceed.    Discussed treatment options today for their AF including antiarrhythmic drug therapy and ablation. Discussed risks, recovery and likelihood of success. Discussed potential need for repeat ablation procedures and antiarrhythmic drugs after an initial ablation. They wish to proceed with scheduling.   Risk, benefits, and alternatives to EP study and radiofrequency ablation for afib were also discussed in detail today. These risks include but are not limited to stroke, bleeding, vascular damage,  tamponade, perforation, damage to the esophagus, lungs, and other structures, pulmonary vein stenosis, worsening renal function, and death. The patient understands these risk and wishes to proceed.  We will therefore proceed with catheter ablation at the next available time.  Carto, ICE, anesthesia are requested for the procedure.  Will also obtain CT PV protocol prior to the procedure to exclude LAA thrombus and further evaluate atrial anatomy.     Presents today for PVI. Procedure reviewed.   Signed, Steffanie Dunn, MD, [facility], [facility] 05/08/2023 Electrophysiology [facility]

## 2023-05-08 NOTE — Anesthesia Procedure Notes (Signed)
Procedure Name: Intubation Date/Time: 05/08/2023 7:42 AM  Performed by: Maxine Glenn, CRNAPre-anesthesia Checklist: Patient identified, Emergency Drugs available, Suction available and Patient being monitored Patient Re-evaluated:Patient Re-evaluated prior to induction Oxygen Delivery Method: Circle System Utilized Preoxygenation: Pre-oxygenation with 100% oxygen Induction Type: IV induction Ventilation: Mask ventilation without difficulty Laryngoscope Size: Mac and 4 Grade View: Grade I Tube type: Oral Tube size: 7.5 mm Number of attempts: 1 Airway Equipment and Method: Stylet Placement Confirmation: ETT inserted through vocal cords under direct vision, positive ETCO2 and breath sounds checked- equal and bilateral Secured at: 23 cm Tube secured with: Tape Dental Injury: Teeth and Oropharynx as per pre-operative assessment

## 2023-05-08 NOTE — Transfer of Care (Signed)
Immediate Anesthesia Transfer of Care Note  Patient: Larry Odonnell  Procedure(s) Performed: ATRIAL FIBRILLATION ABLATION  Patient Location: Cath Lab  Anesthesia Type:General  Level of Consciousness: awake and alert   Airway & Oxygen Therapy: Patient Spontanous Breathing and Patient connected to nasal cannula oxygen  Post-op Assessment: Report given to RN and Post -op Vital signs reviewed and stable  Post vital signs: Reviewed and stable  Last Vitals:  Vitals Value Taken Time  BP 126/49 05/08/23 0932  Temp    Pulse 58 05/08/23 0935  Resp 14 05/08/23 0935  SpO2 98 % 05/08/23 0935  Vitals shown include unvalidated device data.  Last Pain:  Vitals:   05/08/23 0541  TempSrc:   PainSc: 0-No pain         Complications: There were no known notable events for this encounter.

## 2023-05-08 NOTE — Anesthesia Postprocedure Evaluation (Signed)
Anesthesia Post Note  Patient: Larry Odonnell  Procedure(s) Performed: ATRIAL FIBRILLATION ABLATION     Patient location during evaluation: PACU Anesthesia Type: General Level of consciousness: awake and alert Pain management: pain level controlled Vital Signs Assessment: post-procedure vital signs reviewed and stable Respiratory status: spontaneous breathing, nonlabored ventilation, respiratory function stable and patient connected to nasal cannula oxygen Cardiovascular status: blood pressure returned to baseline and stable Postop Assessment: no apparent nausea or vomiting Anesthetic complications: no   There were no known notable events for this encounter.  Last Vitals:  Vitals:   05/08/23 1055 05/08/23 1100  BP:  118/61  Pulse: (!) 53 (!) 54  Resp: 11 17  Temp:    SpO2: 97% 97%    Last Pain:  Vitals:   05/08/23 1028  TempSrc:   PainSc: 0-No pain                 Collene Schlichter

## 2023-05-08 NOTE — Discharge Instructions (Signed)

## 2023-05-09 ENCOUNTER — Encounter (HOSPITAL_COMMUNITY): Payer: Self-pay | Admitting: Cardiology

## 2023-05-09 MED FILL — Fentanyl Citrate Preservative Free (PF) Inj 100 MCG/2ML: INTRAMUSCULAR | Qty: 2 | Status: AC

## 2023-05-11 ENCOUNTER — Other Ambulatory Visit (HOSPITAL_COMMUNITY): Payer: Self-pay

## 2023-05-12 ENCOUNTER — Ambulatory Visit: Payer: Medicare Other | Attending: Cardiology

## 2023-05-12 DIAGNOSIS — I824Y9 Acute embolism and thrombosis of unspecified deep veins of unspecified proximal lower extremity: Secondary | ICD-10-CM

## 2023-05-12 DIAGNOSIS — Z5181 Encounter for therapeutic drug level monitoring: Secondary | ICD-10-CM

## 2023-05-12 DIAGNOSIS — I4891 Unspecified atrial fibrillation: Secondary | ICD-10-CM | POA: Diagnosis not present

## 2023-05-12 LAB — POCT INR: INR: 2.8 (ref 2.0–3.0)

## 2023-05-12 NOTE — Patient Instructions (Signed)
Description   Continue on same dosage of Warfarin 5mg  daily except 2.5mg  on Tuesdays.   Recheck INR in 3 weeks.  Pt started amio on 1/26 Anticoagulation Clinic [phone] Cardiac Clearance needed prior to procedure.

## 2023-06-05 ENCOUNTER — Encounter (HOSPITAL_COMMUNITY): Payer: Self-pay | Admitting: Internal Medicine

## 2023-06-05 ENCOUNTER — Other Ambulatory Visit: Payer: Self-pay | Admitting: Cardiology

## 2023-06-05 ENCOUNTER — Ambulatory Visit (INDEPENDENT_AMBULATORY_CARE_PROVIDER_SITE_OTHER): Payer: Medicare Other | Admitting: *Deleted

## 2023-06-05 ENCOUNTER — Ambulatory Visit
Admission: RE | Admit: 2023-06-05 | Discharge: 2023-06-05 | Disposition: A | Discharge status: Home / Self Care | Payer: Medicare Other | Source: Ambulatory Visit | Attending: Internal Medicine | Admitting: Internal Medicine

## 2023-06-05 VITALS — BP 146/82 | HR 63 | Ht 73.0 in | Wt 206.6 lb

## 2023-06-05 DIAGNOSIS — I4819 Other persistent atrial fibrillation: Secondary | ICD-10-CM | POA: Insufficient documentation

## 2023-06-05 DIAGNOSIS — Z86711 Personal history of pulmonary embolism: Secondary | ICD-10-CM | POA: Diagnosis not present

## 2023-06-05 DIAGNOSIS — I824Y9 Acute embolism and thrombosis of unspecified deep veins of unspecified proximal lower extremity: Secondary | ICD-10-CM

## 2023-06-05 DIAGNOSIS — E785 Hyperlipidemia, unspecified: Secondary | ICD-10-CM | POA: Insufficient documentation

## 2023-06-05 DIAGNOSIS — Z7901 Long term (current) use of anticoagulants: Secondary | ICD-10-CM | POA: Insufficient documentation

## 2023-06-05 DIAGNOSIS — I10 Essential (primary) hypertension: Secondary | ICD-10-CM | POA: Diagnosis not present

## 2023-06-05 DIAGNOSIS — Z5181 Encounter for therapeutic drug level monitoring: Secondary | ICD-10-CM | POA: Insufficient documentation

## 2023-06-05 DIAGNOSIS — D6869 Other thrombophilia: Secondary | ICD-10-CM | POA: Diagnosis not present

## 2023-06-05 DIAGNOSIS — I4891 Unspecified atrial fibrillation: Secondary | ICD-10-CM

## 2023-06-05 DIAGNOSIS — I2693 Single subsegmental pulmonary embolism without acute cor pulmonale: Secondary | ICD-10-CM

## 2023-06-05 LAB — POCT INR: INR: 2.7 (ref 2.0–3.0)

## 2023-06-05 MED ORDER — METOPROLOL SUCCINATE ER 50 MG PO TB24: 25.0000 mg | ORAL_TABLET | Freq: Two times a day (BID) | ORAL | 3 refills | Status: DC

## 2023-06-05 NOTE — Patient Instructions (Addendum)
Description   Continue taking Warfarin 5mg  daily except 2.5mg  on Tuesdays.   Recheck INR in 4 weeks.  Pt started amio on 1/26 Anticoagulation Clinic [phone]

## 2023-06-05 NOTE — Patient Instructions (Signed)
Decrease metoprolol to 25mg  twice a day (1/2 of your 50mg  tablet twice a day)

## 2023-06-05 NOTE — Progress Notes (Signed)
Primary Care Physician: Christen Butter, NP Primary Cardiologist: Dr. Shari Prows Primary Electrophysiologist: Dr. Lalla Brothers Referring Physician: Dr. Despina Pole is a 77 y.o. male with a history of DVT, pulmonary embolism, HLD, HTN, prostate cancer, and persistent atrial fibrillation who presents for consultation in the [facility]. Seen by Dr. Lalla Brothers on 01/20/23 and was found to be in Afib with RVR; restarted on amiodarone load and underwent successful DCCV on 02/23/23 with conversion to NSR. He is s/p Afib ablation by Dr. Lalla Brothers on 05/08/23. Currently taking amiodarone 200 mg daily. Patient is on Coumadin for a CHADS2VASC score of 3.  On follow up today, pt is currently in NSR. He is s/p Afib ablation by Dr. Lalla Brothers on 05/08/23. He has not had any episodes of Afib since ablation. No chest pain, shortness of breath, or trouble swallowing. Leg sites healed without issue. He reports overall feeling poorly since ablation. He will have intermittent dizziness on some days. He notes to feel weakness in his legs and have no energy. He stopped protonix on his own last week because he thought it would help his symptoms.    He is compliant with anticoagulation and has not missed any doses. He has no bleeding concerns.  Today, patient denies symptoms of palpitations, orthopnea, PND, lower extremity edema, dizziness, presyncope, syncope, snoring, daytime somnolence, bleeding, or neurologic sequela. The patient is tolerating medications without difficulties and is otherwise without complaint today.    he has a BMI of Body mass index is 27.26 kg/m.Marland Kitchen Filed Weights   06/05/23 1331  Weight: 93.7 kg    Family History  Problem Relation Age of Onset   Anxiety disorder Mother    Cancer Mother    Stroke Mother    Alcohol abuse Father    Early death Father    Anxiety disorder Brother    Depression Brother    Anxiety disorder Daughter    Depression Daughter     Esophageal cancer Neg Hx      Atrial Fibrillation Management history:  Previous antiarrhythmic drugs: amiodarone Previous cardioversions: 07/21/2022, 02/23/23 Previous ablations: 05/08/23 Anticoagulation history: Coumadin   Past Medical History:  Diagnosis Date   A-fib [facility])    Allergy    Latex   Anxiety 2006   Cancer [facility]) 2012   Prostate   Cataract 2017   Surgery both eyes   Depression 2006   DVT (deep venous thrombosis) (HCC)    Encounter for monitoring warfarin therapy 07/22/2019   History of colon polyps    1 adenoma: 6- 2018   Hypertension    Kidney stones 01/08/2020   Mixed hyperlipidemia 08/09/2021   Peripheral neuropathy 08/02/2012   Primary osteoarthritis of left hip 11/23/2021   Pulmonary embolism (HCC)    Pulmonary emphysema (HCC) 11/07/2022   Pulmonary emphysema (HCC) 11/07/2022   Tobacco dependence 07/02/2019   Past Surgical History:  Procedure Laterality Date   ATRIAL FIBRILLATION ABLATION N/A 05/08/2023   Procedure: ATRIAL FIBRILLATION ABLATION;  Surgeon: Lanier Prude, MD;  Location: [facility] INVASIVE CV LAB;  Service: Cardiovascular;  Laterality: N/A;   CARDIOVERSION N/A 07/21/2022   Procedure: CARDIOVERSION;  Surgeon: Wendall Stade, MD;  Location: [facility] ENDOSCOPY;  Service: Cardiovascular;  Laterality: N/A;   CARDIOVERSION N/A 02/23/2023   Procedure: CARDIOVERSION;  Surgeon: Wendall Stade, MD;  Location: [facility] ENDOSCOPY;  Service: Cardiovascular;  Laterality: N/A;   EYE SURGERY  2017   Cataracts   PROSTATECTOMY     TEE WITHOUT  CARDIOVERSION N/A 02/23/2023   Procedure: TRANSESOPHAGEAL ECHOCARDIOGRAM (TEE);  Surgeon: Wendall Stade, MD;  Location: [facility] ENDOSCOPY;  Service: Cardiovascular;  Laterality: N/A;   TONSILLECTOMY      Current Outpatient Medications  Medication Sig Dispense Refill   amiodarone (PACERONE) 200 MG tablet Take 1 tablet (200 mg total) by mouth daily. Take 2 tablets (400 mg) twice daily for 5 days, take 2 tablets (400 mg) once daily for  5 days, then take 200 mg daily thereafter (Patient taking differently: Take 200 mg by mouth daily.) 90 tablet 3   cholecalciferol (VITAMIN D) 25 MCG (1000 UNIT) tablet Take 1,000 Units by mouth daily.     clonazePAM (KLONOPIN) 0.5 MG tablet Take 1 tablet (0.5 mg total) by mouth daily as needed for anxiety. 30 tablet 2   lisinopril (ZESTRIL) 10 MG tablet Take 1 tablet (10 mg total) by mouth daily. 90 tablet 3   Melatonin 10 MG TABS Take 10 mg by mouth at bedtime.     Multiple Vitamin (MULTI-VITAMIN) tablet Take 1 tablet by mouth daily.     rosuvastatin (CRESTOR) 10 MG tablet Take 1 tablet (10 mg total) by mouth daily. 90 tablet 3   warfarin (COUMADIN) 1 MG tablet Take 2 tablets by mouth once daily along with 5mg  tablet or as directed by Coumadin clinic 180 tablet 1   warfarin (COUMADIN) 5 MG tablet Take 5 mg tablet by mouth once daily along with 1mg  tablet or as directed by the coumadin clinic (Patient taking differently: Take 2.5-5 mg by mouth See admin instructions. Take 2.5 mg Tuesday, all the other day take 5 mg daily) 100 tablet 1   amitriptyline (ELAVIL) 50 MG tablet Take 1 tablet (50 mg total) by mouth at bedtime. 90 tablet 1   colchicine 0.6 MG tablet Take 1 tablet (0.6 mg total) by mouth 2 (two) times daily for 5 days. 10 tablet 0   metoprolol succinate (TOPROL-XL) 50 MG 24 hr tablet Take 0.5 tablets (25 mg total) by mouth 2 (two) times daily. Take with or immediately following a meal. 180 tablet 3   Omega-3 1000 MG CAPS Take 1,000 mg by mouth daily.     pantoprazole (PROTONIX) 40 MG tablet Take 1 tablet (40 mg total) by mouth daily. 45 tablet 0   No current facility-administered medications for this encounter.    Allergies  Allergen Reactions   Atorvastatin Other (See Comments)    Pt reports causes leg pains and leg heaviness   Oxycodone Other (See Comments)    Causes extreme dizziness and falling down   Latex Rash    Social History   Socioeconomic History   Marital status:  Married    Spouse name: Jiles Prows   Number of children: 1   Years of education: 13   Highest education level: Some college, no degree  Occupational History   Occupation: Retired  Tobacco Use   Smoking status: Every Day    Packs/day: 1.25    Years: 47.00    Additional pack years: 0.00    Total pack years: 58.75    Types: Cigarettes   Smokeless tobacco: Never   Tobacco comments:    Quit 1990-2015  Vaping Use   Vaping Use: Never used  Substance and Sexual Activity   Alcohol use: Not Currently    Comment: No alcohol since 1995   Drug use: Never   Sexual activity: Not Currently    Birth control/protection: None  Other Topics Concern   Not on  file  Social History Narrative   Lives with wife. He enjoys yardwork.   Social Determinants of Health   Financial Resource Strain: Low Risk  (03/19/2023)   Overall Financial Resource Strain (CARDIA)    Difficulty of Paying Living Expenses: Not hard at all  Food Insecurity: No Food Insecurity (03/19/2023)   Hunger Vital Sign    Worried About Running Out of Food in the Last Year: Never true    Ran Out of Food in the Last Year: Never true  Transportation Needs: No Transportation Needs (03/19/2023)   PRAPARE - Administrator, Civil Service (Medical): No    Lack of Transportation (Non-Medical): No  Physical Activity: Patient Declined (03/19/2023)   Exercise Vital Sign    Days of Exercise per Week: Patient declined    Minutes of Exercise per Session: Patient declined  Stress: Stress Concern Present (03/19/2023)   [employer] of Occupational Health - Occupational Stress Questionnaire    Feeling of Stress : To some extent  Social Connections: Moderately Isolated (03/19/2023)   Social Connection and Isolation Panel [NHANES]    Frequency of Communication with Friends and Family: Once a week    Frequency of Social Gatherings with Friends and Family: Once a week    Attends Religious Services: More than 4 times per year     Active Member of [employer] or Organizations: No    Attends Banker Meetings: Patient declined    Marital Status: Married  Catering manager Violence: Not At Risk (08/22/2022)   Humiliation, Afraid, Rape, and Kick questionnaire    Fear of Current or Ex-Partner: No    Emotionally Abused: No    Physically Abused: No    Sexually Abused: No    ROS- All systems are reviewed and negative except as per the HPI above.  Physical Exam: Vitals:   06/05/23 1331  BP: (!) 146/82  Pulse: 63  Weight: 93.7 kg  Height: 6\' 1"  (1.854 m)    GEN- The patient is a well appearing male, alert and oriented x 3 today.   Head- normocephalic, atraumatic Eyes-  Sclera clear, conjunctiva pink Ears- hearing intact Oropharynx- clear Neck- supple  Lungs- Clear to ausculation bilaterally, normal work of breathing Heart- Regular rate and rhythm, no murmurs, rubs or gallops  GI- soft, NT, ND, + BS Extremities- no clubbing, cyanosis, or edema MS- no significant deformity or atrophy Skin- no rash or lesion Psych- euthymic mood, full affect Neuro- strength and sensation are intact  Wt Readings from Last 3 Encounters:  06/05/23 93.7 kg  05/08/23 90.7 kg  05/04/23 92.3 kg    EKG today demonstrates  Vent. rate 63 BPM PR interval 178 ms QRS duration 86 ms QT/QTcB 406/415 ms P-R-T axes 65 23 52 Normal sinus rhythm Cannot rule out Anterior infarct , age undetermined Abnormal ECG When compared with ECG of 08-May-2023 09:56, PREVIOUS ECG IS PRESENT  Echo 03/25/21 demonstrated:  1. Left ventricular ejection fraction, by estimation, is 55 to 60%. Left  ventricular ejection fraction by 3D volume is 58 %. The left ventricle has  normal function. The left ventricle has no regional wall motion  abnormalities. Left ventricular diastolic   parameters were normal.   2. Right ventricular systolic function is normal. The right ventricular  size is normal. There is normal pulmonary artery systolic pressure.  The  estimated right ventricular systolic pressure is 12.9 mmHg.   3. The mitral valve is grossly normal. Trivial mitral valve  regurgitation. No evidence of  mitral stenosis.   4. The aortic valve is tricuspid. Aortic valve regurgitation is not  visualized. No aortic stenosis is present.   5. The inferior vena cava is normal in size with greater than 50%  respiratory variability, suggesting right atrial pressure of 3 mmHg.   Epic records are reviewed at length today.  CHA2DS2-VASc Score = 4  The patient's score is based upon: CHF History: 0 HTN History: 1 Diabetes History: 0 Stroke History: 0 Vascular Disease History: 1 Age Score: 2 Gender Score: 0       ASSESSMENT AND PLAN: Persistent Atrial Fibrillation (ICD10:  I48.19) The patient's CHA2DS2-VASc score is 4, indicating a 4.8% annual risk of stroke.   S/p Afib ablation by Dr. Lalla Brothers on 05/08/23.   He is currently in NSR.  He states he feels poorly since ablation and is unhappy about current quality of life. He states he will self-medicate / adjust medications on his own in order to try and feel better. I recommended that we start decreasing specific medications in a step-wise fashion. We discussed lowering his metoprolol and if needed could consider decreasing amiodarone. I advised he decrease his metoprolol first and begin taking Toprol 50 mg half tablet (25 mg) BID. Monitor BP/HR for a few days and call us back to lower as necessary. He agrees to this and will call in a few days.   Secondary Hypercoagulable State (ICD10:  D68.69) The patient is at significant risk for stroke/thromboembolism based upon his CHA2DS2-VASc Score of 4.  Continue Warfarin (Coumadin).  Continue as directed.  Follow up as scheduled with Dr. Lalla Brothers.    Lake Bells, PA-C Afib Clinic [facility] [address] [place], [place] [address] [phone] 06/05/2023 3:20 PM

## 2023-06-12 ENCOUNTER — Encounter: Payer: Self-pay | Admitting: Medical-Surgical

## 2023-06-12 DIAGNOSIS — F418 Other specified anxiety disorders: Secondary | ICD-10-CM

## 2023-06-12 MED ORDER — CLONAZEPAM 0.5 MG PO TABS
0.5000 mg | ORAL_TABLET | Freq: Every day | ORAL | 2 refills | Status: DC | PRN
Start: 2023-06-12 — End: 2024-02-26

## 2023-06-16 ENCOUNTER — Other Ambulatory Visit: Payer: Self-pay | Admitting: Medical-Surgical

## 2023-06-19 ENCOUNTER — Other Ambulatory Visit: Payer: Self-pay | Admitting: *Deleted

## 2023-06-19 MED ORDER — LISINOPRIL 10 MG PO TABS: 10.0000 mg | ORAL_TABLET | Freq: Every day | ORAL | 2 refills | Status: DC

## 2023-06-19 MED ORDER — ROSUVASTATIN CALCIUM 10 MG PO TABS: 10.0000 mg | ORAL_TABLET | Freq: Every day | ORAL | 2 refills | Status: DC

## 2023-06-20 ENCOUNTER — Other Ambulatory Visit: Payer: Self-pay | Admitting: Cardiology

## 2023-06-20 DIAGNOSIS — I4891 Unspecified atrial fibrillation: Secondary | ICD-10-CM

## 2023-06-21 NOTE — Telephone Encounter (Signed)
Prescription refill request received for warfarin Lov: 06/05/23 Nelva Bush) Next INR check: 07/03/23 Warfarin tablet strength: 5mg   Appropriate dose. Refill sent.

## 2023-07-03 ENCOUNTER — Ambulatory Visit: Payer: Medicare Other | Admitting: *Deleted

## 2023-07-03 DIAGNOSIS — I824Y9 Acute embolism and thrombosis of unspecified deep veins of unspecified proximal lower extremity: Secondary | ICD-10-CM

## 2023-07-03 DIAGNOSIS — I4891 Unspecified atrial fibrillation: Secondary | ICD-10-CM | POA: Diagnosis not present

## 2023-07-03 DIAGNOSIS — I2693 Single subsegmental pulmonary embolism without acute cor pulmonale: Secondary | ICD-10-CM

## 2023-07-03 DIAGNOSIS — Z5181 Encounter for therapeutic drug level monitoring: Secondary | ICD-10-CM

## 2023-07-03 LAB — POCT INR: INR: 3.1 — AB (ref 2.0–3.0)

## 2023-07-03 NOTE — Patient Instructions (Signed)
Description   Today take 2.5mg  of warfarin today then continue taking Warfarin 5mg  daily except 2.5mg  on Tuesdays.   Recheck INR in 4 weeks.  Pt started amio on 1/26 Anticoagulation Clinic [phone]

## 2023-07-27 ENCOUNTER — Other Ambulatory Visit: Payer: Self-pay

## 2023-07-27 NOTE — Telephone Encounter (Signed)
This is a A-Fib clinic pt 

## 2023-07-28 ENCOUNTER — Telehealth: Payer: Self-pay | Admitting: Cardiology

## 2023-07-28 MED ORDER — METOPROLOL SUCCINATE ER 50 MG PO TB24: 25.0000 mg | ORAL_TABLET | Freq: Two times a day (BID) | ORAL | Status: DC

## 2023-07-28 NOTE — Telephone Encounter (Signed)
  Pt c/o medication issue:  1. Name of Medication: amiodarone (PACERONE) 200 MG tablet   2. How are you currently taking this medication (dosage and times per day)? As written   3. Are you having a reaction (difficulty breathing--STAT)? No   4. What is your medication issue?  The patient reported that since his ablation on May 08, 2023, he has been feeling weak and lethargic. He mentioned that whenever he tries to work in his yard, he has to stop and sit down after 15 minutes due to dizziness. He expressed concern that his symptoms might be caused by amiodarone and inquired whether he should see Dr. Lalla Brothers sooner than his scheduled appointment on September 13, 2023

## 2023-07-28 NOTE — Telephone Encounter (Signed)
I spoke with patient.  He reports since ablation he has steadily been getting weaker.  Lopressor was decreased at office visit in June.  He is not feeling better since decrease in lopressor.  Patient reports dizziness and feeling like his knees will buckle when walking.  Improves after sitting.  No afib episodes. Heart rate and BP are normal per patient. He is asking if any medication changes are needed or if he needs sooner appointment than currently scheduled with Dr Lalla Brothers on 9/18.

## 2023-07-31 ENCOUNTER — Ambulatory Visit: Payer: Medicare Other | Attending: Cardiology

## 2023-07-31 DIAGNOSIS — I824Y9 Acute embolism and thrombosis of unspecified deep veins of unspecified proximal lower extremity: Secondary | ICD-10-CM

## 2023-07-31 DIAGNOSIS — Z5181 Encounter for therapeutic drug level monitoring: Secondary | ICD-10-CM | POA: Diagnosis not present

## 2023-07-31 DIAGNOSIS — I4891 Unspecified atrial fibrillation: Secondary | ICD-10-CM | POA: Diagnosis not present

## 2023-07-31 LAB — POCT INR: INR: 3.9 — AB (ref 2.0–3.0)

## 2023-07-31 NOTE — Patient Instructions (Signed)
HOLD TODAY ONLY THEN  then continue taking Warfarin 5mg  daily except 2.5mg  on Tuesdays.   Recheck INR in 4 weeks.  Pt started amio on 1/26 Anticoagulation Clinic [phone]

## 2023-07-31 NOTE — Telephone Encounter (Signed)
Spoke with the patient and advised him that he could stop amiodarone. Patient verbalized understanding.

## 2023-08-22 ENCOUNTER — Ambulatory Visit: Payer: Medicare Other | Admitting: Medical-Surgical

## 2023-08-22 ENCOUNTER — Encounter: Payer: Self-pay | Admitting: Medical-Surgical

## 2023-08-22 VITALS — BP 130/77 | HR 69 | Ht 73.0 in | Wt 209.0 lb

## 2023-08-22 DIAGNOSIS — R55 Syncope and collapse: Secondary | ICD-10-CM

## 2023-08-22 DIAGNOSIS — R5383 Other fatigue: Secondary | ICD-10-CM | POA: Diagnosis not present

## 2023-08-22 DIAGNOSIS — W19XXXA Unspecified fall, initial encounter: Secondary | ICD-10-CM

## 2023-08-22 DIAGNOSIS — F418 Other specified anxiety disorders: Secondary | ICD-10-CM | POA: Diagnosis not present

## 2023-08-22 DIAGNOSIS — D649 Anemia, unspecified: Secondary | ICD-10-CM

## 2023-08-22 DIAGNOSIS — R42 Dizziness and giddiness: Secondary | ICD-10-CM | POA: Diagnosis not present

## 2023-08-22 MED ORDER — AMITRIPTYLINE HCL 50 MG PO TABS: 50.0000 mg | ORAL_TABLET | Freq: Every day | ORAL | 1 refills | Status: DC

## 2023-08-22 NOTE — Progress Notes (Unsigned)
        Established patient visit  History, exam, impression, and plan:  1. Postural dizziness with presyncope 2. Fatigue, unspecified type 3. Fall, initial encounter Larry Odonnell 77 year old male presenting today with reports of intermittent dizziness.  Notes that he had a cardiac ablation for atrial fibrillation back in May of this year.  Since then he has had bouts of dizziness that are fairly short-lived however happen without provocation.  These bouts of dizziness seem to happen when he is up and walking around.  When he sits, the dizziness fully resolves.  Had vertigo in the past but reports this dizziness is completely different.  Over the last week or 2, has had a couple of falls when he was outside due to the dizziness.  Both times, he felt dizzy then his knees gave way and he fell to the ground.  He denies losing consciousness and was aware of the fall when it was happening but felt like his body was not responding.  Denies other concerning neurological symptoms.  No chest pain, shortness of breath, headaches, lower extremity edema, orthopnea, PND, or vision changes.  Has been experiencing significant fatigue and lethargy that worsened after his ablation.  Difficulty tolerating regular activities and has to take frequent rest breaks before getting started again.  On exam, no concerning findings to indicate a possible cause.  Plan for workup as noted below with labs, carotid ultrasound, and MRI brain to evaluate for carotid stenosis, TIA, or other possible neurological cause.  In office EKG completed today showing normal sinus rhythm, rate 67, normal axis.  Echocardiogram reviewed.  We did briefly review the risk of dizziness/falls related to medication side effects however he has not had any new medications and the symptoms only presented after the ablation procedure.  Plan for close follow-up with cardiology. - CBC with Differential/Platelet - TSH - Testosterone - Magnesium - Iron, TIBC and  Ferritin Panel - CMP14+EGFR - US Carotid Duplex Bilateral; Future - EKG 12-Lead - MR Brain Wo Contrast; Future  4. Anxiety with depression Has been taking amitriptyline 50 mg nightly, tolerating well without side effects.  Does not feel this is related to his dizziness and falls as it has been well-tolerated in the past.  Unfortunately, had a traumatic experience recently when one of his close friends for the last 40+ years passed away.  He went to visit her and she took her last breath as he was holding her hand.  This has been very difficult for him and although he is managing, he increased his amitriptyline dose to 50 mg twice daily on his own.  Has been tolerating this very well and has not noticed a worsening of fatigue and lethargy with the increased dose.  During this time of stress, he would like to continue the twice daily dosing.  We did review recommendations for treatment with amitriptyline in the older population and the risk for contribution to falls/injury.  He is aware of the risks and would still like to proceed with twice daily dosing.  New prescription sent to the pharmacy.  Plan to reevaluate in a couple of months to see if he is at a good place to be able to dial back to bedtime dosing alone.  Procedures performed this visit: None.  Return for Mood/dizziness follow-up in 4-6 weeks.  __________________________________ Thayer Ohm, DNP, APRN, FNP-BC Primary Care and Sports Medicine [facility]

## 2023-08-23 LAB — CBC WITH DIFFERENTIAL/PLATELET
Basophils Absolute: 0.1 10*3/uL (ref 0.0–0.2)
Basos: 1 %
EOS (ABSOLUTE): 0.2 10*3/uL (ref 0.0–0.4)
Eos: 3 %
Hematocrit: 39.9 % (ref 37.5–51.0)
Hemoglobin: 12.9 g/dL — ABNORMAL LOW (ref 13.0–17.7)
Immature Grans (Abs): 0.1 10*3/uL (ref 0.0–0.1)
Immature Granulocytes: 1 %
Lymphocytes Absolute: 2.1 10*3/uL (ref 0.7–3.1)
Lymphs: 25 %
MCH: 30.4 pg (ref 26.6–33.0)
MCHC: 32.3 g/dL (ref 31.5–35.7)
MCV: 94 fL (ref 79–97)
Monocytes Absolute: 0.9 10*3/uL (ref 0.1–0.9)
Monocytes: 11 %
Neutrophils Absolute: 5 10*3/uL (ref 1.4–7.0)
Neutrophils: 59 %
Platelets: 180 10*3/uL (ref 150–450)
RBC: 4.24 x10E6/uL (ref 4.14–5.80)
RDW: 13.1 % (ref 11.6–15.4)
WBC: 8.4 10*3/uL (ref 3.4–10.8)

## 2023-08-23 LAB — IRON,TIBC AND FERRITIN PANEL
Ferritin: 100 ng/mL (ref 30–400)
Iron Saturation: 32 % (ref 15–55)
Iron: 99 ug/dL (ref 38–169)
Total Iron Binding Capacity: 312 ug/dL (ref 250–450)
UIBC: 213 ug/dL (ref 111–343)

## 2023-08-23 LAB — CMP14+EGFR
ALT: 27 IU/L (ref 0–44)
AST: 28 IU/L (ref 0–40)
Albumin: 4.3 g/dL (ref 3.8–4.8)
Alkaline Phosphatase: 68 IU/L (ref 44–121)
BUN/Creatinine Ratio: 15 (ref 10–24)
BUN: 17 mg/dL (ref 8–27)
Bilirubin Total: 0.2 mg/dL (ref 0.0–1.2)
CO2: 20 mmol/L (ref 20–29)
Calcium: 9.3 mg/dL (ref 8.6–10.2)
Chloride: 105 mmol/L (ref 96–106)
Creatinine, Ser: 1.15 mg/dL (ref 0.76–1.27)
Globulin, Total: 2.7 g/dL (ref 1.5–4.5)
Glucose: 99 mg/dL (ref 70–99)
Potassium: 4.2 mmol/L (ref 3.5–5.2)
Sodium: 139 mmol/L (ref 134–144)
Total Protein: 7 g/dL (ref 6.0–8.5)
eGFR: 66 mL/min/{1.73_m2} (ref >59)

## 2023-08-23 LAB — TESTOSTERONE: Testosterone: 386 ng/dL (ref 264–916)

## 2023-08-23 LAB — TSH: TSH: 1.52 u[IU]/mL (ref 0.450–4.500)

## 2023-08-23 LAB — MAGNESIUM: Magnesium: 2.2 mg/dL (ref 1.6–2.3)

## 2023-08-23 NOTE — Addendum Note (Signed)
Addended byChristen Butter on: 08/23/2023 06:17 PM   Modules accepted: Orders

## 2023-08-26 ENCOUNTER — Emergency Department (HOSPITAL_COMMUNITY): Payer: Medicare Other

## 2023-08-26 ENCOUNTER — Other Ambulatory Visit: Payer: Self-pay

## 2023-08-26 ENCOUNTER — Encounter (HOSPITAL_COMMUNITY): Payer: Self-pay

## 2023-08-26 ENCOUNTER — Emergency Department (HOSPITAL_COMMUNITY)
Admission: EM | Admit: 2023-08-26 | Discharge: 2023-08-26 | Disposition: A | Discharge status: Home / Self Care | Payer: Medicare Other | Attending: Emergency Medicine | Admitting: Emergency Medicine

## 2023-08-26 DIAGNOSIS — Z91040 Latex allergy status: Secondary | ICD-10-CM | POA: Insufficient documentation

## 2023-08-26 DIAGNOSIS — Z79899 Other long term (current) drug therapy: Secondary | ICD-10-CM | POA: Insufficient documentation

## 2023-08-26 DIAGNOSIS — R0789 Other chest pain: Secondary | ICD-10-CM | POA: Diagnosis not present

## 2023-08-26 DIAGNOSIS — Z1152 Encounter for screening for COVID-19: Secondary | ICD-10-CM | POA: Diagnosis not present

## 2023-08-26 DIAGNOSIS — I4891 Unspecified atrial fibrillation: Secondary | ICD-10-CM | POA: Diagnosis not present

## 2023-08-26 LAB — CBC
HCT: 39.2 % (ref 39.0–52.0)
Hemoglobin: 13.1 g/dL (ref 13.0–17.0)
MCH: 32.2 pg (ref 26.0–34.0)
MCHC: 33.4 g/dL (ref 30.0–36.0)
MCV: 96.3 fL (ref 80.0–100.0)
Platelets: 175 10*3/uL (ref 150–400)
RBC: 4.07 MIL/uL — ABNORMAL LOW (ref 4.22–5.81)
RDW: 13.5 % (ref 11.5–15.5)
WBC: 7.3 10*3/uL (ref 4.0–10.5)
nRBC: 0 % (ref 0.0–0.2)

## 2023-08-26 LAB — PROTIME-INR
INR: 2.3 — ABNORMAL HIGH (ref 0.8–1.2)
Prothrombin Time: 25.2 seconds — ABNORMAL HIGH (ref 11.4–15.2)

## 2023-08-26 LAB — BASIC METABOLIC PANEL
Anion gap: 10 (ref 5–15)
BUN: 20 mg/dL (ref 8–23)
CO2: 23 mmol/L (ref 22–32)
Calcium: 9 mg/dL (ref 8.9–10.3)
Chloride: 104 mmol/L (ref 98–111)
Creatinine, Ser: 1.1 mg/dL (ref 0.61–1.24)
GFR, Estimated: >60 mL/min (ref >60)
Glucose, Bld: 101 mg/dL — ABNORMAL HIGH (ref 70–99)
Potassium: 4.2 mmol/L (ref 3.5–5.1)
Sodium: 137 mmol/L (ref 135–145)

## 2023-08-26 LAB — D-DIMER, QUANTITATIVE: D-Dimer, Quant: 0.3 ug{FEU}/mL (ref 0.00–0.50)

## 2023-08-26 LAB — TROPONIN I (HIGH SENSITIVITY): Troponin I (High Sensitivity): 7 ng/L (ref <18)

## 2023-08-26 LAB — SARS CORONAVIRUS 2 BY RT PCR: SARS Coronavirus 2 by RT PCR: NEGATIVE

## 2023-08-26 MED ORDER — IOHEXOL 350 MG/ML SOLN
75.0000 mL | Freq: Once | INTRAVENOUS | Status: AC | PRN
  Administered 2023-08-26: 75 mL via INTRAVENOUS

## 2023-08-26 NOTE — ED Triage Notes (Signed)
Pt reports right sided chest pain for the past week, pt had a fall last week and thought he may have pulled a muscle so he followed up with his PCP but now the pain has not gotten any better and he states the pain feels similar to when he had a PE in 2020. Pt denies sob, states he just has the pain when he coughs or clears his throat. Pt currently taking coumadin.

## 2023-08-26 NOTE — Discharge Instructions (Signed)
You were seen in the ER today for chest pain. Your labs were thankfully reassuring. You had a CT angio to determine if there was a clot in your lungs and this was negative. There also no evidence of pneumonia, rib fractures, or anything else to explain any of the sensation you have been experiencing. I would advise taking Tylenol, ibuprofen, or Aleve as needed for pain control. If symptoms worsen, please return to the ER.

## 2023-08-26 NOTE — ED Provider Notes (Signed)
[place] EMERGENCY DEPARTMENT AT [facility] Provider Note   CSN: [id number] Arrival date & time: 08/26/23  1306     History Chief Complaint  Patient presents with   Chest Pain    Larry Odonnell is a 77 y.o. male.  Patient with past history significant for atrial fibrillation, pulmonary embolism, hyperlipidemia presents to the emergency department concerns of chest pain.  Reports he is experiencing right-sided chest pain for the last few days.  Prior history of PE and reports that his symptoms felt similar to this when he was diagnosed with a PE.  Denies any hemoptysis, recent prolonged travel or immobilization, recent surgery, or unilateral leg swelling.  States that has been taking his Coumadin as prescribed.   Chest Pain      Home Medications Prior to Admission medications   Medication Sig Start Date End Date Taking? Authorizing Provider  amitriptyline (ELAVIL) 50 MG tablet Take 1-2 tablets (50-100 mg total) by mouth at bedtime. 08/22/23   Christen Butter, NP  cholecalciferol (VITAMIN D) 25 MCG (1000 UNIT) tablet Take 1,000 Units by mouth daily.    [provider]  clonazePAM (KLONOPIN) 0.5 MG tablet Take 1 tablet (0.5 mg total) by mouth daily as needed for anxiety. 06/12/23   Christen Butter, NP  lisinopril (ZESTRIL) 10 MG tablet Take 1 tablet (10 mg total) by mouth daily. 06/19/23   Meriam Sprague, MD  Melatonin 10 MG TABS Take 10 mg by mouth at bedtime.    [provider]  metoprolol succinate (TOPROL-XL) 50 MG 24 hr tablet Take 0.5 tablets (25 mg total) by mouth 2 (two) times daily. Take with or immediately following a meal. 07/28/23   Eustace Pen, PA-C  Multiple Vitamin (MULTI-VITAMIN) tablet Take 1 tablet by mouth daily.    [provider]  Omega-3 1000 MG CAPS Take 1,000 mg by mouth daily.    [provider]  rosuvastatin (CRESTOR) 10 MG tablet Take 1 tablet (10 mg total) by mouth daily. 06/19/23   Meriam Sprague, MD   warfarin (COUMADIN) 1 MG tablet TAKE 2 TABLETS BY MOUTH ONCE  DAILY ALONG WITH 5 MG TABLET OR  AS DIRECTED BY COUMADIN CLINIC Patient taking differently: TAKE 2 TABLETS BY MOUTH ONCE  DAILY ALONG WITH 5 MG TABLET OR  AS DIRECTED BY COUMADIN CLINIC 06/06/23   Eustace Pen, PA-C  warfarin (COUMADIN) 5 MG tablet TAKE 1/2 TABLET TO 1 TABLET BY MOUTH DAILY AS DIRECTED BY COUMADIN CLINIC 06/21/23   Meriam Sprague, MD      Allergies    Atorvastatin, Oxycodone, and Latex    Review of Systems   Review of Systems  Cardiovascular:  Positive for chest pain.  All other systems reviewed and are negative.   Physical Exam Updated Vital Signs BP (!) 161/63   Pulse 60   Temp 97.7 F (36.5 C) (Oral)   Resp 15   SpO2 100%  Physical Exam Vitals and nursing note reviewed.  Constitutional:      General: He is not in acute distress.    Appearance: He is well-developed.  HENT:     Head: Normocephalic and atraumatic.  Eyes:     Conjunctiva/sclera: Conjunctivae normal.  Cardiovascular:     Rate and Rhythm: Normal rate and regular rhythm.     Heart sounds: No murmur heard. Pulmonary:     Effort: Pulmonary effort is normal. No respiratory distress.     Breath sounds: Normal breath sounds.  Abdominal:  Palpations: Abdomen is soft.     Tenderness: There is no abdominal tenderness.  Musculoskeletal:        General: No swelling.     Cervical back: Neck supple.  Skin:    General: Skin is warm and dry.     Capillary Refill: Capillary refill takes less than 2 seconds.  Neurological:     Mental Status: He is alert.  Psychiatric:        Mood and Affect: Mood normal.     ED Results / Procedures / Treatments   Labs (all labs ordered are listed, but only abnormal results are displayed) Labs Reviewed  BASIC METABOLIC PANEL - Abnormal; Notable for the following components:      Result Value   Glucose, Bld 101 (*)    All other components within normal limits  CBC - Abnormal; Notable for  the following components:   RBC 4.07 (*)    All other components within normal limits  PROTIME-INR - Abnormal; Notable for the following components:   Prothrombin Time 25.2 (*)    INR 2.3 (*)    All other components within normal limits  SARS CORONAVIRUS 2 BY RT PCR  D-DIMER, QUANTITATIVE  TROPONIN I (HIGH SENSITIVITY)  TROPONIN I (HIGH SENSITIVITY)    EKG None  Radiology CT Angio Chest PE W and/or Wo Contrast  Result Date: 08/26/2023 CLINICAL DATA:  Chest pain.  Evaluate for pulmonary embolism. EXAM: CT ANGIOGRAPHY CHEST WITH CONTRAST TECHNIQUE: Multidetector CT imaging of the chest was performed using the standard protocol during bolus administration of intravenous contrast. Multiplanar CT image reconstructions and MIPs were obtained to evaluate the vascular anatomy. RADIATION DOSE REDUCTION: This exam was performed according to the departmental dose-optimization program which includes automated exposure control, adjustment of the mA and/or kV according to patient size and/or use of iterative reconstruction technique. CONTRAST:  75mL OMNIPAQUE IOHEXOL 350 MG/ML SOLN COMPARISON:  Plain film of earlier today. Lung cancer screening CT 11/02/2022. FINDINGS: Cardiovascular: The quality of this exam for evaluation of pulmonary embolism is good. No evidence of pulmonary embolism. Aortic atherosclerosis. Mild cardiomegaly, without pericardial effusion. Three vessel coronary artery calcification. Mediastinum/Nodes: No mediastinal or hilar adenopathy. Tiny hiatal hernia. Lungs/Pleura: No pleural fluid.  Mild centrilobular emphysema. Clustered nodularity within the posterior right upper lobe again identified and likely postinfectious/inflammatory. Upper Abdomen: Normal imaged portions of the liver, spleen, pancreas, adrenal glands an incompletely imaged fluid density structure of 3.5 cm likely represents an exophytic right renal cyst as detailed on 01/26/2017 ultrasound. Musculoskeletal: No acute osseous  abnormality. Review of the MIP images confirms the above findings. IMPRESSION: 1. No evidence of pulmonary embolism. No explanation for chest pain. 2. Coronary artery atherosclerosis. Aortic Atherosclerosis (ICD10-I70.0). Emphysema (ICD10-J43.9). 3.  Tiny hiatal hernia. Electronically Signed   By: Jeronimo Greaves M.D.   On: 08/26/2023 18:07   DG Chest 2 View  Result Date: 08/26/2023 CLINICAL DATA:  Chest pain. EXAM: CHEST - 2 VIEW COMPARISON:  None Available. FINDINGS: The lungs are clear without focal pneumonia, edema, pneumothorax or pleural effusion. The cardiopericardial silhouette is within normal limits for size. No acute bony abnormality. IMPRESSION: No active cardiopulmonary disease. Electronically Signed   By: Kennith Center M.D.   On: 08/26/2023 14:23    Procedures Procedures   Medications Ordered in ED Medications  iohexol (OMNIPAQUE) 350 MG/ML injection 75 mL (75 mLs Intravenous Contrast Given 08/26/23 1701)    ED Course/ Medical Decision Making/ A&P  Medical Decision Making Amount and/or Complexity of Data Reviewed Labs: ordered. Radiology: ordered.  Risk Prescription drug management.   This patient presents to the ED for concern of chest pain. Differential diagnosis includes ACS, MI, PE, pneumonia, bronchitis, COVID-19   Lab Tests:  I Ordered, and personally interpreted labs.  The pertinent results include: CBC is unremarkable, BMP unremarkable, troponin negative, D-dimer negative, COVID-19 negative, PT/INR at 25.2 and 2.3 respectively   Imaging Studies ordered:  I ordered imaging studies including chest x-ray, CT angio chest I independently visualized and interpreted imaging which showed no evidence of any acute abnormalities in the chest, no evidence of PE I agree with the radiologist interpretation   Problem List / ED Course:  Patient presents to the emergency department concerns of chest pain.  Reports that he has been experiencing  an atypical sensation right side of his chest for the last few days.  Concerned that the symptoms are similar to when he previously was diagnosed with pulmonary embolism several years ago.  Patient is currently taking Coumadin 5 low concern this time for an actual pulmonary embolism however PEs are still possible even when on an anticoagulant.  Basic labs ordered including CBC, BMP, troponin.  Added on D-dimer for evaluation possible PE although difficult to assess given that patient is anticoagulated.  COVID-19 also ordered and PT/INR. Labs are largely reassuring without any real abnormality noted and D-dimer is in fact negative.  However given patient's history, suspect low risk for PE and will proceed with CT angio scan.  No acute findings on chest x-ray to account for symptoms such as pneumonia or viral process, bronchitis, etc. CT angio PE negative for any acute abnormality such as pulm embolism, pneumonia, bronchitis, rib fractures.  Informed patient of findings and advised patient to follow-up with PCP.  Given that patient has remained hemodynamically stable here in the ER and no acute concern necessitating admission at this time. Patient agreeable with this plan and verbalized understanding all return precautions. All questions answered prior to patient discharge.  Final Clinical Impression(s) / ED Diagnoses Final diagnoses:  Other chest pain    Rx / DC Orders ED Discharge Orders     None         Smitty Knudsen, PA-C 08/26/23 1826    Anders Simmonds T, DO 08/26/23 2105

## 2023-08-29 ENCOUNTER — Telehealth: Payer: Self-pay | Admitting: General Practice

## 2023-08-29 ENCOUNTER — Ambulatory Visit (INDEPENDENT_AMBULATORY_CARE_PROVIDER_SITE_OTHER): Payer: Medicare Other | Admitting: Medical-Surgical

## 2023-08-29 DIAGNOSIS — Z0000 Encounter for general adult medical examination without abnormal findings: Secondary | ICD-10-CM

## 2023-08-29 NOTE — Progress Notes (Signed)
MEDICARE ANNUAL WELLNESS VISIT  08/29/2023  Telephone Visit Disclaimer This Medicare AWV was conducted by telephone due to national recommendations for restrictions regarding the COVID-19 Pandemic (e.g. social distancing).  I verified, using two identifiers, that I am speaking with Larry Odonnell or their authorized healthcare agent. I discussed the limitations, risks, security, and privacy concerns of performing an evaluation and management service by telephone and the potential availability of an in-person appointment in the future. The patient expressed understanding and agreed to proceed.  Location of Patient: Home Location of Provider (nurse):  In the office.  Subjective:    Larry Odonnell is a 77 y.o. male patient of Christen Butter, NP who had a Medicare Annual Wellness Visit today via telephone. Larry Odonnell is Retired and lives with their spouse. he has 1 child. he reports that he is socially active and does interact with friends/family regularly. he is minimally physically active and enjoys yard work.  Patient Care Team: Christen Butter, NP as PCP - General (Nurse Practitioner) Meriam Sprague, MD as PCP - Cardiology (Cardiology) Lanier Prude, MD as PCP - Electrophysiology (Cardiology)     08/29/2023    8:26 AM 05/08/2023    5:42 AM 02/23/2023    8:19 AM 08/22/2022    9:07 AM 07/21/2022    1:28 PM 08/09/2021    9:49 AM 11/26/2020    7:15 PM  Advanced Directives  Does Patient Have a Medical Advance Directive? Yes Yes Yes Yes Yes Yes No  Type of Advance Directive Living will;Healthcare Power of Attorney Living will Healthcare Power of Attorney Living will Healthcare Power of [place];Living will Healthcare Power of [place];Out of facility DNR (pink MOST or yellow form)   Does patient want to make changes to medical advance directive? No - Patient declined No - Patient declined  No - Patient declined  No - Patient declined   Copy of Healthcare Power of Attorney in Chart? No -  copy requested  No - copy requested  No - copy requested      Hospital Utilization Over the Past 12 Months: # of hospitalizations or ER visits: 4 # of surgeries: 0  Review of Systems    Patient reports that his overall health is unchanged compared to last year.  History obtained from chart review and the patient  Patient Reported Readings (BP, Pulse, CBG, Weight, etc)  Per patient no change in vitals since last visit, unable to obtain new vitals due to telehealth visit and lack of equipment.  Pain Assessment Pain : No/denies pain     Current Medications & Allergies (verified) Allergies as of 08/29/2023       Reactions   Atorvastatin Other (See Comments)   Pt reports causes leg pains and leg heaviness   Oxycodone Other (See Comments)   Causes extreme dizziness and falling down   Latex Rash        Medication List        Accurate as of August 29, 2023  8:35 AM. If you have any questions, ask your nurse or doctor.          amitriptyline 50 MG tablet Commonly known as: ELAVIL Take 1-2 tablets (50-100 mg total) by mouth at bedtime.   cholecalciferol 25 MCG (1000 UNIT) tablet Commonly known as: VITAMIN D3 Take 1,000 Units by mouth daily.   clonazePAM 0.5 MG tablet Commonly known as: KLONOPIN Take 1 tablet (0.5 mg total) by mouth daily as needed for anxiety.   lisinopril 10  MG tablet Commonly known as: ZESTRIL Take 1 tablet (10 mg total) by mouth daily.   Melatonin 10 MG Tabs Take 10 mg by mouth at bedtime.   metoprolol succinate 50 MG 24 hr tablet Commonly known as: TOPROL-XL Take 0.5 tablets (25 mg total) by mouth 2 (two) times daily. Take with or immediately following a meal.   Multi-Vitamin tablet Take 1 tablet by mouth daily.   Omega-3 1000 MG Caps Take 1,000 mg by mouth daily.   rosuvastatin 10 MG tablet Commonly known as: CRESTOR Take 1 tablet (10 mg total) by mouth daily.   warfarin 1 MG tablet Commonly known as: COUMADIN Take as directed  by the anticoagulation clinic. If you are unsure how to take this medication, talk to your nurse or doctor. Original instructions: TAKE 2 TABLETS BY MOUTH ONCE  DAILY ALONG WITH 5 MG TABLET OR  AS DIRECTED BY COUMADIN CLINIC   warfarin 5 MG tablet Commonly known as: COUMADIN Take as directed by the anticoagulation clinic. If you are unsure how to take this medication, talk to your nurse or doctor. Original instructions: TAKE 1/2 TABLET TO 1 TABLET BY MOUTH DAILY AS DIRECTED BY COUMADIN CLINIC        History (reviewed): Past Medical History:  Diagnosis Date   A-fib [facility])    Allergy    Latex   Anxiety 2006   Cancer [facility]) 2012   Prostate   Cataract 2017   Surgery both eyes   Depression 2006   DVT (deep venous thrombosis) (HCC)    Encounter for monitoring warfarin therapy 07/22/2019   History of colon polyps    1 adenoma: 6- 2018   Hypertension    Kidney stones 01/08/2020   Mixed hyperlipidemia 08/09/2021   Peripheral neuropathy 08/02/2012   Primary osteoarthritis of left hip 11/23/2021   Pulmonary embolism (HCC)    Pulmonary emphysema (HCC) 11/07/2022   Pulmonary emphysema (HCC) 11/07/2022   Tobacco dependence 07/02/2019   Past Surgical History:  Procedure Laterality Date   ATRIAL FIBRILLATION ABLATION N/A 05/08/2023   Procedure: ATRIAL FIBRILLATION ABLATION;  Surgeon: Lanier Prude, MD;  Location: [facility] INVASIVE CV LAB;  Service: Cardiovascular;  Laterality: N/A;   CARDIOVERSION N/A 07/21/2022   Procedure: CARDIOVERSION;  Surgeon: Wendall Stade, MD;  Location: [facility] ENDOSCOPY;  Service: Cardiovascular;  Laterality: N/A;   CARDIOVERSION N/A 02/23/2023   Procedure: CARDIOVERSION;  Surgeon: Wendall Stade, MD;  Location: [facility] ENDOSCOPY;  Service: Cardiovascular;  Laterality: N/A;   EYE SURGERY  2017   Cataracts   PROSTATECTOMY     TEE WITHOUT CARDIOVERSION N/A 02/23/2023   Procedure: TRANSESOPHAGEAL ECHOCARDIOGRAM (TEE);  Surgeon: Wendall Stade, MD;  Location: [facility] ENDOSCOPY;   Service: Cardiovascular;  Laterality: N/A;   TONSILLECTOMY     Family History  Problem Relation Age of Onset   Anxiety disorder Mother    Cancer Mother    Stroke Mother    Alcohol abuse Father    Early death Father    Anxiety disorder Brother    Depression Brother    Anxiety disorder Daughter    Depression Daughter    Esophageal cancer Neg Hx    Social History   Socioeconomic History   Marital status: Married    Spouse name: Larry Odonnell   Number of children: 1   Years of education: 13   Highest education level: Some college, no degree  Occupational History   Occupation: Retired  Tobacco Use   Smoking status: Every Day  Current packs/day: 0.50    Average packs/day: 1.2 packs/day for 51.7 years (61.1 ttl pk-yrs)    Types: Cigarettes    Start date: 2020   Smokeless tobacco: Never   Tobacco comments:    Quit [id number]  Vaping Use   Vaping status: Never Used  Substance and Sexual Activity   Alcohol use: Not Currently    Comment: No alcohol since 1995   Drug use: Never   Sexual activity: Not Currently    Birth control/protection: None  Other Topics Concern   Not on file  Social History Narrative   Lives with wife. He enjoys yardwork.   Social Determinants of Health   Financial Resource Strain: Low Risk  (08/25/2023)   Overall Financial Resource Strain (CARDIA)    Difficulty of Paying Living Expenses: Not hard at all  Food Insecurity: No Food Insecurity (08/25/2023)   Hunger Vital Sign    Worried About Running Out of Food in the Last Year: Never true    Ran Out of Food in the Last Year: Never true  Transportation Needs: No Transportation Needs (08/25/2023)   PRAPARE - Administrator, Civil Service (Medical): No    Lack of Transportation (Non-Medical): No  Physical Activity: Inactive (08/25/2023)   Exercise Vital Sign    Days of Exercise per Week: 0 days    Minutes of Exercise per Session: 0 min  Stress: No Stress Concern Present (08/25/2023)    [employer] of Occupational Health - Occupational Stress Questionnaire    Feeling of Stress : Only a little  Social Connections: Socially Integrated (08/29/2023)   Social Connection and Isolation Panel [NHANES]    Frequency of Communication with Friends and Family: Twice a week    Frequency of Social Gatherings with Friends and Family: Once a week    Attends Religious Services: More than 4 times per year    Active Member of [employer] or Organizations: Yes    Attends Banker Meetings: 1 to 4 times per year    Marital Status: Married    Activities of Daily Living    08/25/2023    1:49 PM 05/08/2023    5:37 AM  In your present state of health, do you have any difficulty performing the following activities:  Hearing? 0 0  Vision? 0 0  Difficulty concentrating or making decisions? 0 0  Walking or climbing stairs? 0 0  Dressing or bathing? 0 0  Doing errands, shopping? 0   Preparing Food and eating ? N   Using the Toilet? N   In the past six months, have you accidently leaked urine? N   Do you have problems with loss of bowel control? N   Managing your Medications? N   Managing your Finances? N   Housekeeping or managing your Housekeeping? N     Patient Education/ Literacy How often do you need to have someone help you when you read instructions, pamphlets, or other written materials from your doctor or pharmacy?: 1 - Never What is the last grade level you completed in school?: some college  Exercise    Diet Patient reports consuming  2-3  meals a day and 1 snack(s) a day Patient reports that his primary diet is: Regular Patient reports that she does have regular access to food.   Depression Screen    08/29/2023    8:27 AM 08/22/2023    9:25 AM 05/04/2023    8:14 AM 03/23/2023    8:33 AM 11/29/2022  10:51 AM 08/22/2022    9:04 AM 08/09/2022    9:15 AM  PHQ 2/9 Scores  PHQ - 2 Score 0 0 1 0 0 0 0  PHQ- 9 Score   2   0 1     Fall Risk    08/29/2023    8:27 AM  08/25/2023    1:49 PM 08/22/2023    9:24 AM 03/23/2023    8:32 AM 02/09/2023    9:29 AM  Fall Risk   Falls in the past year? 1 1 1  0 0  Number falls in past yr: 0 0 0 0 0  Injury with Fall? 0 0 1 0 0  Comment   Scratches to forehead    Risk for fall due to : History of fall(s)  No Fall Risks No Fall Risks No Fall Risks  Follow up Falls evaluation completed;Education provided;Falls prevention discussed  Falls evaluation completed Falls evaluation completed Falls evaluation completed     Objective:  Larry Odonnell seemed alert and oriented and he participated appropriately during our telephone visit.  Blood Pressure Weight BMI  BP Readings from Last 3 Encounters:  08/26/23 (!) 161/63  08/22/23 130/77  06/05/23 (!) 146/82   Wt Readings from Last 3 Encounters:  08/22/23 209 lb (94.8 kg)  06/05/23 206 lb 9.6 oz (93.7 kg)  05/08/23 200 lb (90.7 kg)   BMI Readings from Last 1 Encounters:  08/22/23 27.57 kg/m    *Unable to obtain current vital signs, weight, and BMI due to telephone visit type  Hearing/Vision  Larry Odonnell did not seem to have difficulty with hearing/understanding during the telephone conversation Reports that he has had a formal eye exam by an eye care professional within the past year Reports that he has not had a formal hearing evaluation within the past year *Unable to fully assess hearing and vision during telephone visit type  Cognitive Function:    08/29/2023    8:30 AM 08/22/2022    9:07 AM  6CIT Screen  What Year? 0 points 0 points  What month? 0 points 0 points  What time? 0 points 0 points  Count back from 20 0 points 0 points  Months in reverse 0 points 0 points  Repeat phrase 2 points 2 points  Total Score 2 points 2 points   (Normal:0-7, Significant for Dysfunction: >8)  Normal Cognitive Function Screening: Yes   Immunization & Health Maintenance Record Immunization History  Administered Date(s) Administered   Pneumococcal Conjugate-13  06/25/2015   Pneumococcal Polysaccharide-23 02/25/2014   Tdap 03/04/2014   Zoster Recombinant(Shingrix) 03/17/2018, 06/06/2018   Zoster, Live 10/31/2014    Health Maintenance  Topic Date Due   INFLUENZA VACCINE  03/25/2024 (Originally 07/27/2023)   Colonoscopy  08/28/2024 (Originally 06/06/2022)   DTaP/Tdap/Td (2 - Td or Tdap) 03/04/2024   Lung Cancer Screening  08/25/2024   Medicare Annual Wellness (AWV)  08/28/2024   Pneumonia Vaccine 24+ Years old  Completed   Hepatitis C Screening  Completed   Zoster Vaccines- Shingrix  Completed   HPV VACCINES  Aged Out   COVID-19 Vaccine  Discontinued       Assessment  This is a routine wellness examination for Larry Odonnell.  Health Maintenance: Due or Overdue There are no preventive care reminders to display for this patient.   Larry Odonnell does not need a referral for Community Assistance: Care Management:   no Social Work:    no Prescription Assistance:  no Nutrition/Diabetes Education:  no  Plan:  Personalized Goals  Goals Addressed               This Visit's Progress     Patient Stated (pt-stated)        Patient stated that he would like to continue to stay healthy.       Personalized Health Maintenance & Screening Recommendations  Influenza vaccine Colorectal cancer screening  Patient declined at the vaccine and colorectal cancer screening at this time.  Lung Cancer Screening Recommended: yes; scheduled (Low Dose CT Chest recommended if Age 46-80 years, 20 pack-year currently smoking OR have quit w/in past 15 years) Hepatitis C Screening recommended: no HIV Screening recommended: no  Advanced Directives: Written information was not prepared per patient's request.  Referrals & Orders No orders of the defined types were placed in this encounter.   Follow-up Plan Follow-up with Christen Butter, NP as planned Medicare wellness visit in one year.  Patient will access AVS on my chart.   I have  personally reviewed and noted the following in the patient's chart:   Medical and social history Use of alcohol, tobacco or illicit drugs  Current medications and supplements Functional ability and status Nutritional status Physical activity Advanced directives List of other physicians Hospitalizations, surgeries, and ER visits in previous 12 months Vitals Screenings to include cognitive, depression, and falls Referrals and appointments  In addition, I have reviewed and discussed with Larry Odonnell certain preventive protocols, quality metrics, and best practice recommendations. A written personalized care plan for preventive services as well as general preventive health recommendations is available and can be mailed to the patient at his request.      Modesto Charon, RN BSN  08/29/2023

## 2023-08-29 NOTE — Patient Instructions (Addendum)
MEDICARE ANNUAL WELLNESS VISIT Health Maintenance Summary and Written Plan of Care  [name] ,  Thank you for allowing me to perform your Medicare Annual Wellness Visit and for your ongoing commitment to your health.   Health Maintenance & Immunization History Health Maintenance  Topic Date Due   INFLUENZA VACCINE  03/25/2024 (Originally 07/27/2023)   Colonoscopy  08/28/2024 (Originally 06/06/2022)   DTaP/Tdap/Td (2 - Td or Tdap) 03/04/2024   Lung Cancer Screening  08/25/2024   Medicare Annual Wellness (AWV)  08/28/2024   Pneumonia Vaccine 25+ Years old  Completed   Hepatitis C Screening  Completed   Zoster Vaccines- Shingrix  Completed   HPV VACCINES  Aged Out   COVID-19 Vaccine  Discontinued   Immunization History  Administered Date(s) Administered   Pneumococcal Conjugate-13 06/25/2015   Pneumococcal Polysaccharide-23 02/25/2014   Tdap 03/04/2014   Zoster Recombinant(Shingrix) 03/17/2018, 06/06/2018   Zoster, Live 10/31/2014    These are the patient goals that we discussed:  Goals Addressed               This Visit's Progress     Patient Stated (pt-stated)        Patient stated that he would like to continue to stay healthy.         This is a list of Health Maintenance Items that are overdue or due now: Influenza vaccine Colorectal cancer screening  Patient declined at the vaccine and colorectal cancer screening at this time.  Orders/Referrals Placed Today: No orders of the defined types were placed in this encounter.  (Contact our referral department at [phone] if you have not spoken with someone about your referral appointment within the next 5 days)    Follow-up Plan Follow-up with Christen Butter, NP as planned Medicare wellness visit in one year.  Patient will access AVS on my chart.      Health Maintenance, Male Adopting a healthy lifestyle and getting preventive care are important in promoting health and wellness. Ask your health care  provider about: The right schedule for you to have regular tests and exams. Things you can do on your own to prevent diseases and keep yourself healthy. What should I know about diet, weight, and exercise? Eat a healthy diet  Eat a diet that includes plenty of vegetables, fruits, low-fat dairy products, and lean protein. Do not eat a lot of foods that are high in solid fats, added sugars, or sodium. Maintain a healthy weight Body mass index (BMI) is a measurement that can be used to identify possible weight problems. It estimates body fat based on height and weight. Your health care provider can help determine your BMI and help you achieve or maintain a healthy weight. Get regular exercise Get regular exercise. This is one of the most important things you can do for your health. Most adults should: Exercise for at least 150 minutes each week. The exercise should increase your heart rate and make you sweat (moderate-intensity exercise). Do strengthening exercises at least twice a week. This is in addition to the moderate-intensity exercise. Spend less time sitting. Even light physical activity can be beneficial. Watch cholesterol and blood lipids Have your blood tested for lipids and cholesterol at 76 years of age, then have this test every 5 years. You may need to have your cholesterol levels checked more often if: Your lipid or cholesterol levels are high. You are older than 76 years of age. You are at high risk for heart disease. What should I  know about cancer screening? Many types of cancers can be detected early and may often be prevented. Depending on your health history and family history, you may need to have cancer screening at various ages. This may include screening for: Colorectal cancer. Prostate cancer. Skin cancer. Lung cancer. What should I know about heart disease, diabetes, and high blood pressure? Blood pressure and heart disease High blood pressure causes heart  disease and increases the risk of stroke. This is more likely to develop in people who have high blood pressure readings or are overweight. Talk with your health care provider about your target blood pressure readings. Have your blood pressure checked: Every 3-5 years if you are 62-70 years of age. Every year if you are 36 years old or older. If you are between the ages of 40 and 63 and are a current or former smoker, ask your health care provider if you should have a one-time screening for abdominal aortic aneurysm (AAA). Diabetes Have regular diabetes screenings. This checks your fasting blood sugar level. Have the screening done: Once every three years after age 63 if you are at a normal weight and have a low risk for diabetes. More often and at a younger age if you are overweight or have a high risk for diabetes. What should I know about preventing infection? Hepatitis B If you have a higher risk for hepatitis B, you should be screened for this virus. Talk with your health care provider to find out if you are at risk for hepatitis B infection. Hepatitis C Blood testing is recommended for: Everyone born from [birth year] through 1964. Anyone with known risk factors for hepatitis C. Sexually transmitted infections (STIs) You should be screened each year for STIs, including gonorrhea and chlamydia, if: You are sexually active and are younger than 76 years of age. You are older than 76 years of age and your health care provider tells you that you are at risk for this type of infection. Your sexual activity has changed since you were last screened, and you are at increased risk for chlamydia or gonorrhea. Ask your health care provider if you are at risk. Ask your health care provider about whether you are at high risk for HIV. Your health care provider may recommend a prescription medicine to help prevent HIV infection. If you choose to take medicine to prevent HIV, you should first get tested for HIV.  You should then be tested every 3 months for as long as you are taking the medicine. Follow these instructions at home: Alcohol use Do not drink alcohol if your health care provider tells you not to drink. If you drink alcohol: Limit how much you have to 0-2 drinks a day. Know how much alcohol is in your drink. In the [place] one drink equals one 12 oz bottle of beer (355 mL), one 5 oz glass of wine (148 mL), or one 1 oz glass of hard liquor (44 mL). Lifestyle Do not use any products that contain nicotine or tobacco. These products include cigarettes, chewing tobacco, and vaping devices, such as e-cigarettes. If you need help quitting, ask your health care provider. Do not use street drugs. Do not share needles. Ask your health care provider for help if you need support or information about quitting drugs. General instructions Schedule regular health, dental, and eye exams. Stay current with your vaccines. Tell your health care provider if: You often feel depressed. You have ever been abused or do not feel safe at home.  Summary Adopting a healthy lifestyle and getting preventive care are important in promoting health and wellness. Follow your health care provider's instructions about healthy diet, exercising, and getting tested or screened for diseases. Follow your health care provider's instructions on monitoring your cholesterol and blood pressure. This information is not intended to replace advice given to you by your health care provider. Make sure you discuss any questions you have with your health care provider. Document Revised: 05/03/2021 Document Reviewed: 05/03/2021 Elsevier Patient Education  2024 [employer].

## 2023-08-29 NOTE — Transitions of Care (Post Inpatient/ED Visit) (Signed)
08/29/2023  Name: Larry Odonnell MRN: [id number] DOB: 11-15-1946  Today's TOC FU Call Status: Today's TOC FU Call Status:: Successful TOC FU Call Completed TOC FU Call Complete Date: 08/29/23 Patient's Name and Date of Birth confirmed.  Transition Care Management Follow-up Telephone Call Date of Discharge: 08/26/23 Discharge Facility: [facility]) Type of Discharge: Emergency Department Reason for ED Visit: Cardiac Conditions Cardiac Conditions Diagnosis: Chest Pain Persisting How have you been since you were released from the hospital?: Better Any questions or concerns?: No  Items Reviewed: Did you receive and understand the discharge instructions provided?: Yes Medications obtained,verified, and reconciled?: Yes (Medications Reviewed) Any new allergies since your discharge?: No Dietary orders reviewed?: NA Do you have support at home?: Yes People in Home: spouse  Medications Reviewed Today: Medications Reviewed Today     Reviewed by Modesto Charon, RN (Registered Nurse) on 08/29/23 at 1453  Med List Status: <None>   Medication Order Taking? Sig Documenting Provider Last Dose Status Informant  amitriptyline (ELAVIL) 50 MG tablet [id number] No Take 1-2 tablets (50-100 mg total) by mouth at bedtime. Christen Butter, NP Taking Active   cholecalciferol (VITAMIN D) 25 MCG (1000 UNIT) tablet [id number] No Take 1,000 Units by mouth daily. [provider] Taking Active Self  clonazePAM (KLONOPIN) 0.5 MG tablet [id number] No Take 1 tablet (0.5 mg total) by mouth daily as needed for anxiety. Christen Butter, NP Taking Active   lisinopril (ZESTRIL) 10 MG tablet [id number] No Take 1 tablet (10 mg total) by mouth daily. Meriam Sprague, MD Taking Active   Melatonin 10 MG TABS [id number] No Take 10 mg by mouth at bedtime. [provider] Taking Active Self  metoprolol succinate (TOPROL-XL) 50 MG 24 hr tablet [id number] No Take 0.5 tablets (25 mg total) by mouth 2 (two) times  daily. Take with or immediately following a meal. Eustace Pen, PA-C Taking Active   Multiple Vitamin (MULTI-VITAMIN) tablet [id number] No Take 1 tablet by mouth daily. [provider] Taking Active Self  Omega-3 1000 MG CAPS [id number] No Take 1,000 mg by mouth daily. [provider] Taking Active Self  rosuvastatin (CRESTOR) 10 MG tablet [id number] No Take 1 tablet (10 mg total) by mouth daily. Meriam Sprague, MD Taking Active   warfarin (COUMADIN) 1 MG tablet [id number] No TAKE 2 TABLETS BY MOUTH ONCE  DAILY ALONG WITH 5 MG TABLET OR  AS DIRECTED BY COUMADIN CLINIC  Patient taking differently: TAKE 2 TABLETS BY MOUTH ONCE  DAILY ALONG WITH 5 MG TABLET OR  AS DIRECTED BY COUMADIN CLINIC   Eustace Pen, PA-C Taking Active Self  warfarin (COUMADIN) 5 MG tablet [id number] No TAKE 1/2 TABLET TO 1 TABLET BY MOUTH DAILY AS DIRECTED BY COUMADIN CLINIC Meriam Sprague, MD Taking Active             Home Care and Equipment/Supplies: Were Home Health Services Ordered?: NA Any new equipment or medical supplies ordered?: NA  Functional Questionnaire: Do you need assistance with bathing/showering or dressing?: No Do you need assistance with meal preparation?: No Do you need assistance with eating?: No Do you have difficulty maintaining continence: No Do you need assistance with getting out of bed/getting out of a chair/moving?: No Do you have difficulty managing or taking your medications?: No  Follow up appointments reviewed: PCP Follow-up appointment confirmed?: NA Specialist Hospital Follow-up appointment confirmed?: Yes Date of Specialist follow-up appointment?: 09/13/23 Follow-Up Specialty Provider:: Cardiology Do you need transportation to your follow-up appointment?:  No  SDOH Interventions Today    Flowsheet Row Most Recent Value  SDOH Interventions   Transportation Interventions Intervention Not Indicated       SIGNATURE Modesto Charon, RN  BSN Nurse Health Advisor

## 2023-08-30 ENCOUNTER — Ambulatory Visit: Payer: Medicare Other | Attending: Cardiovascular Disease | Admitting: *Deleted

## 2023-08-30 DIAGNOSIS — I824Y9 Acute embolism and thrombosis of unspecified deep veins of unspecified proximal lower extremity: Secondary | ICD-10-CM | POA: Diagnosis not present

## 2023-08-30 DIAGNOSIS — I4891 Unspecified atrial fibrillation: Secondary | ICD-10-CM | POA: Diagnosis not present

## 2023-08-30 DIAGNOSIS — Z5181 Encounter for therapeutic drug level monitoring: Secondary | ICD-10-CM

## 2023-08-30 DIAGNOSIS — I2693 Single subsegmental pulmonary embolism without acute cor pulmonale: Secondary | ICD-10-CM

## 2023-08-30 LAB — POCT INR: INR: 2.8 (ref 2.0–3.0)

## 2023-08-30 NOTE — Patient Instructions (Addendum)
Description   Continue taking Warfarin 5mg  daily except 2.5mg  on Tuesdays.   Recheck INR in 4 weeks.  Pt started amio on 1/26 & stopped 08/23/23-will see changes in 3 weeks or more. Anticoagulation Clinic [phone]

## 2023-08-31 ENCOUNTER — Ambulatory Visit (INDEPENDENT_AMBULATORY_CARE_PROVIDER_SITE_OTHER): Payer: Medicare Other

## 2023-08-31 DIAGNOSIS — R42 Dizziness and giddiness: Secondary | ICD-10-CM

## 2023-08-31 DIAGNOSIS — R55 Syncope and collapse: Secondary | ICD-10-CM

## 2023-09-01 ENCOUNTER — Encounter: Payer: Self-pay | Admitting: Medical-Surgical

## 2023-09-03 ENCOUNTER — Ambulatory Visit (INDEPENDENT_AMBULATORY_CARE_PROVIDER_SITE_OTHER): Payer: Medicare Other

## 2023-09-03 DIAGNOSIS — R42 Dizziness and giddiness: Secondary | ICD-10-CM | POA: Diagnosis not present

## 2023-09-03 DIAGNOSIS — R55 Syncope and collapse: Secondary | ICD-10-CM

## 2023-09-05 ENCOUNTER — Encounter: Payer: Self-pay | Admitting: Medical-Surgical

## 2023-09-07 ENCOUNTER — Telehealth: Payer: Self-pay | Admitting: Cardiology

## 2023-09-07 NOTE — Telephone Encounter (Signed)
Spoke with the patient who reports that he is still having dizziness and low energy. He states that he has to rest frequently when he works out in his yard. He reports that he will be walking and become dizzy and have to sit down. Once he rests symptoms resolve. He has not had any syncope. He saw his PCP recently in regards to his symptoms and had an MRI and carotid ultrasound done which came back normal. He reports that his blood pressure and heart rate have been normal. He does stay hydrated. He had called in about a month ago with similar symptoms and his amiodarone was stopped. Advised patient that if symptoms are due to amiodarone that I may take some time for him to feel better as it takes awhile to get out of his system. He is still taking metoprolol 25 mg twice daily - this was reduced from 50 mg at his appointment with AFIB clinic a couple of months ago. He has follow up scheduled with Dr. Lalla Brothers on 10/16.

## 2023-09-07 NOTE — Telephone Encounter (Signed)
STAT if patient feels like he/she is going to faint   1. Are you feeling dizzy, lightheaded, or faint right now? No but did earlier this morning    2. Have you passed out?  Not passed out but his dizziness has caused him to fall. (If yes move to [facility])   3. Do you have any other symptoms? no   4. Have you checked your HR and BP (record if available)? States BP and HR are good, could not give me readings.   Dizziness passes once he sits down, all of this has started after his ablation. Ablation f/u appt was scheduled for 09/18 with Dr. Lalla Brothers but has been pushed out until 10/16 due to change in providers schedule. Patient was going to discuss this at his appt but cannot wait until October to discuss.

## 2023-09-13 ENCOUNTER — Ambulatory Visit: Payer: Medicare Other | Admitting: Cardiology

## 2023-09-15 ENCOUNTER — Other Ambulatory Visit: Payer: Self-pay | Admitting: Cardiology

## 2023-09-27 ENCOUNTER — Ambulatory Visit: Payer: Medicare Other | Attending: Cardiology | Admitting: *Deleted

## 2023-09-27 DIAGNOSIS — I2693 Single subsegmental pulmonary embolism without acute cor pulmonale: Secondary | ICD-10-CM | POA: Diagnosis not present

## 2023-09-27 DIAGNOSIS — Z5181 Encounter for therapeutic drug level monitoring: Secondary | ICD-10-CM | POA: Diagnosis not present

## 2023-09-27 DIAGNOSIS — I4891 Unspecified atrial fibrillation: Secondary | ICD-10-CM

## 2023-09-27 DIAGNOSIS — I824Y9 Acute embolism and thrombosis of unspecified deep veins of unspecified proximal lower extremity: Secondary | ICD-10-CM | POA: Diagnosis not present

## 2023-09-27 LAB — POCT INR: INR: 2 (ref 2.0–3.0)

## 2023-09-27 NOTE — Patient Instructions (Signed)
Description   Today take 1.5 tablets of warfarin then continue taking Warfarin 5mg  daily except 2.5mg  on Tuesdays.   Recheck INR in 4 weeks.  Pt started amio on 1/26 & stopped 08/23/23-will see changes in 3 weeks or more. Anticoagulation Clinic [phone]

## 2023-10-10 NOTE — Progress Notes (Unsigned)
  Electrophysiology Office Follow up Visit Note:    Date:  10/11/2023   ID:  Larry Odonnell, DOB October 29, 1946, MRN [id number]  PCP:  Christen Butter, NP  [facility] Cardiologist:  Meriam Sprague, MD (Inactive)  [facility] Electrophysiologist:  Lanier Prude, MD    Interval History:    Larry Odonnell is a 77 y.o. male who presents for a follow up visit.   He had an A-fib ablation May 08, 2023 during which the veins and posterior wall were isolated. He saw Charlann Lange in the A-fib clinic June 05, 2023.  At that appointment he reported continuing to feel poorly after his catheter ablation despite being in sinus rhythm.  He wanted to stop his amiodarone which was subsequently done. He was seen in the emergency department August 26, 2023 with chest pain that felt similar to a prior pulmonary embolism.  CT scan was negative.  In reviewing his records, many of his symptoms including fatigue, lethargy, visual changes, brain fog started around January 2024 when his atrial fibrillation became persistent. He tells me that he stopped amiodarone about 1 month ago and is slowly felt better since then.  He was having severe symptoms of dizziness and gait instability while on amiodarone.  He had 2 falls while taking the medication.  No loss of consciousness but the falls were due to gait instability and his knees giving way.     Past medical, surgical, social and family history were reviewed.  ROS:   Please see the history of present illness.    All other systems reviewed and are negative.  EKGs/Labs/Other Studies Reviewed:    The following studies were reviewed today:   EKG Interpretation Date/Time:  Wednesday October 11 2023 13:57:51 EDT Ventricular Rate:  87 PR Interval:  162 QRS Duration:  82 QT Interval:  350 QTC Calculation: 421 R Axis:   27  Text Interpretation: Normal sinus rhythm Normal ECG Confirmed by Steffanie Dunn [phone]) on 10/11/2023 2:24:46 PM    Physical  Exam:    VS:  BP 112/72   Pulse 87   Ht 6\' 1"  (1.854 m)   Wt 205 lb (93 kg)   SpO2 97%   BMI 27.05 kg/m     Wt Readings from Last 3 Encounters:  10/11/23 205 lb (93 kg)  08/22/23 209 lb (94.8 kg)  06/05/23 206 lb 9.6 oz (93.7 kg)     GEN:  Well nourished, well developed in no acute distress CARDIAC: RRR, no murmurs, rubs, gallops RESPIRATORY:  Clear to auscultation without rales, wheezing or rhonchi       ASSESSMENT:    1. Primary hypertension   2. Persistent atrial fibrillation (HCC)    PLAN:    In order of problems listed above:  #Persistent atrial fibrillation Maintaining sinus rhythm after his catheter ablation in May 2024.  Now off amiodarone. Continue Coumadin  #Hypertension At goal today.  Recommend checking blood pressures 1-2 times per week at home and recording the values.  Recommend bringing these recordings to the primary care physician. Continue lisinopril   Follow-up 1 year with APP.    Signed, Steffanie Dunn, MD, [facility], [facility] 10/11/2023 2:41 PM    Electrophysiology [facility]

## 2023-10-11 ENCOUNTER — Encounter: Payer: Self-pay | Admitting: Cardiology

## 2023-10-11 ENCOUNTER — Ambulatory Visit: Payer: Medicare Other | Attending: Cardiology | Admitting: Cardiology

## 2023-10-11 VITALS — BP 112/72 | HR 87 | Ht 73.0 in | Wt 205.0 lb

## 2023-10-11 DIAGNOSIS — I4819 Other persistent atrial fibrillation: Secondary | ICD-10-CM | POA: Diagnosis not present

## 2023-10-11 DIAGNOSIS — I10 Essential (primary) hypertension: Secondary | ICD-10-CM

## 2023-10-11 NOTE — Patient Instructions (Signed)
Medication Instructions:  Your physician recommends that you continue on your current medications as directed. Please refer to the Current Medication list given to you today.  *If you need a refill on your cardiac medications before your next appointment, please call your pharmacy*  Follow-Up: At [facility], you and your health needs are our priority.  As part of our continuing mission to provide you with exceptional heart care, we have created designated Provider Care Teams.  These Care Teams include your primary Cardiologist (physician) and Advanced Practice Providers (APPs -  Physician Assistants and Nurse Practitioners) who all work together to provide you with the care you need, when you need it.  Your next appointment:   1 year  Provider:   You will see one of the following Advanced Practice Providers on your designated Care Team:   Francis Dowse, Charlott Holler "Mardelle Matte" [place], [place] Sherie Don, NP Canary Brim, NP  Other Instructions You have been referred to see a General Cardiologist - Dr. Charlton Haws

## 2023-10-25 ENCOUNTER — Ambulatory Visit: Payer: Medicare Other | Attending: Internal Medicine

## 2023-10-25 DIAGNOSIS — I824Y9 Acute embolism and thrombosis of unspecified deep veins of unspecified proximal lower extremity: Secondary | ICD-10-CM | POA: Diagnosis not present

## 2023-10-25 DIAGNOSIS — Z5181 Encounter for therapeutic drug level monitoring: Secondary | ICD-10-CM | POA: Diagnosis not present

## 2023-10-25 DIAGNOSIS — I4891 Unspecified atrial fibrillation: Secondary | ICD-10-CM

## 2023-10-25 LAB — POCT INR: INR: 2.1 (ref 2.0–3.0)

## 2023-10-25 IMAGING — DX DG HIP (WITH OR WITHOUT PELVIS) 2-3V*L*
3 series · 3 of 3 positions shown · non-contrast
Comparison: None.

CLINICAL DATA: Left hip pain for years.

EXAM:
DG HIP (WITH OR WITHOUT PELVIS) 2-3V LEFT

[pelvis ap]
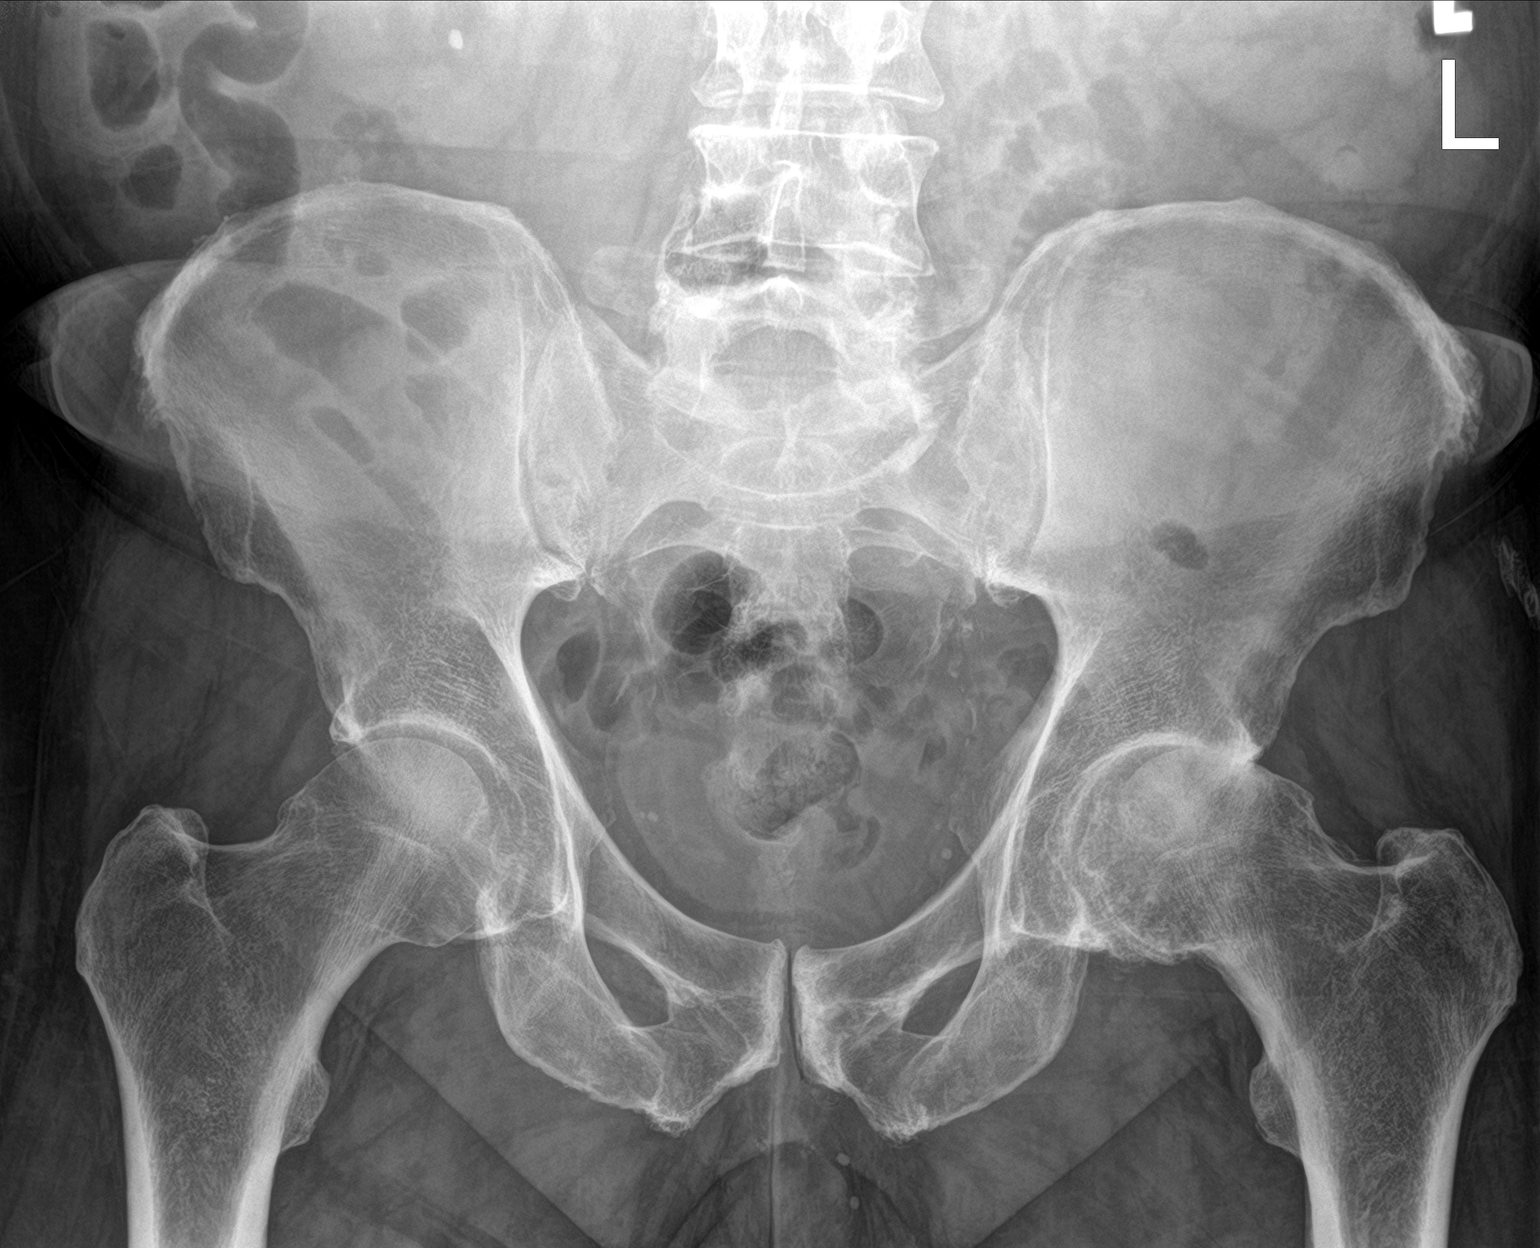

[hip ap]
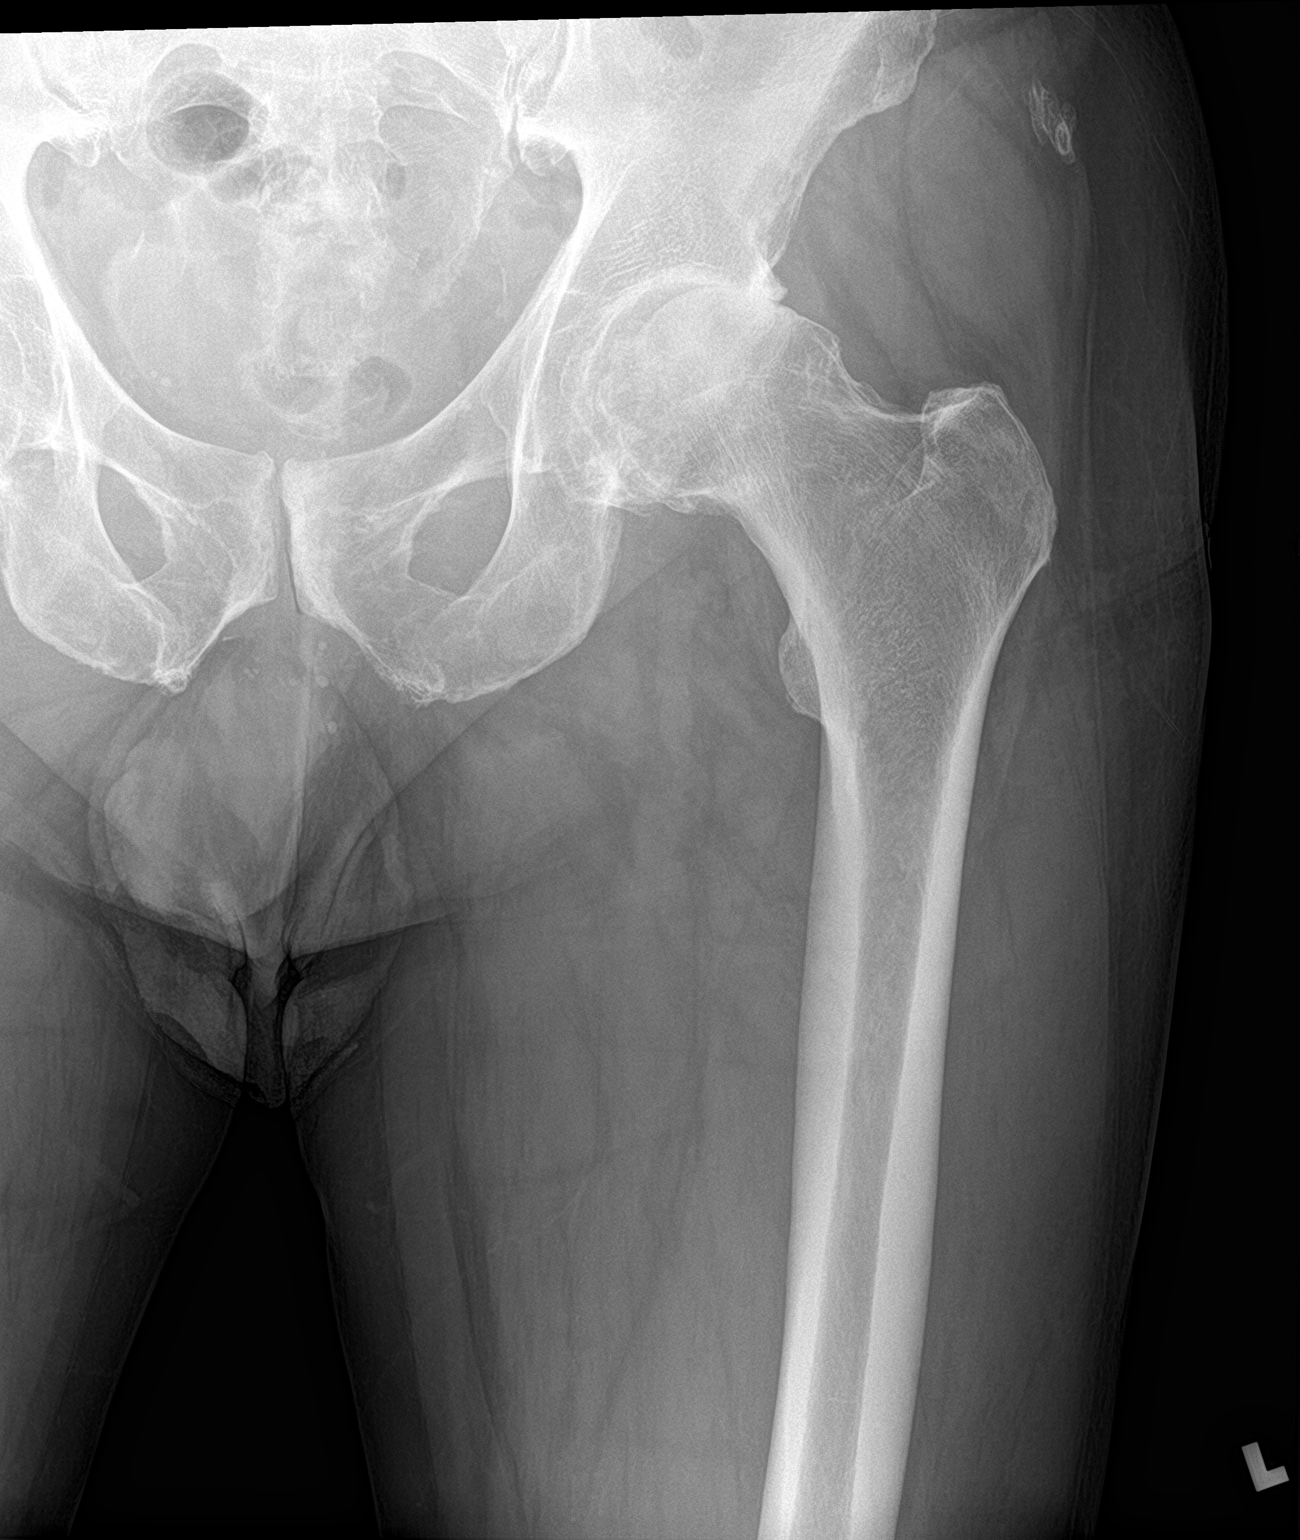

[hip lat]
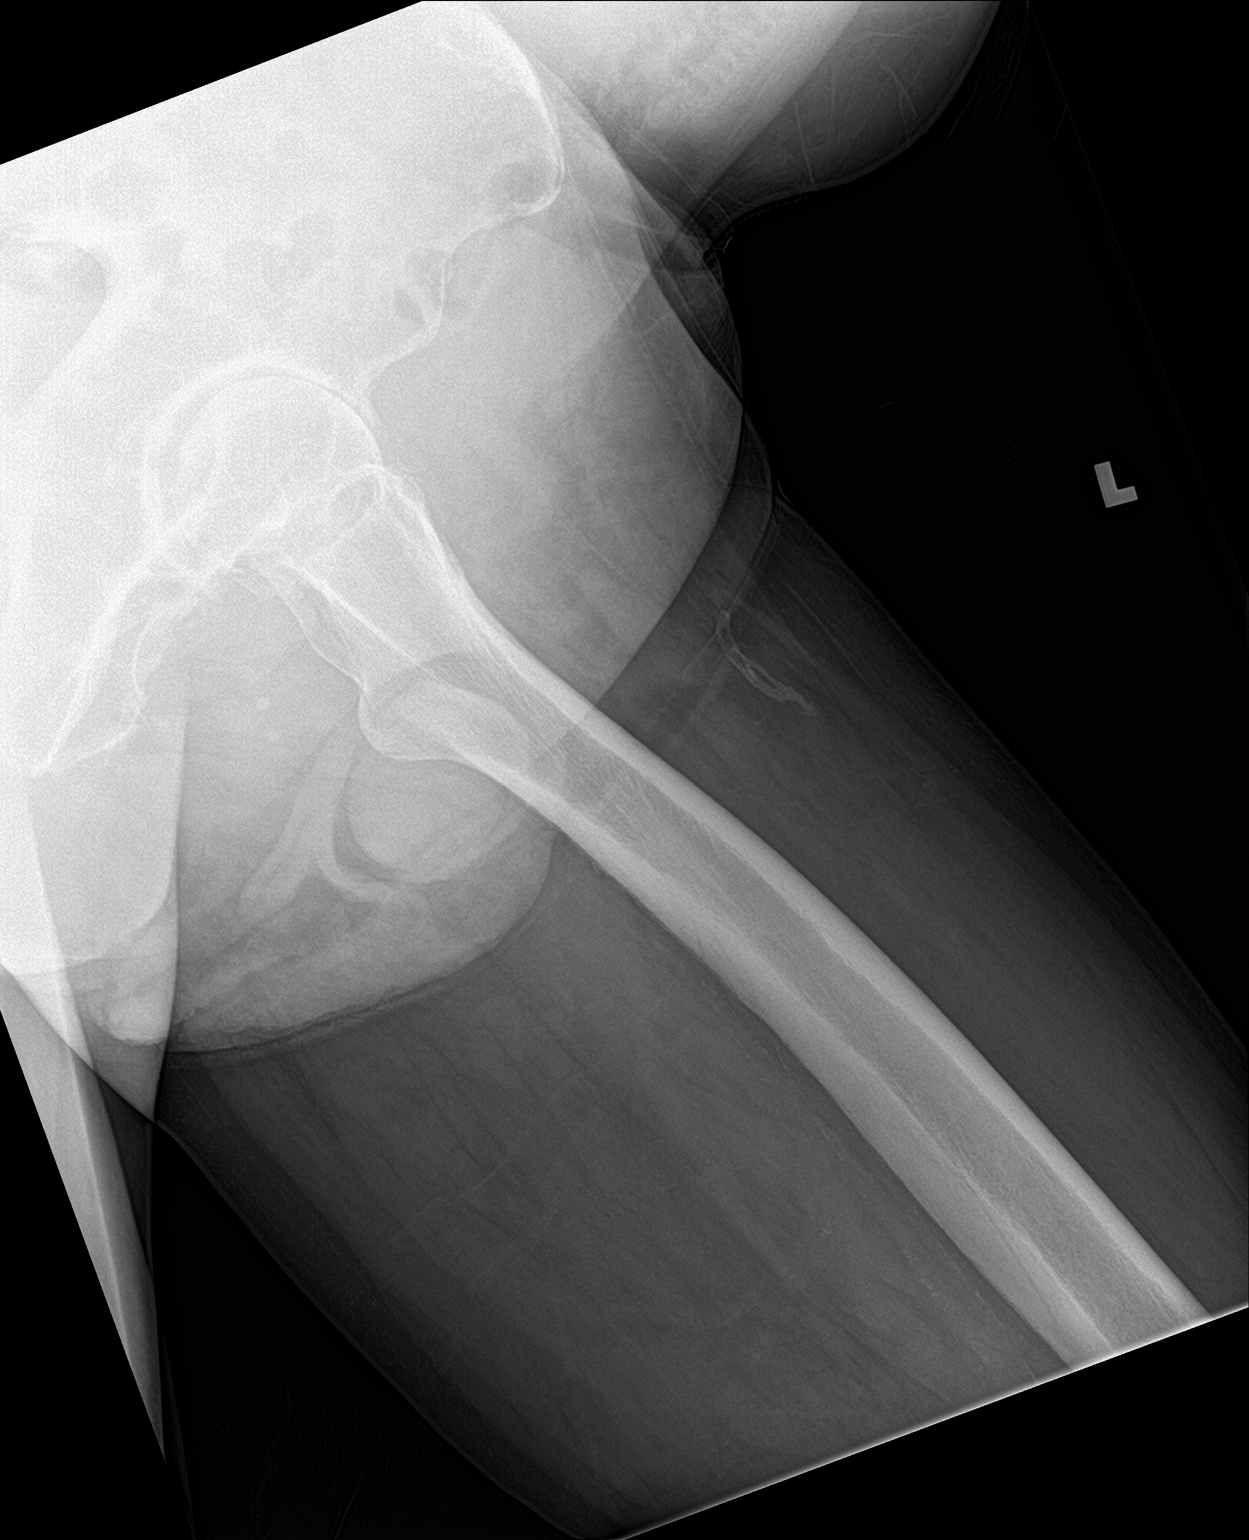

[3 of 3 positions shown; findings below may reference images not displayed]

FINDINGS: Significant degenerative changes in the left hip with near complete
loss of superior joint space and associated bony sclerosis. Mild
degenerative changes in the right hip without significant loss of
joint space. No fractures or dislocations. No other abnormalities.
IMPRESSION: Significant degenerative changes left hip with near complete loss of
superior joint space. Mild degenerative changes in the right hip
without significant loss of joint space.

## 2023-10-25 IMAGING — DX DG SHOULDER 2+V*L*
3 series · 3 of 3 positions shown · non-contrast
Comparison: None.

CLINICAL DATA: Shoulder pain for years.  No known injury.

EXAM:
LEFT SHOULDER - 2+ VIEW

[shoulder grashey]
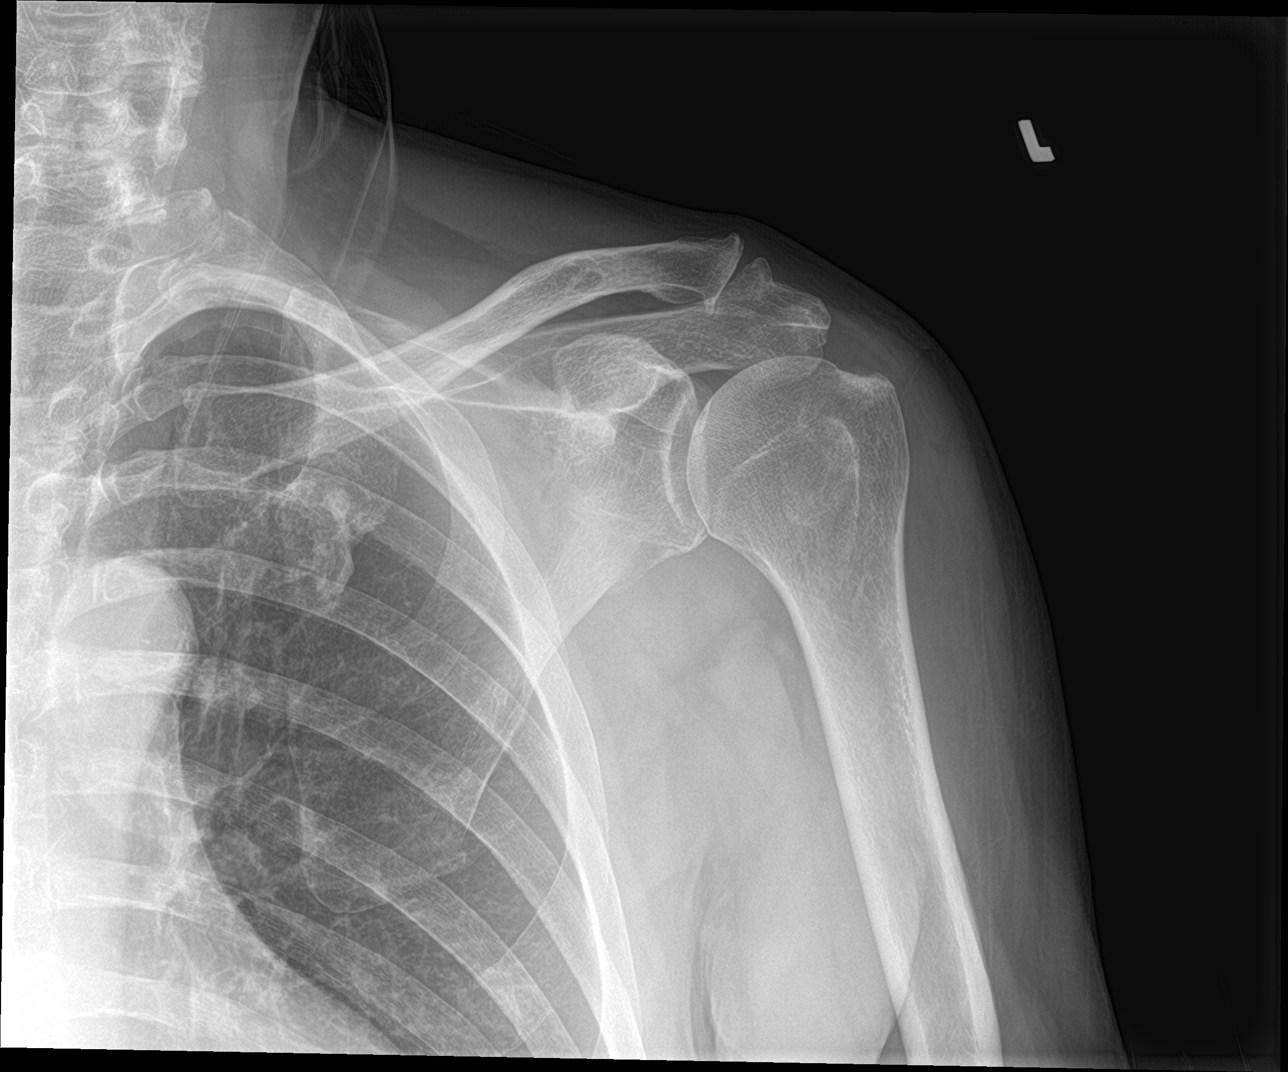

[shoulder y view]
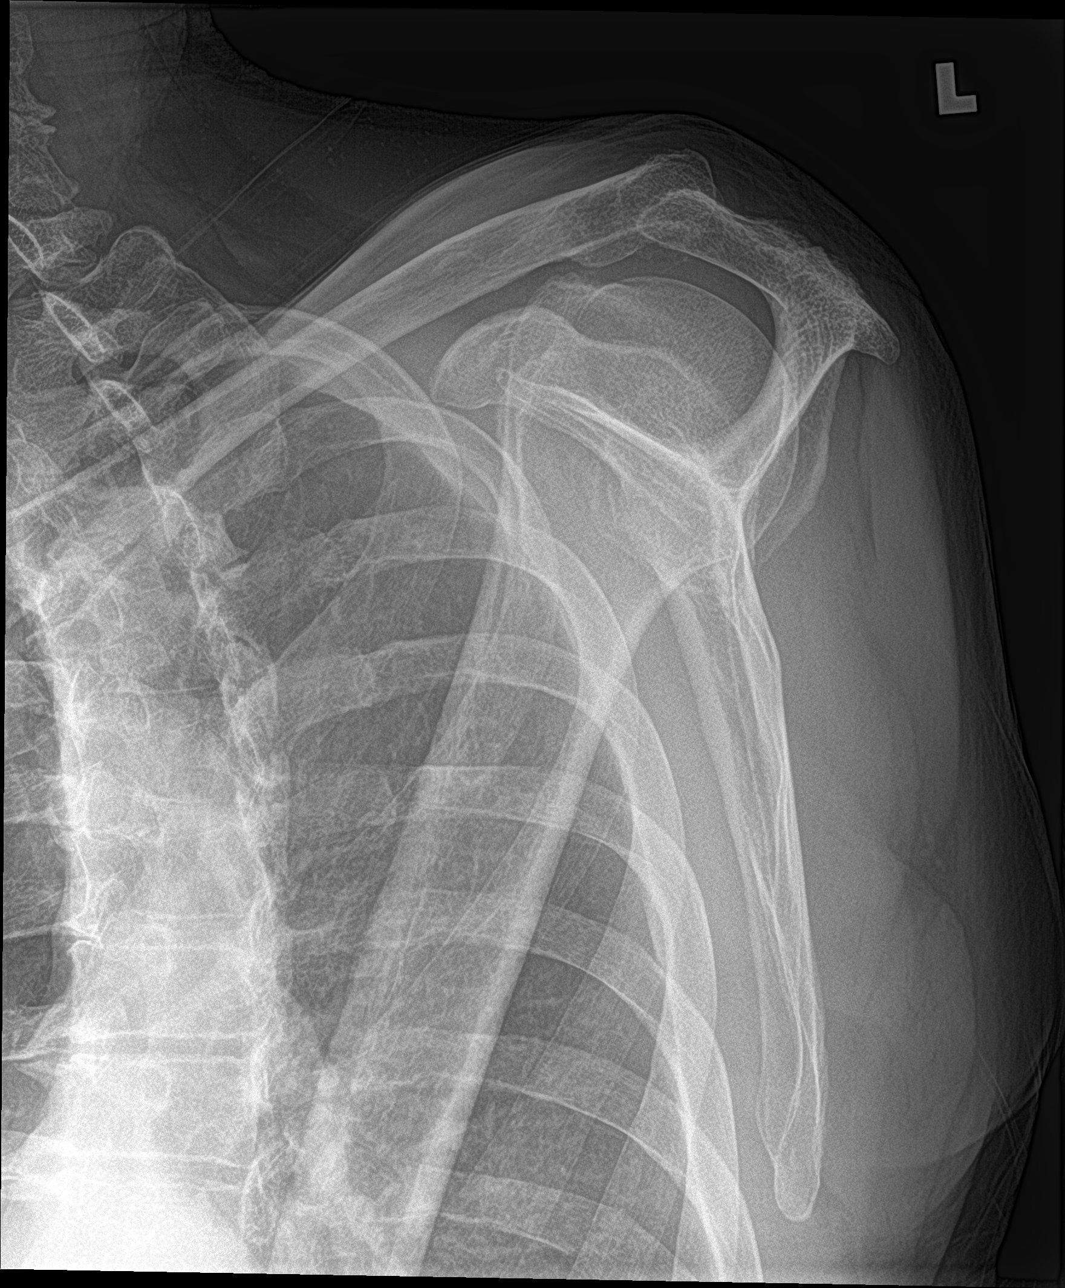

[shoulder axillary]
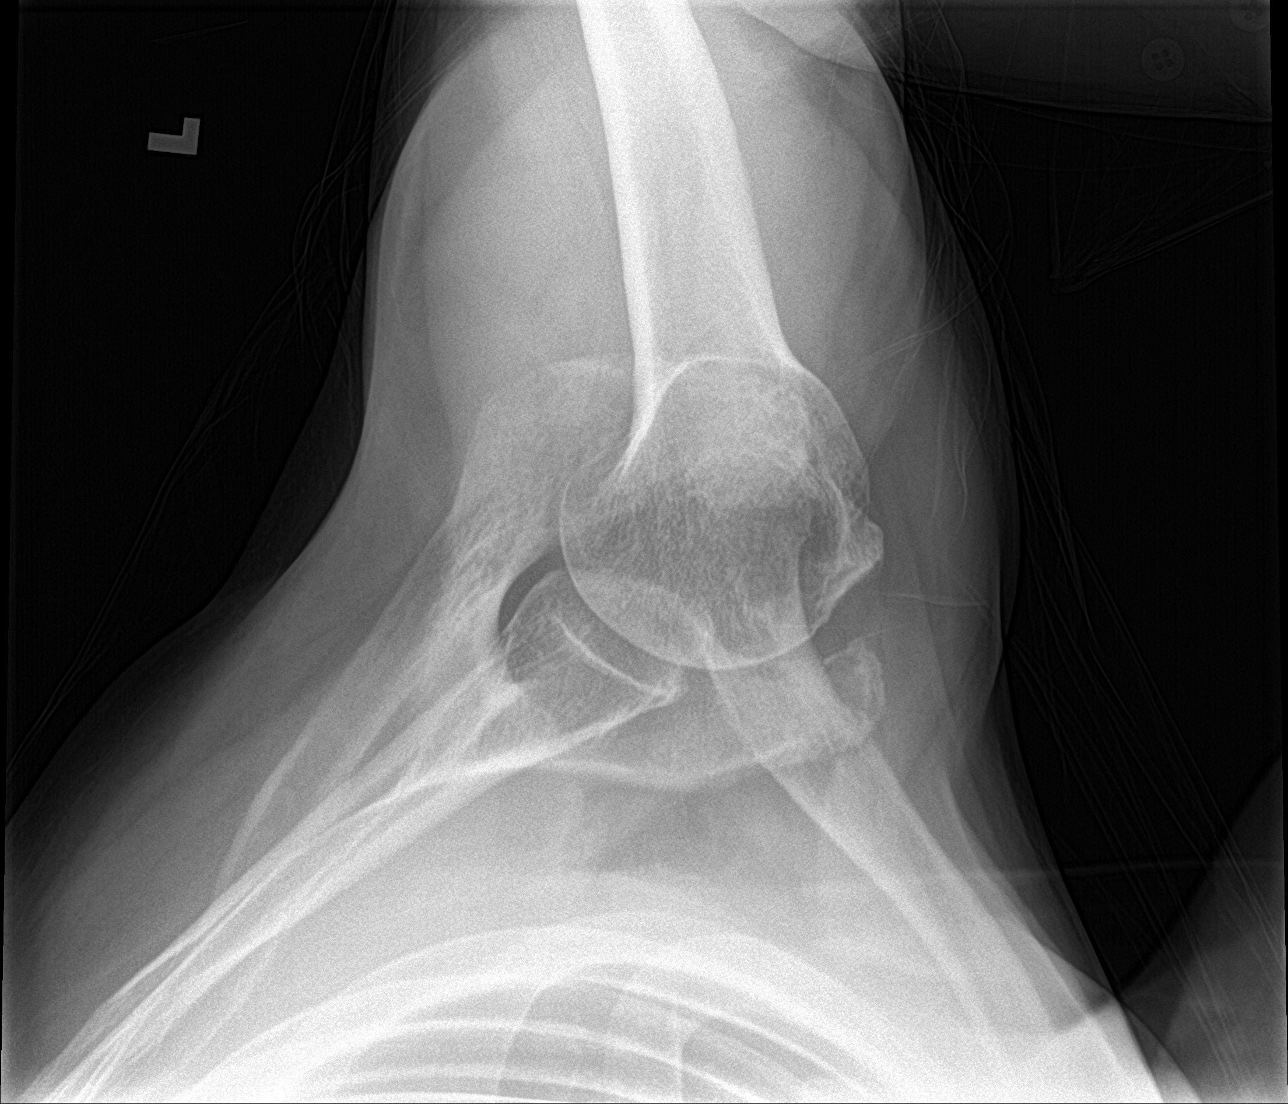

[3 of 3 positions shown; findings below may reference images not displayed]

FINDINGS: Benign lucent lesion in the distal third of the left clavicle with a
narrow zone transition. AC joint degenerative changes. Mild
sclerosis along the lateral left humeral head at the site of the
rotator cuff insertion. No other abnormalities.
IMPRESSION: 1. AC joint degenerative change.
2. Mild sclerosis at the rotator cuff insertion site on the lateral
humeral head suggests mild degenerative change in this region.
3. Benign lucent lesion in the distal third of the left clavicle.

## 2023-10-25 NOTE — Patient Instructions (Signed)
Description   Continue taking Warfarin 5mg  daily except 2.5mg  on Tuesdays.   Recheck INR in 5 weeks.  Pt started amio on 1/26 & stopped 08/23/23. Anticoagulation Clinic [phone]

## 2023-11-02 ENCOUNTER — Encounter: Payer: Self-pay | Admitting: Medical-Surgical

## 2023-11-02 ENCOUNTER — Ambulatory Visit: Payer: Medicare Other | Admitting: Medical-Surgical

## 2023-11-02 VITALS — BP 130/72 | HR 95 | Resp 20 | Ht 73.0 in | Wt 199.1 lb

## 2023-11-02 DIAGNOSIS — F17200 Nicotine dependence, unspecified, uncomplicated: Secondary | ICD-10-CM

## 2023-11-02 DIAGNOSIS — F418 Other specified anxiety disorders: Secondary | ICD-10-CM | POA: Diagnosis not present

## 2023-11-02 NOTE — Progress Notes (Signed)
        Established patient visit  History, exam, impression, and plan:  1. Anxiety with depression Larry Odonnell 77 year old male presenting today for follow-up on anxiety and depression.  Previously prescribed amitriptyline 50-100 mg nightly per his request.  He did take the 100 mg nightly for short period of time but decided to cut back to 50 mg nightly.  Tolerates the medication well without side effects and feels that it works well for him.  He notes that he does still have dreams at night that are like clips and videos of the past.  These are not as disturbing as they were before has been cognizant of recommendations for sparing use of clonazepam due to tolerance and dependence risk.  Has been able to cut back from daily use to only 3-4 times weekly on average.  Feels he is doing well on this regimen and would like to continue.  Denies SI/HI.  Continue amitriptyline 50 mg nightly.  Okay to use clonazepam sparingly as noted above.  2. Tobacco dependence Continues smoking, 1/2 ppd, lung cancer screening scheduled tomorrow. Quit in the past but is not at a point that he is ready to quit at this time.  Aware of resources to help with cessation and will let me know if he feels he needs prescription management to help.  Procedures performed this visit: None.  Return in about 6 months (around 05/01/2024) for mood follow up.  __________________________________ Thayer Ohm, DNP, APRN, FNP-BC Primary Care and Sports Medicine [facility]

## 2023-11-06 ENCOUNTER — Ambulatory Visit: Payer: Medicare Other

## 2023-11-06 DIAGNOSIS — F17210 Nicotine dependence, cigarettes, uncomplicated: Secondary | ICD-10-CM

## 2023-11-06 DIAGNOSIS — Z87891 Personal history of nicotine dependence: Secondary | ICD-10-CM

## 2023-11-06 DIAGNOSIS — Z122 Encounter for screening for malignant neoplasm of respiratory organs: Secondary | ICD-10-CM

## 2023-11-16 ENCOUNTER — Other Ambulatory Visit: Payer: Self-pay | Admitting: *Deleted

## 2023-11-16 DIAGNOSIS — I4891 Unspecified atrial fibrillation: Secondary | ICD-10-CM

## 2023-11-16 MED ORDER — WARFARIN SODIUM 5 MG PO TABS: ORAL_TABLET | ORAL | 0 refills | Status: DC

## 2023-11-16 NOTE — Telephone Encounter (Signed)
Prescription refill request received for warfarin Lov:  Larry Odonnell, 10/11/2023 Next INR check: 12/4 Warfarin tablet strength:  5mg 

## 2023-11-28 ENCOUNTER — Other Ambulatory Visit: Payer: Self-pay

## 2023-11-28 DIAGNOSIS — F17210 Nicotine dependence, cigarettes, uncomplicated: Secondary | ICD-10-CM

## 2023-11-28 DIAGNOSIS — Z87891 Personal history of nicotine dependence: Secondary | ICD-10-CM

## 2023-11-28 DIAGNOSIS — Z122 Encounter for screening for malignant neoplasm of respiratory organs: Secondary | ICD-10-CM

## 2023-11-29 ENCOUNTER — Ambulatory Visit: Payer: Medicare Other | Attending: Cardiovascular Disease

## 2023-11-29 DIAGNOSIS — Z5181 Encounter for therapeutic drug level monitoring: Secondary | ICD-10-CM | POA: Diagnosis not present

## 2023-11-29 DIAGNOSIS — I4891 Unspecified atrial fibrillation: Secondary | ICD-10-CM

## 2023-11-29 DIAGNOSIS — I824Y9 Acute embolism and thrombosis of unspecified deep veins of unspecified proximal lower extremity: Secondary | ICD-10-CM | POA: Diagnosis not present

## 2023-11-29 LAB — POCT INR: INR: 1.9 — AB (ref 2.0–3.0)

## 2023-11-29 NOTE — Patient Instructions (Addendum)
Description   Take 1.5 tablets today (7.5mg ) and then continue taking Warfarin 5mg  daily except 2.5mg  on Tuesdays.   Recheck INR in 4 weeks.  Pt started amio on 1/26 & stopped 08/23/23. Anticoagulation Clinic [phone]

## 2023-12-01 ENCOUNTER — Telehealth (HOSPITAL_BASED_OUTPATIENT_CLINIC_OR_DEPARTMENT_OTHER): Payer: Self-pay | Admitting: Acute Care

## 2023-12-29 ENCOUNTER — Ambulatory Visit: Payer: Medicare Other | Attending: Cardiovascular Disease | Admitting: *Deleted

## 2023-12-29 DIAGNOSIS — I2693 Single subsegmental pulmonary embolism without acute cor pulmonale: Secondary | ICD-10-CM | POA: Diagnosis not present

## 2023-12-29 DIAGNOSIS — Z5181 Encounter for therapeutic drug level monitoring: Secondary | ICD-10-CM

## 2023-12-29 DIAGNOSIS — I824Y9 Acute embolism and thrombosis of unspecified deep veins of unspecified proximal lower extremity: Secondary | ICD-10-CM

## 2023-12-29 DIAGNOSIS — I4891 Unspecified atrial fibrillation: Secondary | ICD-10-CM | POA: Diagnosis not present

## 2023-12-29 LAB — POCT INR: INR: 1.5 — AB (ref 2.0–3.0)

## 2023-12-29 NOTE — Patient Instructions (Addendum)
 Description   Take 7.5mg  (1.5 tablets) of warfarin today and tomorrow and then START taking warfarin 5mg  daily. Recheck INR in 2 weeks.  Pt started amio on 1/26 & stopped 08/23/23. Anticoagulation Clinic [phone]

## 2024-01-02 ENCOUNTER — Ambulatory Visit: Payer: Medicare Other | Admitting: Sports Medicine

## 2024-01-02 ENCOUNTER — Other Ambulatory Visit (INDEPENDENT_AMBULATORY_CARE_PROVIDER_SITE_OTHER): Payer: Medicare Other

## 2024-01-02 ENCOUNTER — Encounter: Payer: Self-pay | Admitting: Sports Medicine

## 2024-01-02 DIAGNOSIS — M1612 Unilateral primary osteoarthritis, left hip: Secondary | ICD-10-CM

## 2024-01-02 MED ORDER — TRIAMCINOLONE ACETONIDE 40 MG/ML IJ SUSP
40.0000 mg | Freq: Once | INTRAMUSCULAR | Status: AC
Start: 2024-01-02 — End: 2024-01-02
  Administered 2024-01-02: 40 mg via INTRAMUSCULAR

## 2024-01-02 NOTE — Assessment & Plan Note (Signed)
 Known left hip osteoarthritis last injected December 2023, repeat injection today, return to see me as needed.

## 2024-01-02 NOTE — Addendum Note (Signed)
 Addended by: Carren Rang A on: 01/02/2024 02:13 PM   Modules accepted: Orders

## 2024-01-02 NOTE — Progress Notes (Signed)
    Procedures performed today:    Procedure: Real-time Ultrasound Guided injection of the left hip joint Device: Samsung HS60  Verbal informed consent obtained.  Time-out conducted.  Noted no overlying erythema, induration, or other signs of local infection.  Skin prepped in a sterile fashion.  Local anesthesia: Topical Ethyl chloride.  With sterile technique and under real time ultrasound guidance: Arthritic joint noted, 1 cc Kenalog  40, 2 cc lidocaine , 2 cc bupivacaine injected easily Completed without difficulty  Advised to call if fevers/chills, erythema, induration, drainage, or persistent bleeding.  Images permanently stored and available for review in PACS.  Impression: Technically successful ultrasound guided injection.  Independent interpretation of notes and tests performed by another provider:   None.  Brief History, Exam, Impression, and Recommendations:    Primary osteoarthritis of left hip Known left hip osteoarthritis last injected December 2023, repeat injection today, return to see me as needed.    ____________________________________________ Debby PARAS. Curtis, M.D., ABFM., CAQSM., AME. Primary Care and Sports Medicine [facility]  Adjunct Professor of [facility] Medicine  [employer]  [employer], [employer]

## 2024-01-02 NOTE — Code Documentation (Signed)
 done

## 2024-01-12 ENCOUNTER — Ambulatory Visit: Payer: Medicare Other | Attending: Cardiovascular Disease

## 2024-01-12 DIAGNOSIS — I4891 Unspecified atrial fibrillation: Secondary | ICD-10-CM

## 2024-01-12 DIAGNOSIS — I824Y9 Acute embolism and thrombosis of unspecified deep veins of unspecified proximal lower extremity: Secondary | ICD-10-CM

## 2024-01-12 DIAGNOSIS — Z5181 Encounter for therapeutic drug level monitoring: Secondary | ICD-10-CM | POA: Diagnosis not present

## 2024-01-12 LAB — POCT INR: INR: 2 (ref 2.0–3.0)

## 2024-01-12 NOTE — Patient Instructions (Signed)
Continue taking warfarin 5mg  daily. Recheck INR in 6 weeks.  Anticoagulation Clinic [phone]

## 2024-01-17 ENCOUNTER — Other Ambulatory Visit: Payer: Self-pay | Admitting: Medical-Surgical

## 2024-01-17 ENCOUNTER — Other Ambulatory Visit: Payer: Self-pay | Admitting: Cardiology

## 2024-01-17 DIAGNOSIS — I4891 Unspecified atrial fibrillation: Secondary | ICD-10-CM

## 2024-01-18 NOTE — Telephone Encounter (Signed)
Warfarin 5mg  refill Afib Last INR 01/02/24 Last OV 10/11/23

## 2024-02-02 NOTE — Progress Notes (Signed)
 CARDIOLOGY CONSULT NOTE       Patient ID: Larry Odonnell MRN: [id number] DOB/AGE: 29-Aug-1946 78 y.o.   Referring Physician: Lalla Brothers Primary Physician: Christen Butter, NP Primary Cardiologist: Shari Odonnell new to me Reason for Consultation: PAF     HPI:  78 y.o. previously seen by Dr Shari Odonnell. Sees afib clinic and Dr Lalla Brothers. Had ablation 05/08/23. Still felt poorly in NSR. Amiodarone d/c Brain fog, fatgue and dizziness a bit better off amidarone  MRI head negative 09/03/23 Using coumadin for anticoagulation On Zestril/Toprol for BP. On statin. Anxiety/Depression using amitriptyline 50 mg at night and clonazepam 3-4 x/week. Smoking 1/2 ppd. Lung cancer CT negative 11/06/23 Did note aortic atherosclerosis and 3 vessel coronary calcium  Note calcium score was 806 on pre ablation CTA 05/04/23 Carotid duplex 08/31/23 plaque no stenosis. TEE 02/23/23 EF 60-65% mild MR  Seen in ED 08/26/23 for chest pain Noted history of PE  CT angio negative for PE and he was on coumadin with Rx INR at time   I had met him once before when I did his TEE/DCC 02/23/23 prior to his ablation.  He remarried 3 years ago "Larry Odonnell" has 2 step daughters local and his daughter in [place]. Larry Odonnell    ROS All other systems reviewed and negative except as noted above  Past Medical History:  Diagnosis Date   A-fib (HCC)    Allergy    Latex   Anxiety 2006   Cancer [facility]) 2012   Prostate   Cataract 2017   Surgery both eyes   Depression 2006   DVT (deep venous thrombosis) (HCC)    Encounter for monitoring warfarin therapy 07/22/2019   History of colon polyps    1 adenoma: 6- 2018   Hypertension    Kidney stones 01/08/2020   Mixed hyperlipidemia 08/09/2021   Peripheral neuropathy 08/02/2012   Primary osteoarthritis of left hip 11/23/2021   Pulmonary embolism (HCC)    Pulmonary emphysema (HCC) 11/07/2022   Pulmonary emphysema (HCC) 11/07/2022   Tobacco dependence 07/02/2019    Family History  Problem Relation Age of Onset    Anxiety disorder Mother    Cancer Mother    Stroke Mother    Alcohol abuse Father    Early death Father    Anxiety disorder Brother    Depression Brother    Anxiety disorder Daughter    Depression Daughter    Esophageal cancer Neg Hx     Social History   Socioeconomic History   Marital status: Married    Spouse name: Larry Odonnell   Number of children: 1   Years of education: 13   Highest education level: Some college, no degree  Occupational History   Occupation: Retired  Tobacco Use   Smoking status: Every Day    Current packs/day: 0.50    Average packs/day: 1.2 packs/day for 52.1 years (61.3 ttl pk-yrs)    Types: Cigarettes    Start date: 2020   Smokeless tobacco: Never   Tobacco comments:    Quit 1990-2015  Vaping Use   Vaping status: Never Used  Substance and Sexual Activity   Alcohol use: Not Currently    Comment: No alcohol since 1995   Drug use: Never   Sexual activity: Not Currently    Birth control/protection: None  Other Topics Concern   Not on file  Social History Narrative   Lives with wife. He enjoys yardwork.   Social Drivers of Health   Financial Resource Strain: Low Risk  (12/31/2023)  Overall Financial Resource Strain (CARDIA)    Difficulty of Paying Living Expenses: Not hard at all  Food Insecurity: No Food Insecurity (12/31/2023)   Hunger Vital Sign    Worried About Running Out of Food in the Last Year: Never true    Ran Out of Food in the Last Year: Never true  Transportation Needs: No Transportation Needs (12/31/2023)   PRAPARE - Administrator, Civil Service (Medical): No    Lack of Transportation (Non-Medical): No  Physical Activity: Insufficiently Active (12/31/2023)   Exercise Vital Sign    Days of Exercise per Week: 2 days    Minutes of Exercise per Session: 20 min  Stress: No Stress Concern Present (12/31/2023)   [employer] of Occupational Health - Occupational Stress Questionnaire    Feeling of Stress : Not at  all  Social Connections: Socially Integrated (12/31/2023)   Social Connection and Isolation Panel [NHANES]    Frequency of Communication with Friends and Family: Twice a week    Frequency of Social Gatherings with Friends and Family: Once a week    Attends Religious Services: More than 4 times per year    Active Member of [employer] or Organizations: Yes    Attends Banker Meetings: More than 4 times per year    Marital Status: Married  Catering manager Violence: Not At Risk (08/22/2022)   Humiliation, Afraid, Rape, and Kick questionnaire    Fear of Current or Ex-Partner: No    Emotionally Abused: No    Physically Abused: No    Sexually Abused: No    Past Surgical History:  Procedure Laterality Date   ATRIAL FIBRILLATION ABLATION N/A 05/08/2023   Procedure: ATRIAL FIBRILLATION ABLATION;  Surgeon: Lanier Prude, MD;  Location: [facility] INVASIVE CV LAB;  Service: Cardiovascular;  Laterality: N/A;   CARDIOVERSION N/A 07/21/2022   Procedure: CARDIOVERSION;  Surgeon: Wendall Stade, MD;  Location: [facility] ENDOSCOPY;  Service: Cardiovascular;  Laterality: N/A;   CARDIOVERSION N/A 02/23/2023   Procedure: CARDIOVERSION;  Surgeon: Wendall Stade, MD;  Location: [facility] ENDOSCOPY;  Service: Cardiovascular;  Laterality: N/A;   EYE SURGERY  2017   Cataracts   PROSTATECTOMY     TEE WITHOUT CARDIOVERSION N/A 02/23/2023   Procedure: TRANSESOPHAGEAL ECHOCARDIOGRAM (TEE);  Surgeon: Wendall Stade, MD;  Location: [facility] ENDOSCOPY;  Service: Cardiovascular;  Laterality: N/A;   TONSILLECTOMY        Current Outpatient Medications:    amitriptyline (ELAVIL) 50 MG tablet, TAKE 1 TO 2 TABLETS BY MOUTH AT  BEDTIME, Disp: 180 tablet, Rfl: 0   cholecalciferol (VITAMIN D) 25 MCG (1000 UNIT) tablet, Take 1,000 Units by mouth daily., Disp: , Rfl:    clonazePAM (KLONOPIN) 0.5 MG tablet, Take 1 tablet (0.5 mg total) by mouth daily as needed for anxiety., Disp: 30 tablet, Rfl: 2   lisinopril (ZESTRIL) 10 MG tablet, Take 1  tablet (10 mg total) by mouth daily., Disp: 90 tablet, Rfl: 2   Melatonin 10 MG TABS, Take 10 mg by mouth at bedtime., Disp: , Rfl:    metoprolol succinate (TOPROL-XL) 50 MG 24 hr tablet, Take 0.5 tablets (25 mg total) by mouth 2 (two) times daily. Take with or immediately following a meal. (Patient taking differently: Take 50 mg by mouth daily. Take with or immediately following a meal.), Disp: , Rfl:    rosuvastatin (CRESTOR) 10 MG tablet, Take 1 tablet (10 mg total) by mouth daily., Disp: 90 tablet, Rfl: 2   warfarin (COUMADIN) 1  MG tablet, TAKE 2 TABLETS BY MOUTH ONCE  DAILY ALONG WITH 5 MG TABLET OR  AS DIRECTED BY COUMADIN CLINIC (Patient taking differently: TAKE 2 TABLETS BY MOUTH ONCE  DAILY ALONG WITH 5 MG TABLET OR  AS DIRECTED BY COUMADIN CLINIC), Disp: 180 tablet, Rfl: 3   warfarin (COUMADIN) 5 MG tablet, TAKE 1 TABLET BY MOUTH  DAILY AS DIRECTED BY COUMADIN  CLINIC, Disp: 90 tablet, Rfl: 1    Physical Exam: Blood pressure (!) 164/82, pulse 93, height 6\' 1"  (1.854 m), weight 203 lb 6.4 oz (92.3 kg), SpO2 98%.    Affect appropriate Healthy:  appears stated age HEENT: normal Neck supple with no adenopathy JVP normal no bruits no thyromegaly Lungs clear with no wheezing and good diaphragmatic motion Heart:  S1/S2 no murmur, no rub, gallop or click PMI normal Abdomen: benighn, BS positve, no tenderness, no AAA no bruit.  No HSM or HJR Distal pulses intact with no bruits No edema Neuro non-focal Skin warm and dry No muscular weakness   Labs:   Lab Results  Component Value Date   WBC 7.3 08/26/2023   HGB 13.1 08/26/2023   HCT 39.2 08/26/2023   MCV 96.3 08/26/2023   PLT 175 08/26/2023   No results for input(s): "NA", "K", "CL", "CO2", "BUN", "CREATININE", "CALCIUM", "PROT", "BILITOT", "ALKPHOS", "ALT", "AST", "GLUCOSE" in the last 168 hours.  Invalid input(s): "LABALBU" No results found for: "CKTOTAL", "CKMB", "CKMBINDEX", "TROPONINI"  Lab Results  Component Value Date    CHOL 166 06/01/2021   CHOL 241 (H) 04/20/2021   Lab Results  Component Value Date   HDL 46 06/01/2021   HDL 44 04/20/2021   Lab Results  Component Value Date   LDLCALC 106 (H) 06/01/2021   LDLCALC 174 (H) 04/20/2021   Lab Results  Component Value Date   TRIG 76 06/01/2021   TRIG 128 04/20/2021   Lab Results  Component Value Date   CHOLHDL 3.6 06/01/2021   CHOLHDL 5.5 (H) 04/20/2021   No results found for: "LDLDIRECT"    Radiology: No results found.  EKG: SR rate 87 nonspecific ST/T wave changes    ASSESSMENT AND PLAN:   PAF.  CHADVASC 4 on coumadin INR;s Rx. Post ablation May 2024. Maintaining NSR f/u Dr Lalla Brothers HTN:  continue toprol and ACE HLD:  on statin crestor 10 mg need lipids with primary PE:  history of ER visit 08/26/23 negative CTA for recurrence Chest Pain:  Seen in ER 07/2023. Calcium score > 800 discussed need for stress testing PET/CT Anxiety /Depression: using amitriptyline lower dose at night and Klonopin  PET/CT  F/U in a year   Signed: Charlton Haws 02/13/2024, 9:33 AM

## 2024-02-13 ENCOUNTER — Ambulatory Visit: Payer: Medicare Other | Attending: Cardiovascular Disease | Admitting: Cardiovascular Disease

## 2024-02-13 ENCOUNTER — Encounter: Payer: Self-pay | Admitting: Cardiovascular Disease

## 2024-02-13 VITALS — BP 164/82 | HR 93 | Ht 73.0 in | Wt 203.4 lb

## 2024-02-13 DIAGNOSIS — I4891 Unspecified atrial fibrillation: Secondary | ICD-10-CM | POA: Diagnosis not present

## 2024-02-13 DIAGNOSIS — R072 Precordial pain: Secondary | ICD-10-CM

## 2024-02-13 DIAGNOSIS — R079 Chest pain, unspecified: Secondary | ICD-10-CM

## 2024-02-13 DIAGNOSIS — I2510 Atherosclerotic heart disease of native coronary artery without angina pectoris: Secondary | ICD-10-CM | POA: Diagnosis not present

## 2024-02-13 NOTE — Patient Instructions (Addendum)
 Medication Instructions:  Your physician recommends that you continue on your current medications as directed. Please refer to the Current Medication list given to you today.  *If you need a refill on your cardiac medications before your next appointment, please call your pharmacy*  Lab Work: If you have labs (blood work) drawn today and your tests are completely normal, you will receive your results only by: MyChart Message (if you have MyChart) OR A paper copy in the mail If you have any lab test that is abnormal or we need to change your treatment, we will call you to review the results.  Testing/Procedures: Your physician has requested that you have cardiac PET CT. Cardiac computed tomography (CT) is a painless test that uses an x-ray machine to take clear, detailed pictures of your heart. For further information please visit [email or web address] Please follow instruction sheet as given.  Follow-Up: At [facility], you and your health needs are our priority.  As part of our continuing mission to provide you with exceptional heart care, we have created designated Provider Care Teams.  These Care Teams include your primary Cardiologist (physician) and Advanced Practice Providers (APPs -  Physician Assistants and Nurse Practitioners) who all work together to provide you with the care you need, when you need it.  We recommend signing up for the patient portal called "MyChart".  Sign up information is provided on this After Visit Summary.  MyChart is used to connect with patients for Virtual Visits (Telemedicine).  Patients are able to view lab/test results, encounter notes, upcoming appointments, etc.  Non-urgent messages can be sent to your provider as well.   To learn more about what you can do with MyChart, go to [email or web address].    Your next appointment:   1 year(s)  Provider:   Charlton Haws, MD     Other Instructions    Please report to Radiology at the [facility] Main Entrance 30 minutes early for your test.  [address] [place], [place] [address]   How to Prepare for Your Cardiac PET/CT Stress Test:  Nothing to eat or drink, except water, 3 hours prior to arrival time.  NO caffeine/decaffeinated products, or chocolate 12 hours prior to arrival. (Please note decaffeinated beverages (teas/coffees) still contain caffeine).  If you have caffeine within 12 hours prior, the test will need to be rescheduled.  Medication instructions: Do not take erectile dysfunction medications for 72 hours prior to test (sildenafil, tadalafil) Do not take nitrates (isosorbide mononitrate, Ranexa) the day before or day of test Do not take tamsulosin the day before or morning of test Hold theophylline containing medications for 12 hours. Hold Dipyridamole 48 hours prior to the test.  You may take your remaining medications with water.  NO perfume, cologne or lotion on chest or abdomen area.  Total time is 1 to 2 hours; you may want to bring reading material for the waiting time.   In preparation for your appointment, medication and supplies will be purchased.  Appointment availability is limited, so if you need to cancel or reschedule, please call the Radiology Department Scheduler at [phone] 24 hours in advance to avoid a cancellation fee of $100.00  What to Expect When you Arrive:  Once you arrive and check in for your appointment, you will be taken to a preparation room within the Radiology Department.  A technologist or Nurse will obtain your medical history, verify that you are correctly prepped for the exam, and explain the  procedure.  Afterwards, an IV will be started in your arm and electrodes will be placed on your skin for EKG monitoring during the stress portion of the exam. Then you will be escorted to the PET/CT scanner.  There, staff will get you positioned on the scanner and obtain a blood pressure and EKG.  During the exam, you will  continue to be connected to the EKG and blood pressure machines.  A small, safe amount of a radioactive tracer will be injected in your IV to obtain a series of pictures of your heart along with an injection of a stress agent.    After your Exam:  It is recommended that you eat a meal and drink a caffeinated beverage to counter act any effects of the stress agent.  Drink plenty of fluids for the remainder of the day and urinate frequently for the first couple of hours after the exam.  Your doctor will inform you of your test results within 7-10 business days.  For more information and frequently asked questions, please visit our website: [email or web address]  For questions about your test or how to prepare for your test, please call: Cardiac Imaging Nurse Navigators Office: [phone]

## 2024-02-14 ENCOUNTER — Other Ambulatory Visit: Payer: Self-pay

## 2024-02-14 MED ORDER — LISINOPRIL 10 MG PO TABS: 10.0000 mg | ORAL_TABLET | Freq: Every day | ORAL | 3 refills | Status: DC

## 2024-02-21 ENCOUNTER — Other Ambulatory Visit: Payer: Self-pay | Admitting: Medical Genetics

## 2024-02-23 ENCOUNTER — Ambulatory Visit: Payer: Medicare Other | Attending: Internal Medicine

## 2024-02-23 DIAGNOSIS — I824Y9 Acute embolism and thrombosis of unspecified deep veins of unspecified proximal lower extremity: Secondary | ICD-10-CM | POA: Diagnosis not present

## 2024-02-23 DIAGNOSIS — I4891 Unspecified atrial fibrillation: Secondary | ICD-10-CM

## 2024-02-23 DIAGNOSIS — Z5181 Encounter for therapeutic drug level monitoring: Secondary | ICD-10-CM

## 2024-02-23 LAB — POCT INR: INR: 1.4 — AB (ref 2.0–3.0)

## 2024-02-23 NOTE — Patient Instructions (Signed)
 Take 2  tablets today only then Continue taking warfarin 5mg  daily. Recheck INR in 3 weeks.  Anticoagulation Clinic [phone]

## 2024-02-25 ENCOUNTER — Encounter: Payer: Self-pay | Admitting: Medical-Surgical

## 2024-02-25 DIAGNOSIS — F418 Other specified anxiety disorders: Secondary | ICD-10-CM

## 2024-02-27 MED ORDER — CLONAZEPAM 0.5 MG PO TABS
0.5000 mg | ORAL_TABLET | Freq: Every day | ORAL | 2 refills | Status: DC | PRN
Start: 2024-02-27 — End: 2024-10-03

## 2024-02-28 ENCOUNTER — Other Ambulatory Visit: Payer: Medicare Other

## 2024-02-28 ENCOUNTER — Other Ambulatory Visit: Payer: Self-pay

## 2024-02-28 MED ORDER — ROSUVASTATIN CALCIUM 10 MG PO TABS: 10.0000 mg | ORAL_TABLET | Freq: Every day | ORAL | 3 refills | Status: DC

## 2024-03-05 ENCOUNTER — Other Ambulatory Visit

## 2024-03-05 DIAGNOSIS — Z006 Encounter for examination for normal comparison and control in clinical research program: Secondary | ICD-10-CM

## 2024-03-09 ENCOUNTER — Encounter: Payer: Self-pay | Admitting: Cardiovascular Disease

## 2024-03-09 ENCOUNTER — Telehealth: Payer: Self-pay | Admitting: Physician Assistant

## 2024-03-09 NOTE — Telephone Encounter (Signed)
 Pt called stating his BP and HR have been elevated since yesterday. HR 130s-144 over 24  hrs. BP has been 91/66.  He is completely asymptomatic, but wanted to let us know about elevated HR. He states he does not want to go to the ER. PT states he is scheduled for a nuc PET.   He took 25 mg toprol last night. We discussed that it will be difficult to manage tachycardia and hypotension over the phone. He understands and would like to monitor symptoms.   Unfortunately INR was 1.4 about 2 weeks ago, so subtherapeutic, would not be a candidate for DCCV in the ER.   We discussed hydration to support BP and possibility of taking additional toprol after lunch. He would like Dr. Eden Emms to be aware.   Triage, can you see if he can get an appt on Monday with an APP or Afib clinic?   Marcelino Duster, PA-C 03/09/2024, 10:33 AM [phone] [facility] [address] [place], [place] [address]

## 2024-03-11 ENCOUNTER — Ambulatory Visit: Attending: Nurse Practitioner | Admitting: Nurse Practitioner

## 2024-03-11 ENCOUNTER — Encounter: Payer: Self-pay | Admitting: Nurse Practitioner

## 2024-03-11 VITALS — BP 100/84 | HR 155 | Ht 73.0 in | Wt 207.0 lb

## 2024-03-11 DIAGNOSIS — Z7901 Long term (current) use of anticoagulants: Secondary | ICD-10-CM

## 2024-03-11 DIAGNOSIS — E785 Hyperlipidemia, unspecified: Secondary | ICD-10-CM

## 2024-03-11 DIAGNOSIS — I483 Typical atrial flutter: Secondary | ICD-10-CM | POA: Diagnosis not present

## 2024-03-11 DIAGNOSIS — I4819 Other persistent atrial fibrillation: Secondary | ICD-10-CM | POA: Diagnosis not present

## 2024-03-11 DIAGNOSIS — I10 Essential (primary) hypertension: Secondary | ICD-10-CM

## 2024-03-11 DIAGNOSIS — Z8679 Personal history of other diseases of the circulatory system: Secondary | ICD-10-CM

## 2024-03-11 DIAGNOSIS — I2510 Atherosclerotic heart disease of native coronary artery without angina pectoris: Secondary | ICD-10-CM

## 2024-03-11 DIAGNOSIS — Z9889 Other specified postprocedural states: Secondary | ICD-10-CM

## 2024-03-11 MED ORDER — METOPROLOL SUCCINATE ER 50 MG PO TB24: 50.0000 mg | ORAL_TABLET | Freq: Two times a day (BID) | ORAL | Status: DC

## 2024-03-11 MED ORDER — LISINOPRIL 10 MG PO TABS: 5.0000 mg | ORAL_TABLET | Freq: Every day | ORAL | Status: DC

## 2024-03-11 NOTE — Telephone Encounter (Signed)
 Called the pt and scheduled him to see Eligha Bridegroom NP for today at 2:45 pm, as advised by Bettina Gavia PA-C.   Pt aware of appt today 3/17 at 2:45 pm here at [facility] office.  Pt aware to arrive 10-15 mins prior to this visit.   Will send this message to Dr. Fabio Bering covering RN as an Lorain Childes.

## 2024-03-11 NOTE — Progress Notes (Signed)
 Cardiology Office Note:  .   Date:  03/11/2024  ID:  Larry Odonnell, DOB [birth year], MRN [id number] PCP: Christen Butter, NP  [facility] Providers Cardiologist:  Charlton Haws, MD Electrophysiologist:  Lanier Prude, MD    Patient Profile: .      PMH Atrial fibrillation S/p ablation 05/08/2023 Hypertension Hyperlipidemia Tobacco dependence Coronary artery disease CAC Score 806 on CTA 05/04/2023 Aortic atherosclerosis Pulmonary embolus  History of a fib s/p ablation 05/08/2023, still felt poorly in NSR. He did not tolerate amiodarone due to brain fog, fatigue, and dizziness.  These improved once he stopped amiodarone.  MRI of head on 09/03/2023 was negative.  He has been on Coumadin for anticoagulation.  He takes amitriptyline 50 mg at night and clonazepam 3-4 times a week for anxiety and depression.  He continues to smoke one half PPD.  Lung cancer CT - 11/06/2023.  Aortic atherosclerosis and three-vessel coronary calcification noted on CT. Calcium score 806 on pre ablation CTA 05/04/2023. Carotid duplex 08/31/2023 revealed some plaque, no stenosis.  TEE 02/23/2023 with EF 60 to 65%, mild MR.  Seen in ED 08/26/2023 for chest pain.  He has history of PE.  CT angio negative for PE.  He was on Coumadin with therapeutic INR at the time.  Last cardiology clinic visit was 02/13/2024 with Dr. Eden Emms.  He was maintaining sinus rhythm at 87 bpm.  He was advised to undergo cardiac PET CT for evaluation of ischemia given significantly elevated calcium score.  BP was elevated.  He was advised to continue Toprol and lisinopril. One year follow-up was recommended.   Patient contacted our on-call provider on 03/09/2024 to report HR 130s to 144 over the previous 24 hours.  BP 91/66.  He reported that he was asymptomatic but was concerned about elevated HR and did not want to go to ER.  He had taken Toprol-XL 25 mg the evening prior.  Unfortunately, his most recent INR was 1.4 so he was subtherapeutic and  would not be a candidate for DCCV in the ER.  He was advised to hydrate well to support BP and possibly take an additional Toprol after lunch.  He was subsequently scheduled for office visit.        History of Present Illness: .   Larry Odonnell is a very pleasant 78 y.o. male who is here today for evaluation of tachycardia noted on home Kardia monitor. He reports he has been feeling well. He decided to check his heart rhythm randomly a few days ago and found HR to be in the 140s.  His wife wanted him to go to the ER but he called our on-call provider as noted above.  His wife wanted him to go to the ER but he called our on-call provider as noted above.  Reports he was not feeling any chest discomfort, palpitations, shortness of breath, fatigue, or edema. He has felt well overall since discontinuing amiodarone which caused severe dizziness and falls. He he took 1 dose of additional metoprolol over the past 2 days.  He denies lightheadedness, presyncope, or syncope.  Discussed the use of AI scribe software for clinical note transcription with the patient, who gave verbal consent to proceed.   ROS: See HPI       Studies Reviewed: Marland Kitchen   EKG Interpretation Date/Time:  Monday March 11 2024 14:41:08 EDT Ventricular Rate:  155 PR Interval:  122 QRS Duration:  82 QT Interval:  246 QTC Calculation: 395 R Axis:  1  Text Interpretation: Sinus tachycardia  or atrial flutter Nonspecific T wave abnormality When compared with ECG of 11-Oct-2023 13:57, Vent. rate has increased BY  68 BPM Nonspecific T wave abnormality, worse in Lateral leads Confirmed by Eligha Bridegroom [phone]) on 03/11/2024 5:49:34 PM    No results found for: "LIPOA"   Risk Assessment/Calculations:    CHA2DS2-VASc Score = 4   This indicates a 4.8% annual risk of stroke. The patient's score is based upon: CHF History: 0 HTN History: 1 Diabetes History: 0 Stroke History: 0 Vascular Disease History: 1 Age Score: 2 Gender Score:  0            Physical Exam:   VS:  BP 100/84   Pulse (!) 155   Ht 6\' 1"  (1.854 m)   Wt 207 lb (93.9 kg)   SpO2 95%   BMI 27.31 kg/m    Wt Readings from Last 3 Encounters:  03/11/24 207 lb (93.9 kg)  02/13/24 203 lb 6.4 oz (92.3 kg)  11/02/23 199 lb 1.3 oz (90.3 kg)    GEN: Well nourished, well developed in no acute distress NECK: No JVD; No carotid bruits CARDIAC: RRR, no murmurs, rubs, gallops RESPIRATORY:  Clear to auscultation without rales, wheezing or rhonchi  ABDOMEN: Soft, non-tender, non-distended EXTREMITIES:  No edema; No deformity     ASSESSMENT AND PLAN: .    Tachycardia/Atrial flutter: EKG reveals what appears to be atrial flutter at 155 bpm. Dr. Elease Hashimoto, DOD, attempted carotid massage to see if we could induce changes consistent with SVT but the rhythm did not break.  Patient is asymptomatic.  He has not noted increased chest discomfort, SOB, fatigue, or edema.  We discussed potential treatment options including DCCV.  INR on 02/23/2024 was subtherapeutic.  He is due for repeat INR on 03/15/2024.  Continue Coumadin for stroke prevention for CHA2DS2-VASc score of 4. We will increase metoprolol succinate to 50 mg twice daily.  Advised him to reduce lisinopril to give him higher BP on increased dose of BB as BP is soft.  Will route note to Dr. Lalla Brothers, EP cardiologist, for further advice.  Atrial fibrillation on chronic anticoagulation: He underwent A-fib ablation on 05/08/2023 and has thought to have remained in sinus rhythm until 2 days ago when he checked Kardia monitor. He was asymptomatic but randomly wanted to check his rhythm.  HR was in the 140s.  He contacted our on call provider and was advised to take additional beta-blocker.  EKG today reveals what appears to be atrial flutter at 155 bpm. He remains asymptomatic. We are increasing metoprolol succinate to 50 mg twice daily. BP is soft; will have him reduce lisinopril dose accordingly. Will await INR recheck on 3/21 as  he was subtherapeutic on 2/28. Will route to Dr. Lalla Brothers for his awareness and additional treatment recommendations.  No missed doses of Coumadin to his awareness.  CAD: No chest pain, shortness of breath, or other symptoms concerning for angina.Marland Kitchen He is awaiting cardiac PET CT as advised by Dr. Eden Emms, primary cardiologist, for ischemia evaluation evaluation based on elevated CAC score.   Hypertension: BP is soft today with similar home readings.  In the setting of increasing beta-blocker for HR control, we will have him reduce lisinopril to 5 mg daily.  Discontinue lisinopril if SBP < 100 mmHg.   Hyperlipidemia LDL goal < 70: Not addressed today. Needs updated lipid panel.        Disposition:TBD   Signed, Eligha Bridegroom, NP-C

## 2024-03-11 NOTE — Patient Instructions (Signed)
 Medication Instructions:   INCREASE Toprol one (1) tablet by mouth ( 50 mg) twice daily.   DECREASE Lisinopril one half (0.5) tablet by mouth ( 5 mg) daily, if systolic ( top number) of BP is <100 or lower hold your lisinopril.  *If you need a refill on your cardiac medications before your next appointment, please call your pharmacy*   Lab Work:  None ordered.  If you have labs (blood work) drawn today and your tests are completely normal, you will receive your results only by: MyChart Message (if you have MyChart) OR A paper copy in the mail If you have any lab test that is abnormal or we need to change your treatment, we will call you to review the results.   Testing/Procedures:  None ordered.    Follow-Up: At [facility], you and your health needs are our priority.  As part of our continuing mission to provide you with exceptional heart care, we have created designated Provider Care Teams.  These Care Teams include your primary Cardiologist (physician) and Advanced Practice Providers (APPs -  Physician Assistants and Nurse Practitioners) who all work together to provide you with the care you need, when you need it.  We recommend signing up for the patient portal called "MyChart".  Sign up information is provided on this After Visit Summary.  MyChart is used to connect with patients for Virtual Visits (Telemedicine).  Patients are able to view lab/test results, encounter notes, upcoming appointments, etc.  Non-urgent messages can be sent to your provider as well.   To learn more about what you can do with MyChart, go to [email or web address].    Your next appointment:   To Be Determined.    Provider:   Steffanie Dunn, MD or Charlton Haws, MD    Other Instructions  The office will get in touch with you to schedule next appointment.

## 2024-03-11 NOTE — Telephone Encounter (Signed)
 Pt will be seeing Eligha Bridegroom NP today at 2:45 pm, for these complaints.   See telenote from this morning for further details.

## 2024-03-13 LAB — GENECONNECT MOLECULAR SCREEN: Genetic Analysis Overall Interpretation: NEGATIVE

## 2024-03-15 ENCOUNTER — Ambulatory Visit: Payer: Medicare Other | Attending: Cardiology

## 2024-03-15 DIAGNOSIS — Z5181 Encounter for therapeutic drug level monitoring: Secondary | ICD-10-CM | POA: Diagnosis not present

## 2024-03-15 DIAGNOSIS — I4891 Unspecified atrial fibrillation: Secondary | ICD-10-CM

## 2024-03-15 DIAGNOSIS — I824Y9 Acute embolism and thrombosis of unspecified deep veins of unspecified proximal lower extremity: Secondary | ICD-10-CM | POA: Diagnosis not present

## 2024-03-15 DIAGNOSIS — I483 Typical atrial flutter: Secondary | ICD-10-CM

## 2024-03-15 DIAGNOSIS — I4819 Other persistent atrial fibrillation: Secondary | ICD-10-CM

## 2024-03-15 LAB — POCT INR: INR: 1.8 — AB (ref 2.0–3.0)

## 2024-03-15 NOTE — Patient Instructions (Signed)
 Take another 0.5 tablet today then Increase to  5 mg Daily, except 7.5 mg on Mondays, Wednesdays and Fridays. Recheck INR in 2 weeks.  Anticoagulation Clinic [phone]

## 2024-03-20 ENCOUNTER — Other Ambulatory Visit: Payer: Self-pay | Admitting: Nurse Practitioner

## 2024-03-20 DIAGNOSIS — I4892 Unspecified atrial flutter: Secondary | ICD-10-CM

## 2024-03-20 NOTE — Telephone Encounter (Signed)
 Called patient and advised I did not receive prior message, on 03/15/24 and that he came to mind today. He continues to have HR > 130s. BP has been soft (80s to 90s systolic) and he has reduced lisinopril to 5 mg daily but is having difficulty cutting the pills in half. I advised him to stop lisinopril unless SBP > 120 mmHg. He would need 4 INR within therapeutic range prior to cardioversion, therefore we will need to consider TEE prior. He would like to proceed with TEE cardioversion as recommended by Dr. Eden Emms. Risks of procedure reviewed and he agrees to proceed.  Advised that someone from our office will contact him regarding scheduling.  Informed Consent   Shared Decision Making/Informed Consent   The risks [stroke, cardiac arrhythmias rarely resulting in the need for a temporary or permanent pacemaker, skin irritation or burns, esophageal damage, perforation (1:10,000 risk), bleeding, pharyngeal hematoma as well as other potential complications associated with conscious sedation including aspiration, arrhythmia, respiratory failure and death], benefits (treatment guidance, restoration of normal sinus rhythm, diagnostic support) and alternatives of a transesophageal echocardiogram guided cardioversion were discussed in detail with Mr. Wiltgen and he is willing to proceed.

## 2024-03-20 NOTE — Addendum Note (Signed)
 Addended by: Bertram Millard on: 03/20/2024 12:32 PM   Modules accepted: Orders

## 2024-03-20 NOTE — Telephone Encounter (Addendum)
 I spoke with the pt and scheduled his TEE/DCCV for this Friday 03/22/24 with Dr Jens Som for 9 am arrival.. he verbalized understanding of his instructions and will have his labs drawn today.   Instructions also sent to his My Chart via a letter. Pt says someone will be able to drive him and stay with him post procedure.   Sent to precert.

## 2024-03-21 LAB — BASIC METABOLIC PANEL WITH GFR
BUN/Creatinine Ratio: 11 (ref 10–24)
BUN: 15 mg/dL (ref 8–27)
CO2: 22 mmol/L (ref 20–29)
Calcium: 9.6 mg/dL (ref 8.6–10.2)
Chloride: 102 mmol/L (ref 96–106)
Creatinine, Ser: 1.39 mg/dL — ABNORMAL HIGH (ref 0.76–1.27)
Glucose: 91 mg/dL (ref 70–99)
Potassium: 4.2 mmol/L (ref 3.5–5.2)
Sodium: 138 mmol/L (ref 134–144)
eGFR: 52 mL/min/{1.73_m2} — ABNORMAL LOW (ref >59)

## 2024-03-21 LAB — CBC
Hematocrit: 44 % (ref 37.5–51.0)
Hemoglobin: 14.4 g/dL (ref 13.0–17.7)
MCH: 30.6 pg (ref 26.6–33.0)
MCHC: 32.7 g/dL (ref 31.5–35.7)
MCV: 93 fL (ref 79–97)
Platelets: 217 10*3/uL (ref 150–450)
RBC: 4.71 x10E6/uL (ref 4.14–5.80)
RDW: 12.8 % (ref 11.6–15.4)
WBC: 7.6 10*3/uL (ref 3.4–10.8)

## 2024-03-21 LAB — PROTIME-INR
INR: 2.3 — ABNORMAL HIGH (ref 0.9–1.2)
Prothrombin Time: 24.3 s — ABNORMAL HIGH (ref 9.1–12.0)

## 2024-03-22 ENCOUNTER — Encounter (HOSPITAL_COMMUNITY)
Admission: RE | Disposition: A | Discharge status: Home / Self Care | Payer: Self-pay | Source: Home / Self Care | Attending: Cardiology

## 2024-03-22 ENCOUNTER — Encounter (HOSPITAL_COMMUNITY): Payer: Self-pay | Admitting: Cardiology

## 2024-03-22 ENCOUNTER — Ambulatory Visit (HOSPITAL_COMMUNITY): Admitting: Anesthesiology

## 2024-03-22 ENCOUNTER — Ambulatory Visit (HOSPITAL_COMMUNITY)

## 2024-03-22 ENCOUNTER — Ambulatory Visit (HOSPITAL_COMMUNITY)
Admission: RE | Admit: 2024-03-22 | Discharge: 2024-03-22 | Disposition: A | Discharge status: Home / Self Care | Attending: Cardiology | Admitting: Cardiology

## 2024-03-22 ENCOUNTER — Other Ambulatory Visit: Payer: Self-pay

## 2024-03-22 DIAGNOSIS — I2510 Atherosclerotic heart disease of native coronary artery without angina pectoris: Secondary | ICD-10-CM | POA: Insufficient documentation

## 2024-03-22 DIAGNOSIS — I483 Typical atrial flutter: Secondary | ICD-10-CM | POA: Diagnosis present

## 2024-03-22 DIAGNOSIS — I4891 Unspecified atrial fibrillation: Secondary | ICD-10-CM | POA: Diagnosis not present

## 2024-03-22 DIAGNOSIS — Z7901 Long term (current) use of anticoagulants: Secondary | ICD-10-CM | POA: Insufficient documentation

## 2024-03-22 DIAGNOSIS — Z79899 Other long term (current) drug therapy: Secondary | ICD-10-CM | POA: Diagnosis not present

## 2024-03-22 DIAGNOSIS — I361 Nonrheumatic tricuspid (valve) insufficiency: Secondary | ICD-10-CM

## 2024-03-22 DIAGNOSIS — Z87891 Personal history of nicotine dependence: Secondary | ICD-10-CM | POA: Insufficient documentation

## 2024-03-22 DIAGNOSIS — E785 Hyperlipidemia, unspecified: Secondary | ICD-10-CM | POA: Diagnosis not present

## 2024-03-22 DIAGNOSIS — I340 Nonrheumatic mitral (valve) insufficiency: Secondary | ICD-10-CM | POA: Diagnosis not present

## 2024-03-22 DIAGNOSIS — R000 Tachycardia, unspecified: Secondary | ICD-10-CM | POA: Insufficient documentation

## 2024-03-22 DIAGNOSIS — J449 Chronic obstructive pulmonary disease, unspecified: Secondary | ICD-10-CM | POA: Diagnosis not present

## 2024-03-22 DIAGNOSIS — I10 Essential (primary) hypertension: Secondary | ICD-10-CM

## 2024-03-22 DIAGNOSIS — I700 Atherosclerosis of aorta: Secondary | ICD-10-CM | POA: Diagnosis not present

## 2024-03-22 DIAGNOSIS — I4892 Unspecified atrial flutter: Secondary | ICD-10-CM

## 2024-03-22 DIAGNOSIS — E782 Mixed hyperlipidemia: Secondary | ICD-10-CM

## 2024-03-22 HISTORY — PX: CARDIOVERSION: EP1203

## 2024-03-22 HISTORY — PX: TRANSESOPHAGEAL ECHOCARDIOGRAM (CATH LAB): EP1270

## 2024-03-22 LAB — ECHO TEE

## 2024-03-22 LAB — PROTIME-INR
INR: 2.2 — ABNORMAL HIGH (ref 0.8–1.2)
Prothrombin Time: 25 s — ABNORMAL HIGH (ref 11.4–15.2)

## 2024-03-22 SURGERY — CARDIOVERSION (CATH LAB)
Anesthesia: Monitor Anesthesia Care

## 2024-03-22 MED ORDER — PROPOFOL 500 MG/50ML IV EMUL
INTRAVENOUS | Status: DC | PRN
Start: 2024-03-22 — End: 2024-03-22
  Administered 2024-03-22 (×3): 50 mg via INTRAVENOUS

## 2024-03-22 MED ORDER — SODIUM CHLORIDE 0.9% FLUSH: 3.0000 mL | Freq: Two times a day (BID) | INTRAVENOUS | Status: DC

## 2024-03-22 MED ORDER — SODIUM CHLORIDE 0.9% FLUSH: 3.0000 mL | INTRAVENOUS | Status: DC | PRN

## 2024-03-22 MED ORDER — PHENYLEPHRINE HCL (PRESSORS) 10 MG/ML IV SOLN
INTRAVENOUS | Status: DC | PRN
Start: 2024-03-22 — End: 2024-03-22
  Administered 2024-03-22: 160 ug via INTRAVENOUS

## 2024-03-22 MED ORDER — SODIUM CHLORIDE 0.9 % IV SOLN: INTRAVENOUS | Status: DC | PRN

## 2024-03-22 SURGICAL SUPPLY — 1 items: PAD DEFIB RADIO PHYSIO CONN (PAD) ×1 IMPLANT

## 2024-03-22 NOTE — Progress Notes (Signed)
     Transesophageal Echocardiogram Note  Larry Odonnell [id number] 06-30-1946  Procedure: Transesophageal Echocardiogram Indications: Atrial fibrillation  Procedure Details Consent: Obtained Time Out: Verified patient identification, verified procedure, site/side was marked, verified correct patient position, special equipment/implants available, Radiology Safety Procedures followed,  medications/allergies/relevent history reviewed, required imaging and test results available.  Performed  Medications:  Pt sedated by anesthesia with diprovan mg IV total.  Normal LV function; no LAA thrombus; mild MR and TR.  Pt subsequently underwent DCCV with 200J to sinus rhythm; no immediate complications; continue coumdin.   Complications: No apparent complications Patient did tolerate procedure well.  [name], MD

## 2024-03-22 NOTE — Transfer of Care (Addendum)
 Immediate Anesthesia Transfer of Care Note  Patient: Larry Odonnell  Procedure(s) Performed: CARDIOVERSION TRANSESOPHAGEAL ECHOCARDIOGRAM  Patient Location: PACU  Anesthesia Type:General  Level of Consciousness: awake, alert , oriented, and drowsy  Airway & Oxygen Therapy: Patient Spontanous Breathing and Patient connected to nasal cannula oxygen  Post-op Assessment: Report given to RN and Post -op Vital signs reviewed and stable  Post vital signs: Reviewed and stable  Last Vitals:  Vitals Value Taken Time  BP    Temp    Pulse    Resp    SpO2      Last Pain:  Vitals:   03/22/24 0940  TempSrc:   PainSc: 0-No pain         Complications: No notable events documented.

## 2024-03-22 NOTE — Interval H&P Note (Signed)
 History and Physical Interval Note:  03/22/2024 9:18 AM  Larry Odonnell  has presented today for surgery, with the diagnosis of AFLUTTER.  The various methods of treatment have been discussed with the patient and family. After consideration of risks, benefits and other options for treatment, the patient has consented to  Procedure(s): CARDIOVERSION (N/A) TRANSESOPHAGEAL ECHOCARDIOGRAM (N/A) as a surgical intervention.  The patient's history has been reviewed, patient examined, no change in status, stable for surgery.  I have reviewed the patient's chart and labs.  Questions were answered to the patient's satisfaction.     [name]

## 2024-03-22 NOTE — H&P (Signed)
 Office Visit 03/11/2024 [facility]    [place], Zachary George, NP Cardiology Primary hypertension +6 more Dx Referred by Christen Butter, NP Reason for Visit   Additional Documentation  Vitals: BP 100/84   Pulse 155 Important    Ht 6\' 1"  (1.854 m)   Wt 93.9 kg   SpO2 95%   BMI 27.31 kg/m   BSA 2.2 m  Flowsheets: CHADS2-VASc Score,   NEWS,   MEWS Score,   Vital Signs,   Anthropometrics,   Method of Visit,   Data  Encounter Info: Billing Info,   History,   Allergies,   Detailed Report   All Notes   Progress Notes by Levi Aland, NP at 03/11/2024 2:45 PM  Author: Levi Aland, NP Author Type: Nurse Practitioner Filed: 03/11/2024  6:05 PM  Note Status: Signed Cosign: Cosign Not Required Encounter Date: 03/11/2024  Editor: Levi Aland, NP (Nurse Practitioner)             Expand All Collapse All  Cardiology Office Note:  .   Date:  03/11/2024  ID:  Vanita Panda, DOB 1946-05-22, MRN [id number] PCP: Christen Butter, NP  [facility] Providers Cardiologist:  Charlton Haws, MD Electrophysiologist:  Lanier Prude, MD    Patient Profile: .     Narrative History PMH Atrial fibrillation S/p ablation 05/08/2023 Hypertension Hyperlipidemia Tobacco dependence Coronary artery disease CAC Score 806 on CTA 05/04/2023 Aortic atherosclerosis Pulmonary embolus   History of a fib s/p ablation 05/08/2023, still felt poorly in NSR. He did not tolerate amiodarone due to brain fog, fatigue, and dizziness.  These improved once he stopped amiodarone.  MRI of head on 09/03/2023 was negative.  He has been on Coumadin for anticoagulation.  He takes amitriptyline 50 mg at night and clonazepam 3-4 times a week for anxiety and depression.  He continues to smoke one half PPD.  Lung cancer CT - 11/06/2023.  Aortic atherosclerosis and three-vessel coronary calcification noted on CT. Calcium score 806 on pre ablation CTA 05/04/2023.  Carotid duplex 08/31/2023 revealed some plaque, no stenosis.  TEE 02/23/2023 with EF 60 to 65%, mild MR.   Seen in ED 08/26/2023 for chest pain.  He has history of PE.  CT angio negative for PE.  He was on Coumadin with therapeutic INR at the time.   Last cardiology clinic visit was 02/13/2024 with Dr. Eden Emms.  He was maintaining sinus rhythm at 87 bpm.  He was advised to undergo cardiac PET CT for evaluation of ischemia given significantly elevated calcium score.  BP was elevated.  He was advised to continue Toprol and lisinopril. One year follow-up was recommended.    Patient contacted our on-call provider on 03/09/2024 to report HR 130s to 144 over the previous 24 hours.  BP 91/66.  He reported that he was asymptomatic but was concerned about elevated HR and did not want to go to ER.  He had taken Toprol-XL 25 mg the evening prior.  Unfortunately, his most recent INR was 1.4 so he was subtherapeutic and would not be a candidate for DCCV in the ER.  He was advised to hydrate well to support BP and possibly take an additional Toprol after lunch.  He was subsequently scheduled for office visit.      Subjective History of Present Illness: .   Larry Odonnell is a very pleasant 78 y.o. male who is here today for evaluation of tachycardia noted on home Kardia monitor.  He reports he has been feeling well. He decided to check his heart rhythm randomly a few days ago and found HR to be in the 140s.  His wife wanted him to go to the ER but he called our on-call provider as noted above.  His wife wanted him to go to the ER but he called our on-call provider as noted above.  Reports he was not feeling any chest discomfort, palpitations, shortness of breath, fatigue, or edema. He has felt well overall since discontinuing amiodarone which caused severe dizziness and falls. He he took 1 dose of additional metoprolol over the past 2 days.  He denies lightheadedness, presyncope, or syncope.   Discussed the use of AI  scribe software for clinical note transcription with the patient, who gave verbal consent to proceed.    ROS: See HPI     Objective Studies Reviewed: Marland Kitchen   EKG Interpretation Date/Time:                  Monday March 11 2024 14:41:08 EDT Ventricular Rate:         155 PR Interval:                 122 QRS Duration:             82 QT Interval:                 246 QTC Calculation:395 R Axis:                         1   Text Interpretation:      Sinus tachycardia  or atrial flutter Nonspecific T wave abnormality When compared with ECG of 11-Oct-2023 13:57, Vent. rate has increased BY  68 BPM Nonspecific T wave abnormality, worse in Lateral leads Confirmed by Eligha Bridegroom [phone]) on 03/11/2024 5:49:34 PM     Last Labs  No results found for: "LIPOA"      Risk Assessment/Calculations:     CHA2DS2-VASc Score = 4   This indicates a 4.8% annual risk of stroke. The patient's score is based upon: CHF History: 0 HTN History: 1 Diabetes History: 0 Stroke History: 0 Vascular Disease History: 1 Age Score: 2 Gender Score: 0             Physical Exam:   VS:  BP 100/84   Pulse (!) 155   Ht 6\' 1"  (1.854 m)   Wt 207 lb (93.9 kg)   SpO2 95%   BMI 27.31 kg/m       Wt Readings from Last 3 Encounters:  03/11/24 207 lb (93.9 kg)  02/13/24 203 lb 6.4 oz (92.3 kg)  11/02/23 199 lb 1.3 oz (90.3 kg)    GEN: Well nourished, well developed in no acute distress NECK: No JVD; No carotid bruits CARDIAC: RRR, no murmurs, rubs, gallops RESPIRATORY:  Clear to auscultation without rales, wheezing or rhonchi  ABDOMEN: Soft, non-tender, non-distended EXTREMITIES:  No edema; No deformity      ASSESSMENT AND PLAN: .     Tachycardia/Atrial flutter: EKG reveals what appears to be atrial flutter at 155 bpm. Dr. Elease Hashimoto, DOD, attempted carotid massage to see if we could induce changes consistent with SVT but the rhythm did not break.  Patient is asymptomatic.  He has not noted increased chest  discomfort, SOB, fatigue, or edema.  We discussed potential treatment options including DCCV.  INR on 02/23/2024 was subtherapeutic.  He is due for  repeat INR on 03/15/2024.  Continue Coumadin for stroke prevention for CHA2DS2-VASc score of 4. We will increase metoprolol succinate to 50 mg twice daily.  Advised him to reduce lisinopril to give him higher BP on increased dose of BB as BP is soft.  Will route note to Dr. Lalla Brothers, EP cardiologist, for further advice.   Atrial fibrillation on chronic anticoagulation: He underwent A-fib ablation on 05/08/2023 and has thought to have remained in sinus rhythm until 2 days ago when he checked Kardia monitor. He was asymptomatic but randomly wanted to check his rhythm.  HR was in the 140s.  He contacted our on call provider and was advised to take additional beta-blocker.  EKG today reveals what appears to be atrial flutter at 155 bpm. He remains asymptomatic. We are increasing metoprolol succinate to 50 mg twice daily. BP is soft; will have him reduce lisinopril dose accordingly. Will await INR recheck on 3/21 as he was subtherapeutic on 2/28. Will route to Dr. Lalla Brothers for his awareness and additional treatment recommendations.  No missed doses of Coumadin to his awareness.   CAD: No chest pain, shortness of breath, or other symptoms concerning for angina.Marland Kitchen He is awaiting cardiac PET CT as advised by Dr. Eden Emms, primary cardiologist, for ischemia evaluation evaluation based on elevated CAC score.    Hypertension: BP is soft today with similar home readings.  In the setting of increasing beta-blocker for HR control, we will have him reduce lisinopril to 5 mg daily.  Discontinue lisinopril if SBP < 100 mmHg.    Hyperlipidemia LDL goal < 70: Not addressed today. Needs updated lipid panel.        Disposition:TBD    Signed, Eligha Bridegroom, NP-C      For TEE/DCCV; INR 03/20/24 2.3; no changes. [name]

## 2024-03-22 NOTE — Anesthesia Preprocedure Evaluation (Signed)
 Anesthesia Evaluation  Patient identified by MRN, date of birth, ID band Patient awake    Reviewed: Allergy & Precautions, NPO status , Patient's Chart, lab work & pertinent test results, reviewed documented beta blocker date and time   Airway Mallampati: III  TM Distance: >3 FB Neck ROM: Full    Dental  (+) Teeth Intact, Dental Advisory Given   Pulmonary COPD, Current Smoker and Patient abstained from smoking., PE   Pulmonary exam normal breath sounds clear to auscultation       Cardiovascular hypertension, Pt. on home beta blockers and Pt. on medications Normal cardiovascular exam+ dysrhythmias Atrial Fibrillation  Rhythm:Regular Rate:Normal     Neuro/Psych  PSYCHIATRIC DISORDERS Anxiety Depression     Neuromuscular disease    GI/Hepatic negative GI ROS, Neg liver ROS,,,  Endo/Other  negative endocrine ROS    Renal/GU negative Renal ROS     Musculoskeletal  (+) Arthritis , Osteoarthritis,    Abdominal   Peds  Hematology  (+) Blood dyscrasia (Warfarin)   Anesthesia Other Findings   Reproductive/Obstetrics                             Anesthesia Physical Anesthesia Plan  ASA: 3  Anesthesia Plan: MAC   Post-op Pain Management: Minimal or no pain anticipated   Induction: Intravenous  PONV Risk Score and Plan: 1 and Propofol infusion and Treatment may vary due to age or medical condition  Airway Management Planned: Nasal Cannula  Additional Equipment:   Intra-op Plan:   Post-operative Plan:   Informed Consent: I have reviewed the patients History and Physical, chart, labs and discussed the procedure including the risks, benefits and alternatives for the proposed anesthesia with the patient or authorized representative who has indicated his/her understanding and acceptance.     Dental advisory given  Plan Discussed with: CRNA  Anesthesia Plan Comments:        Anesthesia  Quick Evaluation

## 2024-03-22 NOTE — Anesthesia Postprocedure Evaluation (Signed)
 Anesthesia Post Note  Patient: Larry Odonnell [facility]  Procedure(s) Performed: CARDIOVERSION TRANSESOPHAGEAL ECHOCARDIOGRAM     Patient location during evaluation: PACU Anesthesia Type: MAC Level of consciousness: awake and alert Pain management: pain level controlled Vital Signs Assessment: post-procedure vital signs reviewed and stable Respiratory status: spontaneous breathing, nonlabored ventilation and respiratory function stable Cardiovascular status: blood pressure returned to baseline and stable Postop Assessment: no apparent nausea or vomiting Anesthetic complications: no   No notable events documented.  Last Vitals:  Vitals:   03/22/24 1146 03/22/24 1150  BP: 109/73 (!) 111/97  Pulse: 67 69  Resp: 13 18  Temp:    SpO2: 97% 97%    Last Pain:  Vitals:   03/22/24 1135  TempSrc: Temporal  PainSc: 0-No pain                 Lowella Curb

## 2024-03-29 ENCOUNTER — Ambulatory Visit: Attending: Internal Medicine

## 2024-03-29 DIAGNOSIS — I4892 Unspecified atrial flutter: Secondary | ICD-10-CM

## 2024-03-29 LAB — POCT INR: INR: 3 (ref 2.0–3.0)

## 2024-03-29 NOTE — Patient Instructions (Signed)
 Description   Continue taking 5 mg Daily, except 7.5 mg on Mondays, Wednesdays and Fridays.  Recheck INR in 2 weeks.  Anticoagulation Clinic [phone]

## 2024-04-03 NOTE — Progress Notes (Unsigned)
  Electrophysiology Office Follow up Visit Note:    Date:  04/03/2024   ID:  Larry Odonnell, DOB May 30, 1946, MRN [id number]  PCP:  Christen Butter, NP  [facility] Cardiologist:  Charlton Haws, MD  [facility] Electrophysiologist:  Lanier Prude, MD    Interval History:     Larry Odonnell is a 78 y.o. male who presents for a follow up visit.   I last saw the patient October 11, 2023 for atrial fibrillation.  He has a prior catheter ablation May 08, 2023 during which the veins and posterior wall were isolated.  He was previously on amiodarone but this was stopped due to brain fog and gait instability.  This was stopped.  He was on Coumadin for stroke prophylaxis.  He saw Michelle's 1 year in clinic March 11, 2024.  His EKG showed atrial flutter with a ventricular rate of 155 bpm.  According to her note he thought he was maintaining sinus rhythm until 2 days prior to the appointment with Larry Odonnell when his [facility] showed atrial flutter.  He presents today for follow-up.  He is doing well today.  No recurrence of his arrhythmia that he is aware of after the cardioversion.  He feels much better after he stopped amiodarone in the past.      Past medical, surgical, social and family history were reviewed.  ROS:   Please see the history of present illness.    All other systems reviewed and are negative.  EKGs/Labs/Other Studies Reviewed:    The following studies were reviewed today:  March 11, 2024 EKG shows atypical atrial flutter, ventricular rate 155 bpm  October 11, 2023 EKG shows sinus rhythm  August 26, 2023 EKG shows sinus rhythm.  QTc 420 ms         Physical Exam:    VS:  There were no vitals taken for this visit.    Wt Readings from Last 3 Encounters:  03/22/24 205 lb (93 kg)  03/11/24 207 lb (93.9 kg)  02/13/24 203 lb 6.4 oz (92.3 kg)     GEN: no distress CARD: RRR, No MRG RESP: No IWOB. CTAB.      ASSESSMENT:    No diagnosis found. PLAN:     In order of problems listed above:  #Persistent atrial fibrillation #Atrial flutter Prior catheter ablation in May 2024 during which the pulmonary veins and posterior wall were isolated.  He has had symptomatic recurrence with rapid ventricular rates.  I discussed treatment options during today's clinic appointment including continued conservative management, antiarrhythmic drugs and catheter ablation.  He did not tolerate amiodarone.  He currently takes amitriptyline which does interact with Tikosyn to cause increased QT prolongation.  For now, we will continue to watch his heart rhythms.  If he were to have a recurrence of arrhythmia, favor repeat catheter ablation given his intolerance to amiodarone in the past.  He is overall a good candidate for redo catheter ablation.  Continue Coumadin for stroke prophylaxis  #Hypertension At goal today.  Recommend checking blood pressures 1-2 times per week at home and recording the values.  Recommend bringing these recordings to the primary care physician.   Follow-up with EP APP in 6 months.     Signed, Steffanie Dunn, MD, [facility], [facility] 04/03/2024 9:20 AM    Electrophysiology  [facility]

## 2024-04-04 ENCOUNTER — Ambulatory Visit: Attending: Cardiology | Admitting: Cardiology

## 2024-04-04 ENCOUNTER — Encounter: Payer: Self-pay | Admitting: Cardiology

## 2024-04-04 VITALS — BP 122/74 | HR 77 | Ht 73.0 in | Wt 205.0 lb

## 2024-04-04 DIAGNOSIS — I483 Typical atrial flutter: Secondary | ICD-10-CM

## 2024-04-04 DIAGNOSIS — I10 Essential (primary) hypertension: Secondary | ICD-10-CM | POA: Diagnosis not present

## 2024-04-04 DIAGNOSIS — I4819 Other persistent atrial fibrillation: Secondary | ICD-10-CM

## 2024-04-04 NOTE — Patient Instructions (Signed)
 Medication Instructions:  Your physician recommends that you continue on your current medications as directed. Please refer to the Current Medication list given to you today.  *If you need a refill on your cardiac medications before your next appointment, please call your pharmacy*  Follow-Up: At [facility], you and your health needs are our priority.  As part of our continuing mission to provide you with exceptional heart care, our providers are all part of one team.  This team includes your primary Cardiologist (physician) and Advanced Practice Providers or APPs (Physician Assistants and Nurse Practitioners) who all work together to provide you with the care you need, when you need it.  Your next appointment:   6 months  Provider:   You will see one of the following Advanced Practice Providers on your designated Care Team:   Francis Dowse, [place] Casimiro Needle "Mardelle Matte" [place], PA-C Sherie Don, NP Canary Brim, NP        1st Floor: - Lobby - Registration  - Pharmacy  - Lab - Cafe  2nd Floor: - PV Lab - Diagnostic Testing (echo, CT, nuclear med)  3rd Floor: - Vacant  4th Floor: - TCTS (cardiothoracic surgery) - AFib Clinic - Structural Heart Clinic - Vascular Surgery  - Vascular Ultrasound  5th Floor: - HeartCare Cardiology (general and EP) - Clinical Pharmacy for coumadin, hypertension, lipid, weight-loss medications, and med management appointments    Valet parking services will be available as well.

## 2024-04-15 ENCOUNTER — Ambulatory Visit: Attending: Cardiovascular Disease

## 2024-04-15 DIAGNOSIS — I4892 Unspecified atrial flutter: Secondary | ICD-10-CM

## 2024-04-15 LAB — POCT INR: INR: 3.1 — AB (ref 2.0–3.0)

## 2024-04-15 NOTE — Patient Instructions (Signed)
 Description   Eat greens today and Continue taking 5 mg Daily, except 7.5 mg on Mondays, Wednesdays and Fridays.  Recheck INR in 3 weeks.  Anticoagulation Clinic [phone]

## 2024-04-16 ENCOUNTER — Other Ambulatory Visit: Payer: Self-pay | Admitting: Internal Medicine

## 2024-04-16 DIAGNOSIS — I4891 Unspecified atrial fibrillation: Secondary | ICD-10-CM

## 2024-04-17 NOTE — Telephone Encounter (Signed)
 Refused Warfarin 1mg  tablet refill request.  Per last Coumadin  Clinic OV on 04/15/24 pt is currently only taking the 5mg  tablet 1 tablet daily except 1.5 tablets on MWF.  Will remove 1mg  Warfarin tablets from pt's medication list to avoid confusion in dosing in the future.

## 2024-04-26 ENCOUNTER — Other Ambulatory Visit (HOSPITAL_COMMUNITY): Payer: Self-pay | Admitting: *Deleted

## 2024-04-26 ENCOUNTER — Encounter (HOSPITAL_COMMUNITY): Payer: Self-pay

## 2024-04-26 DIAGNOSIS — R079 Chest pain, unspecified: Secondary | ICD-10-CM

## 2024-04-30 ENCOUNTER — Ambulatory Visit (HOSPITAL_COMMUNITY)
Admission: RE | Admit: 2024-04-30 | Discharge: 2024-04-30 | Disposition: A | Discharge status: Home / Self Care | Payer: Medicare Other | Source: Ambulatory Visit | Attending: Cardiovascular Disease | Admitting: Cardiovascular Disease

## 2024-04-30 DIAGNOSIS — R079 Chest pain, unspecified: Secondary | ICD-10-CM | POA: Diagnosis present

## 2024-04-30 DIAGNOSIS — R072 Precordial pain: Secondary | ICD-10-CM | POA: Diagnosis present

## 2024-04-30 LAB — NM PET CT CARDIAC PERFUSION MULTI W/ABSOLUTE BLOODFLOW
LV dias vol: 105 mL (ref 62–150)
LV sys vol: 48 mL
MBFR: 1.55
Nuc Rest EF: 54 %
Nuc Stress EF: 61 %
Peak HR: 80 {beats}/min
Rest HR: 69 {beats}/min
Rest MBF: 1.33 ml/g/min
Rest Nuclear Isotope Dose: 24.1 mCi
SDS: 4
ST Depression (mm): 0 mm
Stress MBF: 2.06 ml/g/min
Stress Nuclear Isotope Dose: 24 mCi

## 2024-04-30 MED ORDER — REGADENOSON 0.4 MG/5ML IV SOLN
0.4000 mg | Freq: Once | INTRAVENOUS | Status: AC
  Administered 2024-04-30: 0.4 mg via INTRAVENOUS

## 2024-04-30 MED ORDER — REGADENOSON 0.4 MG/5ML IV SOLN
INTRAVENOUS | Status: AC
  Filled 2024-04-30: qty 5

## 2024-04-30 MED ORDER — RUBIDIUM RB82 GENERATOR (RUBYFILL)
24.0500 | PACK | Freq: Once | INTRAVENOUS | Status: AC
  Administered 2024-04-30: 24.05 via INTRAVENOUS

## 2024-04-30 MED ORDER — RUBIDIUM RB82 GENERATOR (RUBYFILL)
24.0600 | PACK | Freq: Once | INTRAVENOUS | Status: AC
Start: 2024-04-30 — End: 2024-04-30
  Administered 2024-04-30: 24.06 via INTRAVENOUS

## 2024-05-01 ENCOUNTER — Telehealth: Payer: Self-pay

## 2024-05-01 NOTE — Telephone Encounter (Signed)
-----   Message from Janelle Mediate sent at 05/01/2024  8:21 AM EDT ----- Needs f/u to discuss heart cath Just had [facility] so can't stop blood thinners for 3 weeks would likely cath first week in June

## 2024-05-01 NOTE — Telephone Encounter (Signed)
 Scheduled patient with APP last week of May to discuss heart cath. Sent message to Dr. Stann Earnest to see about having nitroglycerin sent in as needed for chest pain in the future.

## 2024-05-03 MED ORDER — NITROGLYCERIN 0.4 MG SL SUBL: 0.4000 mg | SUBLINGUAL_TABLET | SUBLINGUAL | 3 refills | Status: AC | PRN

## 2024-05-03 NOTE — Telephone Encounter (Signed)
 Sent in nitroglycerin to patient's pharmacy.

## 2024-05-06 ENCOUNTER — Ambulatory Visit: Payer: Medicare Other | Admitting: Medical-Surgical

## 2024-05-06 ENCOUNTER — Ambulatory Visit: Attending: Cardiovascular Disease

## 2024-05-06 ENCOUNTER — Encounter

## 2024-05-06 ENCOUNTER — Encounter: Payer: Self-pay | Admitting: Medical-Surgical

## 2024-05-06 VITALS — BP 133/76 | HR 73 | Resp 20 | Ht 73.0 in | Wt 203.0 lb

## 2024-05-06 DIAGNOSIS — J439 Emphysema, unspecified: Secondary | ICD-10-CM

## 2024-05-06 DIAGNOSIS — I4819 Other persistent atrial fibrillation: Secondary | ICD-10-CM | POA: Diagnosis not present

## 2024-05-06 DIAGNOSIS — F418 Other specified anxiety disorders: Secondary | ICD-10-CM

## 2024-05-06 DIAGNOSIS — I4892 Unspecified atrial flutter: Secondary | ICD-10-CM | POA: Diagnosis not present

## 2024-05-06 DIAGNOSIS — B351 Tinea unguium: Secondary | ICD-10-CM

## 2024-05-06 DIAGNOSIS — L989 Disorder of the skin and subcutaneous tissue, unspecified: Secondary | ICD-10-CM

## 2024-05-06 LAB — POCT INR: INR: 2.6 (ref 2.0–3.0)

## 2024-05-06 MED ORDER — AMITRIPTYLINE HCL 50 MG PO TABS: 50.0000 mg | ORAL_TABLET | Freq: Two times a day (BID) | ORAL | 1 refills | Status: AC | PRN

## 2024-05-06 NOTE — Progress Notes (Signed)
        Established patient visit  History, exam, impression, and plan:  1. Pulmonary emphysema, unspecified emphysema type (HCC) (Primary) Pleasant 78 year old male presenting today with a history of pulmonary emphysema.  He has a long history of smoking and reports that he continues to smoke approximately 1/2 pack of cigarettes per day.  Has lung cancer screening low-dose CT scheduled for later this year.  Has tried to quit multiple times, a couple of times successful however he does tend to go back to smoking.  Has Nicorette gum at home but is not ready to quit at this point.  Advised him of the recommendation for smoking cessation and the risks associated with tobacco use.  Patient verbalized understanding and he will let me know if he decides he would like to quit and needs assistance.  2. Anxiety with depression Currently taking amitriptyline  50 mg in the morning and 50 mg in the evening.  Does not find this to be too sedating and feels that this keeps his mood very stable without excessive highs and lows.  Has Klonopin  that he has used long-term, previously on a scheduled basis.  Now he uses it approximately 1 dose every 3 days when he feels like he is getting too anxious or keyed up to be able to manage on his own.  Feels that the medications are doing well for him and does not desire change in treatment at this time.  Denies SI/HI.  On PHQ-9/GAD-7, symptoms are very well-controlled.  Continue amitriptyline  as prescribed.  Continue very sparing use of clonazepam .  3. Onychomycosis Reports that both of his great toes are affected by fungal disease and have been for the last 2+ years.  Has tried multiple over-the-counter treatments including green tea tree oil without benefit.  Is interested in definitive treatment for this.  Discussed topical Penlac, oral terbinafine, and referral to podiatry.  Feel that his needs would be best suited by a referral to podiatry should he need toenail removal.   Patient is agreeable so sending a referral today. - Ambulatory referral to Podiatry  4. Skin lesion Incidental finding of a large irregular shaped macular lesion on his scalp near the crown of his head on the left side.  Borders are irregular and color is tan to black with mottling.  Has not been to a dermatologist in the last 2 years or so.  Long history of sun exposure.  Given that this is concerning, would recommend he get in with his dermatologist for further evaluation to rule out melanoma.  He plans to go home and look for his dermatologist contact information and schedule an appointment today.  Advised if he is unable to find the contact information for his dermatologist and is open to seeing another 1, I can certainly get a referral placed for him.  Procedures performed this visit: None.  Return in about 6 months (around 11/06/2024) for chronic disease follow up. __________________________________ Maryl Snook, DNP, APRN, FNP-BC Primary Care and Sports Medicine [facility]

## 2024-05-06 NOTE — Patient Instructions (Signed)
 Description   Continue taking 5 mg Daily, except 7.5 mg on Mondays, Wednesdays and Fridays.  Recheck INR in 4 weeks.  Anticoagulation Clinic [phone]

## 2024-05-16 NOTE — H&P (View-Only) (Signed)
 Cardiology Clinic Note   Patient Name: Larry Odonnell Date of Encounter: 05/21/2024  Primary Care Provider:  Cherre Cornish, NP Primary Cardiologist:  Janelle Mediate, MD  Patient Profile    Larry Odonnell 78 year old male presents to the clinic today for follow-up post cardioversion 03/22/2024.  Past Medical History    Past Medical History:  Diagnosis Date   A-fib [facility])    Allergy    Latex   Anxiety 2006   Cancer [facility]) 2012   Prostate   Cataract 2017   Surgery both eyes   Depression 2006   DVT (deep venous thrombosis) (HCC)    Encounter for monitoring warfarin therapy 07/22/2019   History of colon polyps    1 adenoma: 6- 2018   Hypertension    Kidney stones 01/08/2020   Mixed hyperlipidemia 08/09/2021   Peripheral neuropathy 08/02/2012   Primary osteoarthritis of left hip 11/23/2021   Pulmonary embolism (HCC)    Pulmonary emphysema (HCC) 11/07/2022   Pulmonary emphysema (HCC) 11/07/2022   Tobacco dependence 07/02/2019   Past Surgical History:  Procedure Laterality Date   ATRIAL FIBRILLATION ABLATION N/A 05/08/2023   Procedure: ATRIAL FIBRILLATION ABLATION;  Surgeon: Boyce Byes, MD;  Location: [facility] INVASIVE CV LAB;  Service: Cardiovascular;  Laterality: N/A;   CARDIOVERSION N/A 07/21/2022   Procedure: CARDIOVERSION;  Surgeon: Loyde Rule, MD;  Location: [facility] ENDOSCOPY;  Service: Cardiovascular;  Laterality: N/A;   CARDIOVERSION N/A 02/23/2023   Procedure: CARDIOVERSION;  Surgeon: Loyde Rule, MD;  Location: [facility] ENDOSCOPY;  Service: Cardiovascular;  Laterality: N/A;   CARDIOVERSION N/A 03/22/2024   Procedure: CARDIOVERSION;  Surgeon: Lenise Quince, MD;  Location: [facility] INVASIVE CV LAB;  Service: Cardiovascular;  Laterality: N/A;   EYE SURGERY  2017   Cataracts   PROSTATECTOMY     TEE WITHOUT CARDIOVERSION N/A 02/23/2023   Procedure: TRANSESOPHAGEAL ECHOCARDIOGRAM (TEE);  Surgeon: Loyde Rule, MD;  Location: [facility] ENDOSCOPY;  Service: Cardiovascular;   Laterality: N/A;   TONSILLECTOMY     TRANSESOPHAGEAL ECHOCARDIOGRAM (CATH LAB) N/A 03/22/2024   Procedure: TRANSESOPHAGEAL ECHOCARDIOGRAM;  Surgeon: Lenise Quince, MD;  Location: [facility] INVASIVE CV LAB;  Service: Cardiovascular;  Laterality: N/A;    Allergies  Allergies  Allergen Reactions   Amlodipine  Other (See Comments)    Caused the patient to fall   Atorvastatin  Other (See Comments)    Pt reports causes leg pains and leg heaviness   Oxycodone Other (See Comments)    Causes extreme dizziness and falling down   Latex Rash    History of Present Illness    Larry Odonnell has a PMH of atrial fibrillation status post ablation 5/24, HTN, HLD, tobacco dependence, coronary artery disease (coronary calcium  score 806 on 05/04/2023 via CTA), aortic atherosclerosis, and pulmonary embolus.  He underwent ablation 5/24.  He continued to feel not well in sinus rhythm.  He did not tolerate amiodarone  due to brain fog dizziness and fatigue.  His symptoms improved with stopping amiodarone .  MRI of his head 9/24 was negative.  He smokes half pack per day.  On his CT he was noted to have aortic atherosclerosis and three-vessel coronary disease.  Carotid duplex 9/24 showed some plaque and no stenosis.  TEE 02/23/2023 showed an EF of 60-65% and mild MR.  He was seen in the emergency department on 08/26/2019 for.  He reported chest pain.  CT angio chest was negative for PE.  He reported compliance with his Coumadin .  He had therapeutic  INR at that time.  He was seen in follow-up 02/13/2024 by Dr. Stann Earnest.  He was maintaining sinus rhythm in the 80s.  He was advised to undergo cardiac PET/CT for evaluation of ischemia due to his elevated calcium  score.  His blood pressure was elevated.  He was advised to continue metoprolol  and lisinopril .  Follow-up in 1 year was recommended.  He contacted on-call provider 03/09/2024.  He reported that his heart rate was in the 130s-140s and had been over the last 24 hours.  His  blood pressure was 91/66.  He reported that he was asymptomatic.  He was concerned about his elevated heart rate.  He did not want to go to the emergency department.  He had taken metoprolol  as prescribed.  He was noted to have subtherapeutic INR at 1.4.  He was encouraged to increase his p.o. hydration and take additional metoprolol  later that day.  He was scheduled for office visit.  His CHA2DS2-VASc score is 4.  He was seen in my clinic by NP on 03/11/2024.  He was referred to EP for further evaluation of his tachycardia/atrial flutter.  He was advised to reduce his lisinopril  to 5 mg daily.  He denied missed doses of his Coumadin .  He denied chest pain shortness of breath and anginal type symptoms.  His BP was noted to be low in the setting of increasing his beta-blocker for heart rate control  He underwent successful DCCV on 03/22/2024.  He received 1 shock with 200 J and converted to sinus rhythm.  He was continued on Coumadin .  He tolerated the procedure well.  He followed up with Dr. Marven Slimmer for 1025.  He was doing well at that time.  He denied recurrent arrhythmia.  Plan for continue to monitor heart rhythms.  In the occurrence of repeat arrhythmia it was felt that he may need repeat catheter ablation due to his prior intolerance of amiodarone   He presents to the clinic today for follow-up evaluation and states he feels like his strength is not yet returned.  He attributes this to age.  He notes that he has not had any chest pain or sense of fast heart rate.  We reviewed his recent cardioversion.  He denies bleeding issues.  We reviewed his cardiac PET/CT.  He expressed understanding.  We reviewed options for treatment.  He wishes to proceed with cardiac catheterization.  He notes that on his last INR check his INR was 2.6.  We will coordinate his cardiac catheterization with Coumadin  clinic.  I reviewed risks for the procedure.  He expressed understanding.  We will plan follow-up after  catheterization.  I will order CBC and BMP today.  His blood pressure is well-controlled at 128/72.  Today he denies chest pain, shortness of breath, lower extremity edema, palpitations, melena, hematuria, hemoptysis, diaphoresis, weakness, presyncope, syncope, orthopnea, and PND.     Home Medications    Prior to Admission medications   Medication Sig Start Date End Date Taking? Authorizing Provider  amitriptyline  (ELAVIL ) 50 MG tablet Take 1-2 tablets (50-100 mg total) by mouth 2 (two) times daily as needed (Depression). 05/06/24   Cherre Cornish, NP  cholecalciferol (VITAMIN D) 25 MCG (1000 UNIT) tablet Take 1,000 Units by mouth daily.    [provider]  clonazePAM  (KLONOPIN ) 0.5 MG tablet Take 1 tablet (0.5 mg total) by mouth daily as needed for anxiety. 02/27/24   Jessup, Joy, NP  Melatonin 10 MG TABS Take 10 mg by mouth at bedtime as needed (  Sleep).    [provider]  metoprolol  succinate (TOPROL -XL) 50 MG 24 hr tablet Take 1 tablet (50 mg total) by mouth 2 (two) times daily. Take with or immediately following a meal. 03/11/24   Swinyer, Leilani Punter, NP  nitroGLYCERIN  (NITROSTAT ) 0.4 MG SL tablet Place 1 tablet (0.4 mg total) under the tongue every 5 (five) minutes as needed for chest pain. 05/03/24   Nishan, Peter C, MD  rosuvastatin  (CRESTOR ) 10 MG tablet Take 1 tablet (10 mg total) by mouth daily. 02/28/24   Nishan, Peter C, MD  warfarin (COUMADIN ) 5 MG tablet TAKE 1 TABLET BY MOUTH  DAILY AS DIRECTED BY COUMADIN   CLINIC Patient taking differently: Take 5 mg by mouth See admin instructions. Take 5 mg on Tues, Thurs., Sat. and Sun., In the morning DIRECTED BY COUMADIN   CLINIC 01/18/24   Boyce Byes, MD    Family History    Family History  Problem Relation Age of Onset   Anxiety disorder Mother    Cancer Mother    Stroke Mother    Alcohol abuse Father    Early death Father    Anxiety disorder Brother    Depression Brother    Anxiety disorder Daughter     Depression Daughter    Esophageal cancer Neg Hx    He indicated that the status of his mother is unknown. He indicated that the status of his father is unknown. He indicated that the status of his brother is unknown. He indicated that the status of his daughter is unknown. He indicated that the status of his neg hx is unknown.  Social History    Social History   Socioeconomic History   Marital status: Married    Spouse name: Sharl Davies   Number of children: 1   Years of education: 13   Highest education level: Some college, no degree  Occupational History   Occupation: Retired  Tobacco Use   Smoking status: Every Day    Current packs/day: 0.50    Average packs/day: 1.2 packs/day for 52.4 years (61.5 ttl pk-yrs)    Types: Cigarettes    Start date: 2020   Smokeless tobacco: Never   Tobacco comments:    Quit 1990-2015  Vaping Use   Vaping status: Never Used  Substance and Sexual Activity   Alcohol use: Not Currently    Comment: No alcohol since 1995   Drug use: Never   Sexual activity: Not Currently    Birth control/protection: None  Other Topics Concern   Not on file  Social History Narrative   Lives with wife. He enjoys yardwork.   Social Drivers of Corporate investment banker Strain: Low Risk  (12/31/2023)   Overall Financial Resource Strain (CARDIA)    Difficulty of Paying Living Expenses: Not hard at all  Food Insecurity: No Food Insecurity (12/31/2023)   Hunger Vital Sign    Worried About Running Out of Food in the Last Year: Never true    Ran Out of Food in the Last Year: Never true  Transportation Needs: No Transportation Needs (12/31/2023)   PRAPARE - Administrator, Civil Service (Medical): No    Lack of Transportation (Non-Medical): No  Physical Activity: Insufficiently Active (12/31/2023)   Exercise Vital Sign    Days of Exercise per Week: 2 days    Minutes of Exercise per Session: 20 min  Stress: No Stress Concern Present (12/31/2023)    [employer] of Occupational Health - Occupational Stress  Questionnaire    Feeling of Stress : Not at all  Social Connections: Socially Integrated (12/31/2023)   Social Connection and Isolation Panel [NHANES]    Frequency of Communication with Friends and Family: Twice a week    Frequency of Social Gatherings with Friends and Family: Once a week    Attends Religious Services: More than 4 times per year    Active Member of [employer] or Organizations: Yes    Attends Engineer, structural: More than 4 times per year    Marital Status: Married  Catering manager Violence: Not At Risk (08/22/2022)   Humiliation, Afraid, Rape, and Kick questionnaire    Fear of Current or Ex-Partner: No    Emotionally Abused: No    Physically Abused: No    Sexually Abused: No     Review of Systems    General:  No chills, fever, night sweats or weight changes.  Cardiovascular:  No chest pain, dyspnea on exertion, edema, orthopnea, palpitations, paroxysmal nocturnal dyspnea. Dermatological: No rash, lesions/masses Respiratory: No cough, dyspnea Urologic: No hematuria, dysuria Abdominal:   No nausea, vomiting, diarrhea, bright red blood per rectum, melena, or hematemesis Neurologic:  No visual changes, wkns, changes in mental status. All other systems reviewed and are otherwise negative except as noted above.  Physical Exam    VS:  BP 128/72   Pulse 71   Ht 6' 1 (1.854 m)   Wt 202 lb (91.6 kg)   SpO2 99%   BMI 26.65 kg/m  , BMI Body mass index is 26.65 kg/m. GEN: Well nourished, well developed, in no acute distress. HEENT: normal. Neck: Supple, no JVD, carotid bruits, or masses. Cardiac: RRR, no murmurs, rubs, or gallops. No clubbing, cyanosis, edema.  Radials/DP/PT 2+ and equal bilaterally.  Respiratory:  Respirations regular and unlabored, clear to auscultation bilaterally. GI: Soft, nontender, nondistended, BS + x 4. MS: no deformity or atrophy. Skin: warm and dry, no rash. Neuro:   Strength and sensation are intact. Psych: Normal affect.  Accessory Clinical Findings    Recent Labs: 08/22/2023: ALT 27; Magnesium 2.2; TSH 1.520 03/20/2024: BUN 15; Creatinine, Ser 1.39; Hemoglobin 14.4; Platelets 217; Potassium 4.2; Sodium 138   Recent Lipid Panel    Component Value Date/Time   CHOL 166 06/01/2021 0915   TRIG 76 06/01/2021 0915   HDL 46 06/01/2021 0915   CHOLHDL 3.6 06/01/2021 0915   LDLCALC 106 (H) 06/01/2021 0915         ECG personally reviewed by me today-  none today. EKG Interpretation Date/Time:  Tuesday May 21 2024 09:49:42 EDT Ventricular Rate:  68 PR Interval:  156 QRS Duration:  86 QT Interval:  374 QTC Calculation: 397 R Axis:   32  Text Interpretation: Normal sinus rhythm Normal ECG When compared with ECG of 04-Apr-2024 08:04, No significant change was found Confirmed by Lawana Pray [phone]) on 05/21/2024 10:10:29 AM   Echocardiogram 03/22/2024  IMPRESSIONS     1. Left ventricular ejection fraction, by estimation, is 55 to 60%. The  left ventricle has normal function. The left ventricle has no regional  wall motion abnormalities.   2. Right ventricular systolic function is normal. The right ventricular  size is normal.   3. Left atrial size was mildly dilated. No left atrial/left atrial  appendage thrombus was detected.   4. The mitral valve is normal in structure. Mild mitral valve  regurgitation.   5. The aortic valve is tricuspid. Aortic valve regurgitation is not  visualized.  6. There is mild (Grade II) plaque involving the descending aorta.   Conclusion(s)/Recommendation(s): No LA/LAA thrombus identified. Successful  cardioversion performed with restoration of normal sinus rhythm.   FINDINGS   Left Ventricle: Left ventricular ejection fraction, by estimation, is 55  to 60%. The left ventricle has normal function. The left ventricle has no  regional wall motion abnormalities. The left ventricular internal cavity  size was  normal in size.   Right Ventricle: The right ventricular size is normal. Right ventricular  systolic function is normal.   Left Atrium: Left atrial size was mildly dilated. No left atrial/left  atrial appendage thrombus was detected.   Right Atrium: Right atrial size was normal in size.   Pericardium: There is no evidence of pericardial effusion.   Mitral Valve: The mitral valve is normal in structure. Mild mitral valve  regurgitation.   Tricuspid Valve: The tricuspid valve is normal in structure. Tricuspid  valve regurgitation is mild.   Aortic Valve: The aortic valve is tricuspid. Aortic valve regurgitation is  not visualized.   Pulmonic Valve: The pulmonic valve was normal in structure. Pulmonic valve  regurgitation is not visualized.   Aorta: The aortic root is normal in size and structure. There is mild  (Grade II) plaque involving the descending aorta.   IAS/Shunts: No atrial level shunt detected by color flow Doppler.           Assessment & Plan   1.  Atrial fibrillation-Hr today 71 bpm.  Reports compliance with Coumadin .  Denies bleeding issues. Continue metoprolol , Coumadin  Avoid triggers caffeine, chocolate, EtOH, dehydration etc. Following with EP  Coronary artery disease-no chest pain today.  Denies anginal type symptoms.  Cardiac PET/CT showed high risk, small defect with marked reduction in global myocardial flow reserve suggesting multivessel coronary disease EF was noted to be 61% (stress).  Felt to be along RCA territory. Heart healthy low-sodium diet Continue metoprolol , rosuvastatin  Left heart cath CBC, BMP, coordinate with Coumadin  clinic  Informed Consent   Shared Decision Making/Informed Consent The risks [stroke (1 in 1000), death (1 in 1000), kidney failure [usually temporary] (1 in 500), bleeding (1 in 200), allergic reaction [possibly serious] (1 in 200)], benefits (diagnostic support and management of coronary artery disease) and  alternatives of a cardiac catheterization were discussed in detail with Mr. Crear and he is willing to proceed.     Hyperlipidemia-LDL 106 on 06/01/21. High-fiber diet Increase physical activity as tolerated Continue rosuvastatin  Repeat fasting lipids and LFTs  Essential hypertension -BP today 128/72. Maintain blood pressure log Heart healthy low-sodium diet Continue metoprolol   Disposition: Follow-up with Dr. Stann Earnest or APP   Chet Cota. Zorianna Taliaferro NP-C     05/21/2024, 10:10 AM [facility] [address] Office [phone] Fax [phone]    I spent 15 minutes examining this patient, reviewing medications, and using patient centered shared decision making involving their cardiac care.   I spent  20 minutes reviewing past medical history,  medications, and prior cardiac tests.

## 2024-05-16 NOTE — Progress Notes (Unsigned)
 Cardiology Clinic Note   Patient Name: Larry Odonnell Date of Encounter: 05/21/2024  Primary Care Provider:  Cherre Cornish, NP Primary Cardiologist:  Janelle Mediate, MD  Patient Profile    Larry Odonnell 78 year old male presents to the clinic today for follow-up post cardioversion 03/22/2024.  Past Medical History    Past Medical History:  Diagnosis Date   A-fib [facility])    Allergy    Latex   Anxiety 2006   Cancer [facility]) 2012   Prostate   Cataract 2017   Surgery both eyes   Depression 2006   DVT (deep venous thrombosis) (HCC)    Encounter for monitoring warfarin therapy 07/22/2019   History of colon polyps    1 adenoma: 6- 2018   Hypertension    Kidney stones 01/08/2020   Mixed hyperlipidemia 08/09/2021   Peripheral neuropathy 08/02/2012   Primary osteoarthritis of left hip 11/23/2021   Pulmonary embolism (HCC)    Pulmonary emphysema (HCC) 11/07/2022   Pulmonary emphysema (HCC) 11/07/2022   Tobacco dependence 07/02/2019   Past Surgical History:  Procedure Laterality Date   ATRIAL FIBRILLATION ABLATION N/A 05/08/2023   Procedure: ATRIAL FIBRILLATION ABLATION;  Surgeon: Boyce Byes, MD;  Location: [facility] INVASIVE CV LAB;  Service: Cardiovascular;  Laterality: N/A;   CARDIOVERSION N/A 07/21/2022   Procedure: CARDIOVERSION;  Surgeon: Loyde Rule, MD;  Location: [facility] ENDOSCOPY;  Service: Cardiovascular;  Laterality: N/A;   CARDIOVERSION N/A 02/23/2023   Procedure: CARDIOVERSION;  Surgeon: Loyde Rule, MD;  Location: [facility] ENDOSCOPY;  Service: Cardiovascular;  Laterality: N/A;   CARDIOVERSION N/A 03/22/2024   Procedure: CARDIOVERSION;  Surgeon: Lenise Quince, MD;  Location: [facility] INVASIVE CV LAB;  Service: Cardiovascular;  Laterality: N/A;   EYE SURGERY  2017   Cataracts   PROSTATECTOMY     TEE WITHOUT CARDIOVERSION N/A 02/23/2023   Procedure: TRANSESOPHAGEAL ECHOCARDIOGRAM (TEE);  Surgeon: Loyde Rule, MD;  Location: [facility] ENDOSCOPY;  Service: Cardiovascular;   Laterality: N/A;   TONSILLECTOMY     TRANSESOPHAGEAL ECHOCARDIOGRAM (CATH LAB) N/A 03/22/2024   Procedure: TRANSESOPHAGEAL ECHOCARDIOGRAM;  Surgeon: Lenise Quince, MD;  Location: [facility] INVASIVE CV LAB;  Service: Cardiovascular;  Laterality: N/A;    Allergies  Allergies  Allergen Reactions   Amlodipine  Other (See Comments)    Caused the patient to fall   Atorvastatin  Other (See Comments)    Pt reports causes leg pains and leg heaviness   Oxycodone Other (See Comments)    Causes extreme dizziness and falling down   Latex Rash    History of Present Illness    Larry Odonnell has a PMH of atrial fibrillation status post ablation 5/24, HTN, HLD, tobacco dependence, coronary artery disease (coronary calcium  score 806 on 05/04/2023 via CTA), aortic atherosclerosis, and pulmonary embolus.  He underwent ablation 5/24.  He continued to feel not well in sinus rhythm.  He did not tolerate amiodarone  due to brain fog dizziness and fatigue.  His symptoms improved with stopping amiodarone .  MRI of his head 9/24 was negative.  He smokes half pack per day.  On his CT he was noted to have aortic atherosclerosis and three-vessel coronary disease.  Carotid duplex 9/24 showed some plaque and no stenosis.  TEE 02/23/2023 showed an EF of 60-65% and mild MR.  He was seen in the emergency department on 08/26/2019 for.  He reported chest pain.  CT angio chest was negative for PE.  He reported compliance with his Coumadin .  He had therapeutic  INR at that time.  He was seen in follow-up 02/13/2024 by Dr. Stann Earnest.  He was maintaining sinus rhythm in the 80s.  He was advised to undergo cardiac PET/CT for evaluation of ischemia due to his elevated calcium  score.  His blood pressure was elevated.  He was advised to continue metoprolol  and lisinopril .  Follow-up in 1 year was recommended.  He contacted on-call provider 03/09/2024.  He reported that his heart rate was in the 130s-140s and had been over the last 24 hours.  His  blood pressure was 91/66.  He reported that he was asymptomatic.  He was concerned about his elevated heart rate.  He did not want to go to the emergency department.  He had taken metoprolol  as prescribed.  He was noted to have subtherapeutic INR at 1.4.  He was encouraged to increase his p.o. hydration and take additional metoprolol  later that day.  He was scheduled for office visit.  His CHA2DS2-VASc score is 4.  He was seen in my clinic by NP on 03/11/2024.  He was referred to EP for further evaluation of his tachycardia/atrial flutter.  He was advised to reduce his lisinopril  to 5 mg daily.  He denied missed doses of his Coumadin .  He denied chest pain shortness of breath and anginal type symptoms.  His BP was noted to be low in the setting of increasing his beta-blocker for heart rate control  He underwent successful DCCV on 03/22/2024.  He received 1 shock with 200 J and converted to sinus rhythm.  He was continued on Coumadin .  He tolerated the procedure well.  He followed up with Dr. Marven Slimmer for 1025.  He was doing well at that time.  He denied recurrent arrhythmia.  Plan for continue to monitor heart rhythms.  In the occurrence of repeat arrhythmia it was felt that he may need repeat catheter ablation due to his prior intolerance of amiodarone   He presents to the clinic today for follow-up evaluation and states he feels like his strength is not yet returned.  He attributes this to age.  He notes that he has not had any chest pain or sense of fast heart rate.  We reviewed his recent cardioversion.  He denies bleeding issues.  We reviewed his cardiac PET/CT.  He expressed understanding.  We reviewed options for treatment.  He wishes to proceed with cardiac catheterization.  He notes that on his last INR check his INR was 2.6.  We will coordinate his cardiac catheterization with Coumadin  clinic.  I reviewed risks for the procedure.  He expressed understanding.  We will plan follow-up after  catheterization.  I will order CBC and BMP today.  His blood pressure is well-controlled at 128/72.  Today he denies chest pain, shortness of breath, lower extremity edema, palpitations, melena, hematuria, hemoptysis, diaphoresis, weakness, presyncope, syncope, orthopnea, and PND.     Home Medications    Prior to Admission medications   Medication Sig Start Date End Date Taking? Authorizing Provider  amitriptyline  (ELAVIL ) 50 MG tablet Take 1-2 tablets (50-100 mg total) by mouth 2 (two) times daily as needed (Depression). 05/06/24   Cherre Cornish, NP  cholecalciferol (VITAMIN D) 25 MCG (1000 UNIT) tablet Take 1,000 Units by mouth daily.    [provider]  clonazePAM  (KLONOPIN ) 0.5 MG tablet Take 1 tablet (0.5 mg total) by mouth daily as needed for anxiety. 02/27/24   Jessup, Joy, NP  Melatonin 10 MG TABS Take 10 mg by mouth at bedtime as needed (  Sleep).    [provider]  metoprolol  succinate (TOPROL -XL) 50 MG 24 hr tablet Take 1 tablet (50 mg total) by mouth 2 (two) times daily. Take with or immediately following a meal. 03/11/24   Swinyer, Leilani Punter, NP  nitroGLYCERIN  (NITROSTAT ) 0.4 MG SL tablet Place 1 tablet (0.4 mg total) under the tongue every 5 (five) minutes as needed for chest pain. 05/03/24   Nishan, Peter C, MD  rosuvastatin  (CRESTOR ) 10 MG tablet Take 1 tablet (10 mg total) by mouth daily. 02/28/24   Nishan, Peter C, MD  warfarin (COUMADIN ) 5 MG tablet TAKE 1 TABLET BY MOUTH  DAILY AS DIRECTED BY COUMADIN   CLINIC Patient taking differently: Take 5 mg by mouth See admin instructions. Take 5 mg on Tues, Thurs., Sat. and Sun., In the morning DIRECTED BY COUMADIN   CLINIC 01/18/24   Boyce Byes, MD    Family History    Family History  Problem Relation Age of Onset   Anxiety disorder Mother    Cancer Mother    Stroke Mother    Alcohol abuse Father    Early death Father    Anxiety disorder Brother    Depression Brother    Anxiety disorder Daughter     Depression Daughter    Esophageal cancer Neg Hx    He indicated that the status of his mother is unknown. He indicated that the status of his father is unknown. He indicated that the status of his brother is unknown. He indicated that the status of his daughter is unknown. He indicated that the status of his neg hx is unknown.  Social History    Social History   Socioeconomic History   Marital status: Married    Spouse name: Sharl Davies   Number of children: 1   Years of education: 13   Highest education level: Some college, no degree  Occupational History   Occupation: Retired  Tobacco Use   Smoking status: Every Day    Current packs/day: 0.50    Average packs/day: 1.2 packs/day for 52.4 years (61.5 ttl pk-yrs)    Types: Cigarettes    Start date: 2020   Smokeless tobacco: Never   Tobacco comments:    Quit 1990-2015  Vaping Use   Vaping status: Never Used  Substance and Sexual Activity   Alcohol use: Not Currently    Comment: No alcohol since 1995   Drug use: Never   Sexual activity: Not Currently    Birth control/protection: None  Other Topics Concern   Not on file  Social History Narrative   Lives with wife. He enjoys yardwork.   Social Drivers of Corporate investment banker Strain: Low Risk  (12/31/2023)   Overall Financial Resource Strain (CARDIA)    Difficulty of Paying Living Expenses: Not hard at all  Food Insecurity: No Food Insecurity (12/31/2023)   Hunger Vital Sign    Worried About Running Out of Food in the Last Year: Never true    Ran Out of Food in the Last Year: Never true  Transportation Needs: No Transportation Needs (12/31/2023)   PRAPARE - Administrator, Civil Service (Medical): No    Lack of Transportation (Non-Medical): No  Physical Activity: Insufficiently Active (12/31/2023)   Exercise Vital Sign    Days of Exercise per Week: 2 days    Minutes of Exercise per Session: 20 min  Stress: No Stress Concern Present (12/31/2023)    [employer] of Occupational Health - Occupational Stress  Questionnaire    Feeling of Stress : Not at all  Social Connections: Socially Integrated (12/31/2023)   Social Connection and Isolation Panel [NHANES]    Frequency of Communication with Friends and Family: Twice a week    Frequency of Social Gatherings with Friends and Family: Once a week    Attends Religious Services: More than 4 times per year    Active Member of [employer] or Organizations: Yes    Attends Engineer, structural: More than 4 times per year    Marital Status: Married  Catering manager Violence: Not At Risk (08/22/2022)   Humiliation, Afraid, Rape, and Kick questionnaire    Fear of Current or Ex-Partner: No    Emotionally Abused: No    Physically Abused: No    Sexually Abused: No     Review of Systems    General:  No chills, fever, night sweats or weight changes.  Cardiovascular:  No chest pain, dyspnea on exertion, edema, orthopnea, palpitations, paroxysmal nocturnal dyspnea. Dermatological: No rash, lesions/masses Respiratory: No cough, dyspnea Urologic: No hematuria, dysuria Abdominal:   No nausea, vomiting, diarrhea, bright red blood per rectum, melena, or hematemesis Neurologic:  No visual changes, wkns, changes in mental status. All other systems reviewed and are otherwise negative except as noted above.  Physical Exam    VS:  BP 128/72   Pulse 71   Ht 6\' 1"  (1.854 m)   Wt 202 lb (91.6 kg)   SpO2 99%   BMI 26.65 kg/m  , BMI Body mass index is 26.65 kg/m. GEN: Well nourished, well developed, in no acute distress. HEENT: normal. Neck: Supple, no JVD, carotid bruits, or masses. Cardiac: RRR, no murmurs, rubs, or gallops. No clubbing, cyanosis, edema.  Radials/DP/PT 2+ and equal bilaterally.  Respiratory:  Respirations regular and unlabored, clear to auscultation bilaterally. GI: Soft, nontender, nondistended, BS + x 4. MS: no deformity or atrophy. Skin: warm and dry, no rash. Neuro:   Strength and sensation are intact. Psych: Normal affect.  Accessory Clinical Findings    Recent Labs: 08/22/2023: ALT 27; Magnesium 2.2; TSH 1.520 03/20/2024: BUN 15; Creatinine, Ser 1.39; Hemoglobin 14.4; Platelets 217; Potassium 4.2; Sodium 138   Recent Lipid Panel    Component Value Date/Time   CHOL 166 06/01/2021 0915   TRIG 76 06/01/2021 0915   HDL 46 06/01/2021 0915   CHOLHDL 3.6 06/01/2021 0915   LDLCALC 106 (H) 06/01/2021 0915         ECG personally reviewed by me today-  none today. EKG Interpretation Date/Time:  Tuesday May 21 2024 09:49:42 EDT Ventricular Rate:  68 PR Interval:  156 QRS Duration:  86 QT Interval:  374 QTC Calculation: 397 R Axis:   32  Text Interpretation: Normal sinus rhythm Normal ECG When compared with ECG of 04-Apr-2024 08:04, No significant change was found Confirmed by Lawana Pray [phone]) on 05/21/2024 10:10:29 AM   Echocardiogram 03/22/2024  IMPRESSIONS     1. Left ventricular ejection fraction, by estimation, is 55 to 60%. The  left ventricle has normal function. The left ventricle has no regional  wall motion abnormalities.   2. Right ventricular systolic function is normal. The right ventricular  size is normal.   3. Left atrial size was mildly dilated. No left atrial/left atrial  appendage thrombus was detected.   4. The mitral valve is normal in structure. Mild mitral valve  regurgitation.   5. The aortic valve is tricuspid. Aortic valve regurgitation is not  visualized.  6. There is mild (Grade II) plaque involving the descending aorta.   Conclusion(s)/Recommendation(s): No LA/LAA thrombus identified. Successful  cardioversion performed with restoration of normal sinus rhythm.   FINDINGS   Left Ventricle: Left ventricular ejection fraction, by estimation, is 55  to 60%. The left ventricle has normal function. The left ventricle has no  regional wall motion abnormalities. The left ventricular internal cavity  size was  normal in size.   Right Ventricle: The right ventricular size is normal. Right ventricular  systolic function is normal.   Left Atrium: Left atrial size was mildly dilated. No left atrial/left  atrial appendage thrombus was detected.   Right Atrium: Right atrial size was normal in size.   Pericardium: There is no evidence of pericardial effusion.   Mitral Valve: The mitral valve is normal in structure. Mild mitral valve  regurgitation.   Tricuspid Valve: The tricuspid valve is normal in structure. Tricuspid  valve regurgitation is mild.   Aortic Valve: The aortic valve is tricuspid. Aortic valve regurgitation is  not visualized.   Pulmonic Valve: The pulmonic valve was normal in structure. Pulmonic valve  regurgitation is not visualized.   Aorta: The aortic root is normal in size and structure. There is mild  (Grade II) plaque involving the descending aorta.   IAS/Shunts: No atrial level shunt detected by color flow Doppler.           Assessment & Plan   1.  Atrial fibrillation-Hr today 71 bpm.  Reports compliance with Coumadin .  Denies bleeding issues. Continue metoprolol , Coumadin  Avoid triggers caffeine, chocolate, EtOH, dehydration etc. Following with EP  Coronary artery disease-no chest pain today.  Denies anginal type symptoms.  Cardiac PET/CT showed high risk, small defect with marked reduction in global myocardial flow reserve suggesting multivessel coronary disease EF was noted to be 61% (stress).  Felt to be along RCA territory. Heart healthy low-sodium diet Continue metoprolol , rosuvastatin  Left heart cath CBC, BMP, coordinate with Coumadin  clinic  Informed Consent   Shared Decision Making/Informed Consent The risks [stroke (1 in 1000), death (1 in 1000), kidney failure [usually temporary] (1 in 500), bleeding (1 in 200), allergic reaction [possibly serious] (1 in 200)], benefits (diagnostic support and management of coronary artery disease) and  alternatives of a cardiac catheterization were discussed in detail with Larry Odonnell and he is willing to proceed.     Hyperlipidemia-LDL 106 on 06/01/21. High-fiber diet Increase physical activity as tolerated Continue rosuvastatin  Repeat fasting lipids and LFTs  Essential hypertension -BP today 128/72. Maintain blood pressure log Heart healthy low-sodium diet Continue metoprolol   Disposition: Follow-up with Dr. Nishan or APP   Larry Cota. Masai Kidd NP-C     05/21/2024, 10:10 AM [facility] [address] Office [phone] Fax [phone]    I spent 15 minutes examining this patient, reviewing medications, and using patient centered shared decision making involving their cardiac care.   I spent  20 minutes reviewing past medical history,  medications, and prior cardiac tests.

## 2024-05-21 ENCOUNTER — Ambulatory Visit: Attending: General Practice | Admitting: General Practice

## 2024-05-21 ENCOUNTER — Encounter: Payer: Self-pay | Admitting: General Practice

## 2024-05-21 VITALS — BP 128/72 | HR 71 | Ht 73.0 in | Wt 202.0 lb

## 2024-05-21 DIAGNOSIS — I10 Essential (primary) hypertension: Secondary | ICD-10-CM

## 2024-05-21 DIAGNOSIS — I2510 Atherosclerotic heart disease of native coronary artery without angina pectoris: Secondary | ICD-10-CM

## 2024-05-21 DIAGNOSIS — I4892 Unspecified atrial flutter: Secondary | ICD-10-CM | POA: Diagnosis not present

## 2024-05-21 DIAGNOSIS — E785 Hyperlipidemia, unspecified: Secondary | ICD-10-CM

## 2024-05-21 NOTE — Patient Instructions (Signed)
 Medication Instructions:  The current medical regimen is effective;  continue present plan and medications as directed. Please refer to the Current Medication list given to you today.  *If you need a refill on your cardiac medications before your next appointment, please call your pharmacy*  Lab Work: CBC AND BMET If you have labs (blood work) drawn today and your tests are completely normal, you will receive your results only by: MyChart Message (if you have MyChart) OR A paper copy in the mail If you have any lab test that is abnormal or we need to change your treatment, we will call you to review the results.  Testing/Procedures: NONE  Follow-Up: At [facility], you and your health needs are our priority.  As part of our continuing mission to provide you with exceptional heart care, our providers are all part of one team.  This team includes your primary Cardiologist (physician) and Advanced Practice Providers or APPs (Physician Assistants and Nurse Practitioners) who all work together to provide you with the care you need, when you need it.  Your next appointment:   AFTER LEFT CATH    [place] A DEPT OF [place]. [facility] AT [address] A DEPT OF THE [place]. [facility] [address] [place] [address] Dept: [phone] Loc: [phone]  Liam Cammarata [facility]  05/21/2024  You are scheduled for a Cardiac Catheterization on Friday, June 13 with Dr. Arnoldo Lapping.  1. Please arrive at the [facility] (Main Entrance A) at [facility]: [address] [place], [place] [address] at 5:30 AM (This time is 2 hour(s) before your procedure to ensure your preparation).   Free valet parking service is available. You will check in at ADMITTING. The support person will be asked to wait in the waiting room.  It is OK to have someone drop you off and come back when you are ready to be discharged.    Special note: Every effort is made to  have your procedure done on time. Please understand that emergencies sometimes delay scheduled procedures.  2. Diet: Do not eat solid foods after midnight.  The patient may have clear liquids until 5am upon the day of the procedure.  3. Labs: Today. Appointment with Coumadin  clinic on June 9th.  4. Medication instructions in preparation for your procedure:   Contrast Allergy: No   Hold medications morning of procedure. You will be instructed on how/if to hold your coumadin  after your appointment with them.  5. Plan to go home the same day, you will only stay overnight if medically necessary. 6. Bring a current list of your medications and current insurance cards. 7. You MUST have a responsible person to drive you home. 8. Someone MUST be with you the first 24 hours after you arrive home or your discharge will be delayed. 9. Please wear clothes that are easy to get on and off and wear slip-on shoes.  Thank you for allowing us  to care for you!   -- [place] [facility]   Provider:   Janelle Mediate, MD or Lawana Pray, NP          We recommend signing up for the patient portal called "MyChart".  Sign up information is provided on this After Visit Summary.  MyChart is used to connect with patients for Virtual Visits (Telemedicine).  Patients are able to view lab/test results, encounter notes, upcoming appointments, etc.  Non-urgent messages can be sent  to your provider as well.   To learn more about what you can do with MyChart, go to [email or web address].   Other Instructions

## 2024-05-22 ENCOUNTER — Ambulatory Visit: Payer: Self-pay | Admitting: General Practice

## 2024-05-22 ENCOUNTER — Telehealth: Payer: Self-pay | Admitting: General Practice

## 2024-05-22 LAB — BASIC METABOLIC PANEL WITH GFR
BUN/Creatinine Ratio: 15 (ref 10–24)
BUN: 17 mg/dL (ref 8–27)
CO2: 21 mmol/L (ref 20–29)
Calcium: 9.5 mg/dL (ref 8.6–10.2)
Chloride: 104 mmol/L (ref 96–106)
Creatinine, Ser: 1.14 mg/dL (ref 0.76–1.27)
Glucose: 95 mg/dL (ref 70–99)
Potassium: 5.2 mmol/L (ref 3.5–5.2)
Sodium: 140 mmol/L (ref 134–144)
eGFR: 66 mL/min/{1.73_m2} (ref >59)

## 2024-05-22 LAB — CBC
Hematocrit: 44.4 % (ref 37.5–51.0)
Hemoglobin: 14.6 g/dL (ref 13.0–17.7)
MCH: 31.1 pg (ref 26.6–33.0)
MCHC: 32.9 g/dL (ref 31.5–35.7)
MCV: 95 fL (ref 79–97)
Platelets: 245 10*3/uL (ref 150–450)
RBC: 4.7 x10E6/uL (ref 4.14–5.80)
RDW: 12.9 % (ref 11.6–15.4)
WBC: 8.5 10*3/uL (ref 3.4–10.8)

## 2024-05-22 NOTE — Telephone Encounter (Signed)
 Spoke to patient he already received lab results.

## 2024-05-22 NOTE — Telephone Encounter (Signed)
 Pt returning call for lab results

## 2024-05-29 ENCOUNTER — Other Ambulatory Visit: Payer: Self-pay | Admitting: Medical-Surgical

## 2024-05-30 ENCOUNTER — Encounter: Payer: Self-pay | Admitting: Podiatry

## 2024-05-30 ENCOUNTER — Encounter: Payer: Self-pay | Admitting: Medical-Surgical

## 2024-05-30 ENCOUNTER — Ambulatory Visit: Admitting: Podiatry

## 2024-05-30 DIAGNOSIS — Z7901 Long term (current) use of anticoagulants: Secondary | ICD-10-CM | POA: Diagnosis not present

## 2024-05-30 DIAGNOSIS — B351 Tinea unguium: Secondary | ICD-10-CM

## 2024-05-30 DIAGNOSIS — M79674 Pain in right toe(s): Secondary | ICD-10-CM

## 2024-05-30 DIAGNOSIS — M79675 Pain in left toe(s): Secondary | ICD-10-CM

## 2024-05-30 NOTE — Progress Notes (Signed)
  Subjective:  Patient ID: Larry Odonnell, male    DOB: [birth year],   MRN: [id number]  Chief Complaint  Patient presents with   Nail Problem    "I have toenail fungus." N - toenail fungus L - 1-5 bilateral D - 5 yrs O - gradually worse C - thick, discolored A - none T - Green Tea Oil, Ointment     78 y.o. male presents for concern as above. He is requesting to have the great toenails and right second toenail removed. He is on blood thinners and at risk for rfc.  Denies any other pedal complaints. Denies n/v/f/c.   Past Medical History:  Diagnosis Date   A-fib [facility])    Allergy    Latex   Anxiety 2006   Cancer [facility]) 2012   Prostate   Cataract 2017   Surgery both eyes   Depression 2006   DVT (deep venous thrombosis) (HCC)    Encounter for monitoring warfarin therapy 07/22/2019   History of colon polyps    1 adenoma: 6- 2018   Hypertension    Kidney stones 01/08/2020   Mixed hyperlipidemia 08/09/2021   Peripheral neuropathy 08/02/2012   Primary osteoarthritis of left hip 11/23/2021   Pulmonary embolism (HCC)    Pulmonary emphysema (HCC) 11/07/2022   Pulmonary emphysema (HCC) 11/07/2022   Tobacco dependence 07/02/2019    Objective:  Physical Exam: Vascular: DP/PT pulses 2/4 bilateral. CFT <3 seconds. Normal hair growth on digits. No edema.  Skin. No lacerations or abrasions bilateral feet. Nails 1-5 bilateral are thickened and elongated. Bilateral hallux nails and right second with subungual debris and incuvation noted to medial left hallux nail.  Musculoskeletal: MMT 5/5 bilateral lower extremities in DF, PF, Inversion and Eversion. Deceased ROM in DF of ankle joint.  Neurological: Sensation intact to light touch.   Assessment:   1. Pain due to onychomycosis of toenails of both feet   2. Chronic anticoagulation      Plan:  Patient was evaluated and treated and all questions answered. -Examined patient -Discussed and educated patient on  foot care, especially  with  regards to the vascular, neurological and musculoskeletal systems.  -Discussed supportive shoes at all times and checking feet regularly.  -Mechanically debrided all nails 1-5 bilateral using sterile nail nipper and filed with dremel without incident  -Discussed removal at some point of the nails in the future after some other things he needs to get done.  -Answered all patient questions -Patient to return  in 3 months for at risk foot care -Patient advised to call the office if any problems or questions arise in the meantime.    Jennefer Moats, DPM

## 2024-06-03 ENCOUNTER — Ambulatory Visit: Attending: Cardiovascular Disease

## 2024-06-03 DIAGNOSIS — Z5181 Encounter for therapeutic drug level monitoring: Secondary | ICD-10-CM

## 2024-06-03 DIAGNOSIS — I4892 Unspecified atrial flutter: Secondary | ICD-10-CM | POA: Diagnosis not present

## 2024-06-03 LAB — POCT INR: INR: 3.6 — AB (ref 2.0–3.0)

## 2024-06-03 NOTE — Patient Instructions (Signed)
 Hold Tuesday and Wednesday and take 0.5 tablet Thursday then Continue taking 5 mg Daily, except 7.5 mg on Mondays, Wednesdays and Fridays.  Recheck INR in 1 week post cath 6/13.  Anticoagulation Clinic [phone]

## 2024-06-05 ENCOUNTER — Telehealth: Payer: Self-pay | Admitting: *Deleted

## 2024-06-05 ENCOUNTER — Telehealth: Payer: Self-pay | Admitting: Pharmacist

## 2024-06-05 NOTE — Telephone Encounter (Signed)
 Cardiac Catheterization scheduled at [facility] for: Friday June 07, 2024 7:30 AM Arrival time [facility] Main Entrance A at: 5:30 AM  Nothing to eat after midnight prior to procedure, clear liquids until 5 AM day of procedure.  Medication instructions: -Hold:  Warfarin- see 06/03/24 Anti-coag visit for details. -Other usual morning medications can be taken with sips of water including aspirin 81 mg.  Plan to go home the same day, you will only stay overnight if medically necessary.  You must have responsible adult to drive you home.  Someone must be with you the first 24 hours after you arrive home.  Reviewed procedure instructions with patient.

## 2024-06-05 NOTE — Telephone Encounter (Signed)
 Contacted patient and let him know to take warfarin 2.5mg  on Thursday 6/12, 7.5mg  on 6/13 after procedure, and 7.5mg  on 6/14. Pt voiced understanding

## 2024-06-07 ENCOUNTER — Ambulatory Visit (HOSPITAL_COMMUNITY)
Admission: RE | Admit: 2024-06-07 | Discharge: 2024-06-07 | Disposition: A | Discharge status: Home / Self Care | Attending: Cardiovascular Disease | Admitting: Cardiovascular Disease

## 2024-06-07 ENCOUNTER — Encounter (HOSPITAL_COMMUNITY)
Admission: RE | Disposition: A | Discharge status: Home / Self Care | Payer: Self-pay | Source: Home / Self Care | Attending: Cardiovascular Disease

## 2024-06-07 ENCOUNTER — Other Ambulatory Visit: Payer: Self-pay

## 2024-06-07 DIAGNOSIS — F17210 Nicotine dependence, cigarettes, uncomplicated: Secondary | ICD-10-CM | POA: Insufficient documentation

## 2024-06-07 DIAGNOSIS — I10 Essential (primary) hypertension: Secondary | ICD-10-CM | POA: Insufficient documentation

## 2024-06-07 DIAGNOSIS — I2510 Atherosclerotic heart disease of native coronary artery without angina pectoris: Secondary | ICD-10-CM

## 2024-06-07 DIAGNOSIS — I700 Atherosclerosis of aorta: Secondary | ICD-10-CM | POA: Insufficient documentation

## 2024-06-07 DIAGNOSIS — E785 Hyperlipidemia, unspecified: Secondary | ICD-10-CM | POA: Insufficient documentation

## 2024-06-07 DIAGNOSIS — Z7901 Long term (current) use of anticoagulants: Secondary | ICD-10-CM | POA: Diagnosis not present

## 2024-06-07 DIAGNOSIS — I480 Paroxysmal atrial fibrillation: Secondary | ICD-10-CM | POA: Insufficient documentation

## 2024-06-07 DIAGNOSIS — Z86711 Personal history of pulmonary embolism: Secondary | ICD-10-CM | POA: Insufficient documentation

## 2024-06-07 DIAGNOSIS — Z79899 Other long term (current) drug therapy: Secondary | ICD-10-CM | POA: Diagnosis not present

## 2024-06-07 DIAGNOSIS — I2582 Chronic total occlusion of coronary artery: Secondary | ICD-10-CM | POA: Insufficient documentation

## 2024-06-07 DIAGNOSIS — D6869 Other thrombophilia: Secondary | ICD-10-CM

## 2024-06-07 DIAGNOSIS — R9439 Abnormal result of other cardiovascular function study: Secondary | ICD-10-CM | POA: Diagnosis present

## 2024-06-07 HISTORY — PX: CORONARY ANGIOGRAPHY: CATH118303

## 2024-06-07 LAB — PROTIME-INR
INR: 1.2 (ref 0.8–1.2)
Prothrombin Time: 15.3 s — ABNORMAL HIGH (ref 11.4–15.2)

## 2024-06-07 SURGERY — CORONARY ANGIOGRAPHY (CATH LAB)
Anesthesia: LOCAL

## 2024-06-07 MED ORDER — SODIUM CHLORIDE 0.9% FLUSH: 3.0000 mL | INTRAVENOUS | Status: DC | PRN

## 2024-06-07 MED ORDER — LIDOCAINE HCL (PF) 1 % IJ SOLN
INTRAMUSCULAR | Status: DC | PRN
  Administered 2024-06-07: 2 mL

## 2024-06-07 MED ORDER — VERAPAMIL HCL 2.5 MG/ML IV SOLN
INTRAVENOUS | Status: DC | PRN
  Administered 2024-06-07: 10 mL via INTRA_ARTERIAL

## 2024-06-07 MED ORDER — FENTANYL CITRATE (PF) 100 MCG/2ML IJ SOLN
INTRAMUSCULAR | Status: DC | PRN
  Administered 2024-06-07: 25 ug via INTRAVENOUS

## 2024-06-07 MED ORDER — MIDAZOLAM HCL 2 MG/2ML IJ SOLN
INTRAMUSCULAR | Status: AC
  Filled 2024-06-07: qty 2

## 2024-06-07 MED ORDER — SODIUM CHLORIDE 0.9 % WEIGHT BASED INFUSION: 3.0000 mL/kg/h | INTRAVENOUS | Status: AC

## 2024-06-07 MED ORDER — ACETAMINOPHEN 325 MG PO TABS: 650.0000 mg | ORAL_TABLET | ORAL | Status: DC | PRN

## 2024-06-07 MED ORDER — SODIUM CHLORIDE 0.9 % WEIGHT BASED INFUSION: 1.0000 mL/kg/h | INTRAVENOUS | Status: DC

## 2024-06-07 MED ORDER — MIDAZOLAM HCL 2 MG/2ML IJ SOLN
INTRAMUSCULAR | Status: DC | PRN
  Administered 2024-06-07: 1 mg via INTRAVENOUS

## 2024-06-07 MED ORDER — HEPARIN (PORCINE) IN NACL 1000-0.9 UT/500ML-% IV SOLN
INTRAVENOUS | Status: DC | PRN
  Administered 2024-06-07: 1000 mL via SURGICAL_CAVITY

## 2024-06-07 MED ORDER — HYDRALAZINE HCL 20 MG/ML IJ SOLN: 10.0000 mg | INTRAMUSCULAR | Status: DC | PRN

## 2024-06-07 MED ORDER — ASPIRIN 81 MG PO CHEW: 81.0000 mg | CHEWABLE_TABLET | ORAL | Status: AC

## 2024-06-07 MED ORDER — SODIUM CHLORIDE 0.9 % IV SOLN: INTRAVENOUS | Status: DC

## 2024-06-07 MED ORDER — SODIUM CHLORIDE 0.9% FLUSH: 3.0000 mL | Freq: Two times a day (BID) | INTRAVENOUS | Status: DC

## 2024-06-07 MED ORDER — LABETALOL HCL 5 MG/ML IV SOLN: 10.0000 mg | INTRAVENOUS | Status: DC | PRN

## 2024-06-07 MED ORDER — FENTANYL CITRATE (PF) 100 MCG/2ML IJ SOLN
INTRAMUSCULAR | Status: AC
Start: 2024-06-07 — End: 2024-06-07
  Filled 2024-06-07: qty 2

## 2024-06-07 MED ORDER — ONDANSETRON HCL 4 MG/2ML IJ SOLN
4.0000 mg | Freq: Four times a day (QID) | INTRAMUSCULAR | Status: DC | PRN
Start: 2024-06-07 — End: 2024-06-07

## 2024-06-07 MED ORDER — IOHEXOL 350 MG/ML SOLN
INTRAVENOUS | Status: DC | PRN
  Administered 2024-06-07: 45 mL via INTRA_ARTERIAL

## 2024-06-07 MED ORDER — SODIUM CHLORIDE 0.9 % IV SOLN: 250.0000 mL | INTRAVENOUS | Status: DC | PRN

## 2024-06-07 MED ORDER — HEPARIN SODIUM (PORCINE) 1000 UNIT/ML IJ SOLN
INTRAMUSCULAR | Status: AC
  Filled 2024-06-07: qty 10

## 2024-06-07 SURGICAL SUPPLY — 11 items
CATH 5FR JL3.5 JR4 ANG PIG MP (CATHETERS) IMPLANT
DEVICE RAD COMP TR BAND LRG (VASCULAR PRODUCTS) IMPLANT
GLIDESHEATH SLEND A-KIT 6F 22G (SHEATH) IMPLANT
GLIDESHEATH SLEND SS 6F .021 (SHEATH) IMPLANT
GUIDEWIRE INQWIRE 1.5J.035X260 (WIRE) IMPLANT
KIT ENCORE 26 ADVANTAGE (KITS) IMPLANT
KIT HEMO VALVE WATCHDOG (MISCELLANEOUS) IMPLANT
KIT SYRINGE INJ CVI SPIKEX1 (MISCELLANEOUS) IMPLANT
PACK CARDIAC CATHETERIZATION (CUSTOM PROCEDURE TRAY) ×1 IMPLANT
SET ATX-X65L (MISCELLANEOUS) IMPLANT
WIRE HI TORQ VERSACORE-J 145CM (WIRE) IMPLANT

## 2024-06-07 NOTE — Interval H&P Note (Signed)
 History and Physical Interval Note:  06/07/2024 7:40 AM  Larry Odonnell  has presented today for surgery, with the diagnosis of chest pain.  The various methods of treatment have been discussed with the patient and family. After consideration of risks, benefits and other options for treatment, the patient has consented to  Procedure(s): LEFT HEART CATH AND CORONARY ANGIOGRAPHY (N/A) as a surgical intervention.  The patient's history has been reviewed, patient examined, no change in status, stable for surgery.  I have reviewed the patient's chart and labs.  Questions were answered to the patient's satisfaction.     Arnoldo Lapping

## 2024-06-07 NOTE — Discharge Instructions (Signed)
 Radial Site Care The following information offers guidance on how to care for yourself after your procedure. Your health care provider may also give you more specific instructions. If you have problems or questions, contact your health care provider. What can I expect after the procedure? After the procedure, it is common to have bruising and tenderness in the incision area. Follow these instructions at home: Incision site care  Follow instructions from your health care provider about how to take care of your incision site. Make sure you: Wash your hands with soap and water for at least 20 seconds before and after you change your bandage (dressing). If soap and water are not available, use hand sanitizer. Remove your dressing in 24 hours. Leave stitches (sutures), skin glue, or adhesive strips in place. These skin closures may need to stay in place for 2 weeks or longer. If adhesive strip edges start to loosen and curl up, you may trim the loose edges. Do not remove adhesive strips completely unless your health care provider tells you to do that. Do not take baths, swim, or use a hot tub for at least 1 week. You may shower 24 hours after the procedure or as told by your health care provider. Remove the dressing and gently wash the incision area with plain soap and water. Pat the area dry with a clean towel. Do not rub the site. That could cause bleeding. Do not apply powder or lotion to the site. Check your incision site every day for signs of infection. Check for: Redness, swelling, or pain. Fluid or blood. Warmth. Pus or a bad smell. Activity For 24 hours after the procedure, or as directed by your health care provider: Do not flex or bend the affected arm. Do not push or pull heavy objects with the affected arm. Do not operate machinery or power tools. Do not drive. You should not drive yourself home from the hospital or clinic if you go home during that time period. You may drive 24  hours after the procedure unless your health care provider tells you not to. Do not lift anything that is heavier than 10 lb (4.5 kg), or the limit that you are told, until your health care provider says that it is safe. Return to your normal activities as told by your health care provider. Ask your health care provider what activities are safe for you and when you can return to work. If you were given a sedative during the procedure, it can affect you for several hours. Do not drive or operate machinery until your health care provider says that it is safe. General instructions Take over-the-counter and prescription medicines only as told by your health care provider. If you will be going home right after the procedure, plan to have a responsible adult care for you for the time you are told. This is important. Keep all follow-up visits. This is important. Contact a health care provider if: You have a fever or chills. You have any of these signs of infection at your incision site: Redness, swelling, or pain. Fluid or blood. Warmth. Pus or a bad smell. Get help right away if: The incision area swells very fast. The incision area is bleeding, and the bleeding does not stop when you hold steady pressure on the area. Your arm or hand becomes pale, cool, tingly, or numb. These symptoms may represent a serious problem that is an emergency. Do not wait to see if the symptoms will go away. Get medical  help right away. Call your local emergency services (911 in the [place] Do not drive yourself to the hospital. Summary After the procedure, it is common to have bruising and tenderness at the incision site. Follow instructions from your health care provider about how to take care of your radial site incision. Check the incision every day for signs of infection. Do not lift anything that is heavier than 10 lb (4.5 kg), or the limit that you are told, until your health care provider says that it is  safe. Get help right away if the incision area swells very fast, you have bleeding at the incision site that will not stop, or your arm or hand becomes pale, cool, or numb. This information is not intended to replace advice given to you by your health care provider. Make sure you discuss any questions you have with your health care provider. Document Revised: 01/31/2021 Document Reviewed: 01/31/2021 Elsevier Patient Education  2024 [employer].

## 2024-06-08 ENCOUNTER — Encounter (HOSPITAL_COMMUNITY): Payer: Self-pay | Admitting: Cardiovascular Disease

## 2024-06-14 ENCOUNTER — Ambulatory Visit: Attending: Cardiovascular Disease | Admitting: *Deleted

## 2024-06-14 DIAGNOSIS — I4892 Unspecified atrial flutter: Secondary | ICD-10-CM | POA: Diagnosis not present

## 2024-06-14 DIAGNOSIS — Z5181 Encounter for therapeutic drug level monitoring: Secondary | ICD-10-CM | POA: Diagnosis not present

## 2024-06-14 DIAGNOSIS — I4819 Other persistent atrial fibrillation: Secondary | ICD-10-CM | POA: Diagnosis not present

## 2024-06-14 LAB — POCT INR: INR: 2.4 (ref 2.0–3.0)

## 2024-06-14 NOTE — Patient Instructions (Signed)
 Description   Continue taking warfarin  5mg  daily, except 7.5 mg on Mondays, Wednesdays and Fridays.  Recheck INR in 2 weeks.  Anticoagulation Clinic [phone]

## 2024-06-14 NOTE — Progress Notes (Signed)
Please see anticoagulation encounter.

## 2024-06-18 ENCOUNTER — Other Ambulatory Visit: Payer: Self-pay | Admitting: Cardiology

## 2024-06-18 DIAGNOSIS — I4891 Unspecified atrial fibrillation: Secondary | ICD-10-CM

## 2024-06-25 NOTE — Progress Notes (Unsigned)
 Cardiology Clinic Note   Patient Name: JAFARI MCKILLOP Date of Encounter: 06/27/2024  Primary Care Provider:  Willo Mini, NP Primary Cardiologist:  Maude Emmer, MD  Patient Profile    Layn Kye Lamb 78 year old male presents to the clinic today for follow-up post LHC by Dr. Wonda on 06/07/2024  Past Medical History    Past Medical History:  Diagnosis Date   A-fib [facility])    Allergy    Latex   Anxiety 2006   Cancer [facility]) 2012   Prostate   Cataract 2017   Surgery both eyes   Depression 2006   DVT (deep venous thrombosis) (HCC)    Encounter for monitoring warfarin therapy 07/22/2019   History of colon polyps    1 adenoma: 6- 2018   Hypertension    Kidney stones 01/08/2020   Mixed hyperlipidemia 08/09/2021   Peripheral neuropathy 08/02/2012   Primary osteoarthritis of left hip 11/23/2021   Pulmonary embolism (HCC)    Pulmonary emphysema (HCC) 11/07/2022   Pulmonary emphysema (HCC) 11/07/2022   Tobacco dependence 07/02/2019   Past Surgical History:  Procedure Laterality Date   ATRIAL FIBRILLATION ABLATION N/A 05/08/2023   Procedure: ATRIAL FIBRILLATION ABLATION;  Surgeon: Cindie Ole DASEN, MD;  Location: [facility] INVASIVE CV LAB;  Service: Cardiovascular;  Laterality: N/A;   CARDIOVERSION N/A 07/21/2022   Procedure: CARDIOVERSION;  Surgeon: Emmer Maude BROCKS, MD;  Location: [facility] ENDOSCOPY;  Service: Cardiovascular;  Laterality: N/A;   CARDIOVERSION N/A 02/23/2023   Procedure: CARDIOVERSION;  Surgeon: Emmer Maude BROCKS, MD;  Location: [facility] ENDOSCOPY;  Service: Cardiovascular;  Laterality: N/A;   CARDIOVERSION N/A 03/22/2024   Procedure: CARDIOVERSION;  Surgeon: Pietro Redell RAMAN, MD;  Location: [facility] INVASIVE CV LAB;  Service: Cardiovascular;  Laterality: N/A;   CORONARY ANGIOGRAPHY N/A 06/07/2024   Procedure: CORONARY ANGIOGRAPHY;  Surgeon: Wonda Sharper, MD;  Location: [facility] INVASIVE CV LAB;  Service: Cardiovascular;  Laterality: N/A;   EYE SURGERY  2017   Cataracts    PROSTATECTOMY     TEE WITHOUT CARDIOVERSION N/A 02/23/2023   Procedure: TRANSESOPHAGEAL ECHOCARDIOGRAM (TEE);  Surgeon: Emmer Maude BROCKS, MD;  Location: [facility] ENDOSCOPY;  Service: Cardiovascular;  Laterality: N/A;   TONSILLECTOMY     TRANSESOPHAGEAL ECHOCARDIOGRAM (CATH LAB) N/A 03/22/2024   Procedure: TRANSESOPHAGEAL ECHOCARDIOGRAM;  Surgeon: Pietro Redell RAMAN, MD;  Location: [facility] INVASIVE CV LAB;  Service: Cardiovascular;  Laterality: N/A;    Allergies  Allergies  Allergen Reactions   Amlodipine  Other (See Comments)    Caused the patient to fall   Atorvastatin  Other (See Comments)    Pt reports causes leg pains and leg heaviness   Oxycodone Other (See Comments)    Causes extreme dizziness and falling down   Latex Rash    History of Present Illness    JAKEB LAMPING has a PMH of atrial fibrillation status post ablation 5/24, HTN, HLD, tobacco dependence, coronary artery disease (coronary calcium  score 806 on 05/04/2023 via CTA), aortic atherosclerosis, and pulmonary embolus.  He underwent ablation 5/24.  He continued to feel not well in sinus rhythm.  He did not tolerate amiodarone  due to brain fog dizziness and fatigue.  His symptoms improved with stopping amiodarone .  MRI of his head 9/24 was negative.  He smokes half pack per day.  On his CT he was noted to have aortic atherosclerosis and three-vessel coronary disease.  Carotid duplex 9/24 showed some plaque and no stenosis.  TEE 02/23/2023 showed an EF of 60-65% and mild MR.  He was  seen in the emergency department on 08/26/2019 for.  He reported chest pain.  CT angio chest was negative for PE.  He reported compliance with his Coumadin .  He had therapeutic INR at that time.  He was seen in follow-up 02/13/2024 by Dr. Delford.  He was maintaining sinus rhythm in the 80s.  He was advised to undergo cardiac PET/CT for evaluation of ischemia due to his elevated calcium  score.  His blood pressure was elevated.  He was advised to continue metoprolol   and lisinopril .  Follow-up in 1 year was recommended.  He was seen in my clinic by NP on 03/11/2024.  He was referred to EP for further evaluation of his tachycardia/atrial flutter.  He was advised to reduce his lisinopril  to 5 mg daily.  He denied missed doses of his Coumadin .  He denied chest pain shortness of breath and anginal type symptoms.  His BP was noted to be low in the setting of increasing his beta-blocker for heart rate control  He underwent successful DCCV on 03/22/2024.  He received 1 shock with 200 J and converted to sinus rhythm.  He was continued on Coumadin .  He tolerated the procedure well.  He followed up with Dr. Cindie for 1025.  He was doing well at that time.  He denied recurrent arrhythmia.  Plan for continue to monitor heart rhythms.  In the occurrence of repeat arrhythmia it was felt that he may need repeat catheter ablation due to his prior intolerance of amiodarone   He presented to the clinic 05/21/24 for follow-up evaluation and stated he felt like his strength had  not yet returned.  He attributed this to age.  He noted that he had not had any chest pain or sense of fast heart rate.  We reviewed his cardioversion.  He denied bleeding issues.  We reviewed his cardiac PET/CT.  He expressed understanding.  We reviewed options for treatment.  He wished to proceed with cardiac catheterization.  He noted that on his last INR check his INR was 2.6.  We planned to coordinate his cardiac catheterization with Coumadin  clinic.  I reviewed risks for the procedure.  He expressed understanding.  I planned follow-up after catheterization.  I  ordered CBC and BMP.  His blood pressure was well-controlled at 128/72.  He underwent cardiac catheterization on 06/07/2024.  He was noted to have total occlusion of his mid RCA with left-to-right collateral.  Medical management was recommended.  He presents to the clinic today for follow-up evaluation and states he has not had any episodes of chest  pain.  He does occasionally note weakness in his legs.  The symptoms dissipate with rest and being stationary.  We reviewed his cardiac catheterization.  He expressed understanding.  We reviewed the importance of high-fiber diet and lowering cholesterol.  I will order fasting lipids today and LFTs.  I will have him maintain his physical activity and provide high-fiber diet information.  We will continue his current medication regimen and plan follow-up in 3 to 4 months.  Today he denies chest pain, shortness of breath, lower extremity edema, palpitations, melena, hematuria, hemoptysis, diaphoresis, weakness, presyncope, syncope, orthopnea, and PND.     Home Medications    Prior to Admission medications   Medication Sig Start Date End Date Taking? Authorizing Provider  amitriptyline  (ELAVIL ) 50 MG tablet Take 1-2 tablets (50-100 mg total) by mouth 2 (two) times daily as needed (Depression). 05/06/24   Willo Mini, NP  cholecalciferol (VITAMIN D) 25 MCG (1000  UNIT) tablet Take 1,000 Units by mouth daily.    [provider]  clonazePAM  (KLONOPIN ) 0.5 MG tablet Take 1 tablet (0.5 mg total) by mouth daily as needed for anxiety. 02/27/24   Jessup, Joy, NP  Melatonin 10 MG TABS Take 10 mg by mouth at bedtime as needed (Sleep).    [provider]  metoprolol  succinate (TOPROL -XL) 50 MG 24 hr tablet Take 1 tablet (50 mg total) by mouth 2 (two) times daily. Take with or immediately following a meal. 03/11/24   Swinyer, Rosaline HERO, NP  nitroGLYCERIN  (NITROSTAT ) 0.4 MG SL tablet Place 1 tablet (0.4 mg total) under the tongue every 5 (five) minutes as needed for chest pain. 05/03/24   Delford Maude BROCKS, MD  rosuvastatin  (CRESTOR ) 10 MG tablet Take 1 tablet (10 mg total) by mouth daily. 02/28/24   Nishan, Peter C, MD  warfarin (COUMADIN ) 5 MG tablet TAKE 1 TABLET BY MOUTH  DAILY AS DIRECTED BY COUMADIN   CLINIC Patient taking differently: Take 5 mg by mouth See admin instructions. Take 5 mg on Tues,  Thurs., Sat. and Sun., In the morning DIRECTED BY COUMADIN   CLINIC 01/18/24   Cindie Ole DASEN, MD    Family History    Family History  Problem Relation Age of Onset   Anxiety disorder Mother    Cancer Mother    Stroke Mother    Alcohol abuse Father    Early death Father    Anxiety disorder Brother    Depression Brother    Anxiety disorder Daughter    Depression Daughter    Esophageal cancer Neg Hx    He indicated that the status of his mother is unknown. He indicated that the status of his father is unknown. He indicated that the status of his brother is unknown. He indicated that the status of his daughter is unknown. He indicated that the status of his neg hx is unknown.  Social History    Social History   Socioeconomic History   Marital status: Married    Spouse name: Therisa CHARLENA Sam   Number of children: 1   Years of education: 13   Highest education level: Some college, no degree  Occupational History   Occupation: Retired  Tobacco Use   Smoking status: Former    Current packs/day: 0.00    Average packs/day: 1.2 packs/day for 52.4 years (61.4 ttl pk-yrs)    Types: Cigarettes    Start date: 2020    Quit date: 05/16/2024    Years since quitting: 0.1   Smokeless tobacco: Never   Tobacco comments:    Quit 1990-2015  Vaping Use   Vaping status: Never Used  Substance and Sexual Activity   Alcohol use: Not Currently    Comment: No alcohol since 1995   Drug use: Never   Sexual activity: Not Currently    Birth control/protection: None  Other Topics Concern   Not on file  Social History Narrative   Lives with wife. He enjoys yardwork.   Social Drivers of Corporate investment banker Strain: Low Risk  (12/31/2023)   Overall Financial Resource Strain (CARDIA)    Difficulty of Paying Living Expenses: Not hard at all  Food Insecurity: No Food Insecurity (12/31/2023)   Hunger Vital Sign    Worried About Running Out of Food in the Last Year: Never true    Ran Out of  Food in the Last Year: Never true  Transportation Needs: No Transportation Needs (12/31/2023)   PRAPARE -  Administrator, Civil Service (Medical): No    Lack of Transportation (Non-Medical): No  Physical Activity: Insufficiently Active (12/31/2023)   Exercise Vital Sign    Days of Exercise per Week: 2 days    Minutes of Exercise per Session: 20 min  Stress: No Stress Concern Present (12/31/2023)   [employer] of Occupational Health - Occupational Stress Questionnaire    Feeling of Stress : Not at all  Social Connections: Socially Integrated (12/31/2023)   Social Connection and Isolation Panel    Frequency of Communication with Friends and Family: Twice a week    Frequency of Social Gatherings with Friends and Family: Once a week    Attends Religious Services: More than 4 times per year    Active Member of [employer] or Organizations: Yes    Attends Engineer, structural: More than 4 times per year    Marital Status: Married  Catering manager Violence: Not At Risk (08/22/2022)   Humiliation, Afraid, Rape, and Kick questionnaire    Fear of Current or Ex-Partner: No    Emotionally Abused: No    Physically Abused: No    Sexually Abused: No     Review of Systems    General:  No chills, fever, night sweats or weight changes.  Cardiovascular:  No chest pain, dyspnea on exertion, edema, orthopnea, palpitations, paroxysmal nocturnal dyspnea. Dermatological: No rash, lesions/masses Respiratory: No cough, dyspnea Urologic: No hematuria, dysuria Abdominal:   No nausea, vomiting, diarrhea, bright red blood per rectum, melena, or hematemesis Neurologic:  No visual changes, wkns, changes in mental status. All other systems reviewed and are otherwise negative except as noted above.  Physical Exam    VS:  BP 126/80   Pulse 80   Ht 6' 1 (1.854 m)   Wt 201 lb (91.2 kg)   SpO2 97%   BMI 26.52 kg/m  , BMI Body mass index is 26.52 kg/m. GEN: Well nourished, well developed, in  no acute distress. HEENT: normal. Neck: Supple, no JVD, carotid bruits, or masses. Cardiac: RRR, no murmurs, rubs, or gallops. No clubbing, cyanosis, edema.  Radials/DP/PT 2+ and equal bilaterally.  Respiratory:  Respirations regular and unlabored, clear to auscultation bilaterally. GI: Soft, nontender, nondistended, BS + x 4. MS: no deformity or atrophy. Skin: warm and dry, no rash. Neuro:  Strength and sensation are intact. Psych: Normal affect.  Accessory Clinical Findings    Recent Labs: 08/22/2023: ALT 27; Magnesium 2.2; TSH 1.520 05/21/2024: BUN 17; Creatinine, Ser 1.14; Hemoglobin 14.6; Platelets 245; Potassium 5.2; Sodium 140   Recent Lipid Panel    Component Value Date/Time   CHOL 166 06/01/2021 0915   TRIG 76 06/01/2021 0915   HDL 46 06/01/2021 0915   CHOLHDL 3.6 06/01/2021 0915   LDLCALC 106 (H) 06/01/2021 0915         ECG personally reviewed by me today-  none today.     Echocardiogram 03/22/2024  IMPRESSIONS     1. Left ventricular ejection fraction, by estimation, is 55 to 60%. The  left ventricle has normal function. The left ventricle has no regional  wall motion abnormalities.   2. Right ventricular systolic function is normal. The right ventricular  size is normal.   3. Left atrial size was mildly dilated. No left atrial/left atrial  appendage thrombus was detected.   4. The mitral valve is normal in structure. Mild mitral valve  regurgitation.   5. The aortic valve is tricuspid. Aortic valve regurgitation is not  visualized.   6. There is mild (Grade II) plaque involving the descending aorta.   Conclusion(s)/Recommendation(s): No LA/LAA thrombus identified. Successful  cardioversion performed with restoration of normal sinus rhythm.   FINDINGS   Left Ventricle: Left ventricular ejection fraction, by estimation, is 55  to 60%. The left ventricle has normal function. The left ventricle has no  regional wall motion abnormalities. The left  ventricular internal cavity  size was normal in size.   Right Ventricle: The right ventricular size is normal. Right ventricular  systolic function is normal.   Left Atrium: Left atrial size was mildly dilated. No left atrial/left  atrial appendage thrombus was detected.   Right Atrium: Right atrial size was normal in size.   Pericardium: There is no evidence of pericardial effusion.   Mitral Valve: The mitral valve is normal in structure. Mild mitral valve  regurgitation.   Tricuspid Valve: The tricuspid valve is normal in structure. Tricuspid  valve regurgitation is mild.   Aortic Valve: The aortic valve is tricuspid. Aortic valve regurgitation is  not visualized.   Pulmonic Valve: The pulmonic valve was normal in structure. Pulmonic valve  regurgitation is not visualized.   Aorta: The aortic root is normal in size and structure. There is mild  (Grade II) plaque involving the descending aorta.   IAS/Shunts: No atrial level shunt detected by color flow Doppler.      LHC 06/07/2024    Mid RCA lesion is 100% stenosed.   Mid Cx lesion is 50% stenosed.   1st Diag lesion is 60% stenosed.   Total occlusion of the mid-RCA with left-to-right collateral. This is a small, co-dominant vessel Patent left main with no stenosis Patent LAD with mild diffuse plaquing and no focal high-grade stenoses.  Moderate stenosis of a large diagonal (50-60%) Moderate 50% stenosis of the mid circumflex   Patient with a chronic occlusion of a small codominant RCA and mild to moderate diffuse disease elsewhere.  Recommend aggressive medical therapy with lipid-lowering goal LDL less than 55 mg/dL.  The patient anticoagulated with warfarin and can start back on this tonight.  No indication for PCI or CABG.  Diagnostic Dominance: Co-dominant  Intervention   Assessment & Plan   1.  Coronary artery disease-denies chest pain and anginal type symptoms.  Cardiac catheterization showed total occlusion  of the mid RCA with left-to-right collaterals.  Medical management was recommended.  Details above.  His PET/CT showed high risk, small defect with marked reduction in global myocardial flow reserve suggesting multivessel coronary disease EF was noted to be 61% (stress).  Right radial cath site healed well.  No signs of infection. Heart healthy low-sodium diet Continue metoprolol , rosuvastatin   Hyperlipidemia-LDL 106 on 06/01/21.  High-fiber diet Increase physical activity as tolerated Continue rosuvastatin  Repeat fasting lipids and LFTs  Atrial fibrillation-Hr today 80 bpm.  Compliant with Coumadin .  Denies bleeding issues. Continue metoprolol , Coumadin  Avoid triggers caffeine, chocolate, EtOH, dehydration etc. Following with EP  Essential hypertension -BP today 126/80 Maintain blood pressure log Heart healthy low-sodium diet Continue metoprolol   Disposition: Follow-up with Dr. Nishan or me in 3-4 months.  Josefa HERO. Jaynie Hitch NP-C     06/27/2024, 8:51 AM [facility] [address] Office [phone] Fax [phone]    I spent 14 minutes examining this patient, reviewing medications, and using patient centered shared decision making involving their cardiac care.   I spent  20 minutes reviewing past medical history,  medications, and prior cardiac tests.

## 2024-06-27 ENCOUNTER — Ambulatory Visit: Attending: General Practice | Admitting: General Practice

## 2024-06-27 ENCOUNTER — Ambulatory Visit: Admitting: Podiatry

## 2024-06-27 ENCOUNTER — Encounter: Payer: Self-pay | Admitting: General Practice

## 2024-06-27 ENCOUNTER — Ambulatory Visit: Attending: Cardiovascular Disease | Admitting: *Deleted

## 2024-06-27 ENCOUNTER — Other Ambulatory Visit: Payer: Self-pay

## 2024-06-27 VITALS — BP 126/80 | HR 80 | Ht 73.0 in | Wt 201.0 lb

## 2024-06-27 DIAGNOSIS — I4819 Other persistent atrial fibrillation: Secondary | ICD-10-CM

## 2024-06-27 DIAGNOSIS — I4892 Unspecified atrial flutter: Secondary | ICD-10-CM

## 2024-06-27 DIAGNOSIS — I2510 Atherosclerotic heart disease of native coronary artery without angina pectoris: Secondary | ICD-10-CM

## 2024-06-27 DIAGNOSIS — E785 Hyperlipidemia, unspecified: Secondary | ICD-10-CM

## 2024-06-27 DIAGNOSIS — I10 Essential (primary) hypertension: Secondary | ICD-10-CM | POA: Diagnosis not present

## 2024-06-27 DIAGNOSIS — Z5181 Encounter for therapeutic drug level monitoring: Secondary | ICD-10-CM

## 2024-06-27 LAB — POCT INR: POC INR: 4.1

## 2024-06-27 NOTE — Patient Instructions (Addendum)
 Description   Hold warfarin today then Continue taking warfarin  5mg  daily, except 7.5 mg on Mondays, Wednesdays and Fridays. Eat an extra serving of greens today.  Recheck INR in 2 weeks.  Anticoagulation Clinic [phone]

## 2024-06-27 NOTE — Progress Notes (Signed)
 Needs lipid profile order that is for [facility], no [facility]. Placed new order.

## 2024-06-27 NOTE — Patient Instructions (Addendum)
 Medication Instructions:  Your physician recommends that you continue on your current medications as directed. Please refer to the Current Medication list given to you today.  *If you need a refill on your cardiac medications before your next appointment, please call your pharmacy*  Lab Work: LIPIDS AND LFTS TODAY, OUR LAB IS ON THE FIRST FLOOR If you have labs (blood work) drawn today and your tests are completely normal, you will receive your results only by: MyChart Message (if you have MyChart) OR A paper copy in the mail If you have any lab test that is abnormal or we need to change your treatment, we will call you to review the results.  Testing/Procedures: NONE  Follow-Up: At [facility], you and your health needs are our priority.  As part of our continuing mission to provide you with exceptional heart care, our providers are all part of one team.  This team includes your primary Cardiologist (physician) and Advanced Practice Providers or APPs (Physician Assistants and Nurse Practitioners) who all work together to provide you with the care you need, when you need it.  Your next appointment:   3-4 month(s)  Provider:   Maude Emmer, MD or Josefa Beauvais, NP       Other Instructions

## 2024-06-27 NOTE — Progress Notes (Signed)
Please see anticoagulation encounter.

## 2024-06-28 LAB — LIPID PANEL
Chol/HDL Ratio: 4.1 ratio (ref 0.0–5.0)
Cholesterol, Total: 175 mg/dL (ref 100–199)
HDL: 43 mg/dL (ref >39)
LDL Chol Calc (NIH): 110 mg/dL — ABNORMAL HIGH (ref 0–99)
Triglycerides: 122 mg/dL (ref 0–149)
VLDL Cholesterol Cal: 22 mg/dL (ref 5–40)

## 2024-06-28 LAB — HEPATIC FUNCTION PANEL
ALT: 15 IU/L (ref 0–44)
AST: 19 IU/L (ref 0–40)
Albumin: 4.3 g/dL (ref 3.8–4.8)
Alkaline Phosphatase: 74 IU/L (ref 44–121)
Bilirubin Total: 0.3 mg/dL (ref 0.0–1.2)
Bilirubin, Direct: 0.12 mg/dL (ref 0.00–0.40)
Total Protein: 7.2 g/dL (ref 6.0–8.5)

## 2024-07-01 ENCOUNTER — Ambulatory Visit: Payer: Self-pay | Admitting: General Practice

## 2024-07-02 ENCOUNTER — Other Ambulatory Visit (HOSPITAL_COMMUNITY): Payer: Self-pay

## 2024-07-02 MED ORDER — ROSUVASTATIN CALCIUM 20 MG PO TABS
20.0000 mg | ORAL_TABLET | Freq: Every day | ORAL | 3 refills | Status: DC
  Filled 2024-07-02: qty 90, 90d supply, fill #0

## 2024-07-02 MED ORDER — ROSUVASTATIN CALCIUM 20 MG PO TABS: 20.0000 mg | ORAL_TABLET | Freq: Every day | ORAL | 3 refills | Status: DC

## 2024-07-02 NOTE — Addendum Note (Signed)
 Addended by: ANDREZ PRAIRIE on: 07/02/2024 12:31 PM   Modules accepted: Orders

## 2024-07-04 ENCOUNTER — Other Ambulatory Visit: Payer: Self-pay | Admitting: *Deleted

## 2024-07-04 ENCOUNTER — Encounter: Payer: Self-pay | Admitting: Cardiovascular Disease

## 2024-07-04 MED ORDER — METOPROLOL SUCCINATE ER 50 MG PO TB24: 50.0000 mg | ORAL_TABLET | Freq: Two times a day (BID) | ORAL | 0 refills | Status: DC

## 2024-07-04 NOTE — Telephone Encounter (Signed)
 Requested Prescriptions   Signed Prescriptions Disp Refills   metoprolol  succinate (TOPROL -XL) 50 MG 24 hr tablet 180 tablet 0    Sig: Take 1 tablet (50 mg total) by mouth 2 (two) times daily. Take with or immediately following a meal.    Authorizing Provider: PERCY ROSALINE HERO    Ordering User: Koi Yarbro  C

## 2024-07-05 ENCOUNTER — Encounter: Payer: Self-pay | Admitting: Podiatry

## 2024-07-05 ENCOUNTER — Ambulatory Visit: Admitting: Podiatry

## 2024-07-05 DIAGNOSIS — L600 Ingrowing nail: Secondary | ICD-10-CM | POA: Diagnosis not present

## 2024-07-05 NOTE — Progress Notes (Signed)
 Subjective:  Patient ID: Larry Odonnell, male    DOB: 11/21/1946,   MRN: [id number]  Chief Complaint  Patient presents with   Nail Problem    I'm hoping she's going to take the right big toenail off today.    78 y.o. male presents for concern as above. New concern of pain in the toe nails and ingrowns that he is tired of dealing with. He is requesting to have the great toenails and right second toenail removed. He is on blood thinners and at risk for rfc.  Denies any other pedal complaints. Denies n/v/f/c.   Past Medical History:  Diagnosis Date   A-fib [facility])    Allergy    Latex   Anxiety 2006   Cancer [facility]) 2012   Prostate   Cataract 2017   Surgery both eyes   Depression 2006   DVT (deep venous thrombosis) (HCC)    Encounter for monitoring warfarin therapy 07/22/2019   History of colon polyps    1 adenoma: 6- 2018   Hypertension    Kidney stones 01/08/2020   Mixed hyperlipidemia 08/09/2021   Peripheral neuropathy 08/02/2012   Primary osteoarthritis of left hip 11/23/2021   Pulmonary embolism (HCC)    Pulmonary emphysema (HCC) 11/07/2022   Pulmonary emphysema (HCC) 11/07/2022   Tobacco dependence 07/02/2019    Objective:  Physical Exam: Vascular: DP/PT pulses 2/4 bilateral. CFT <3 seconds. Normal hair growth on digits. No edema.  Skin. No lacerations or abrasions bilateral feet. Nails 1-5 bilateral are thickened and elongated. Bilateral hallux nails and right second with subungual debris and incuvation noted to medial left hallux nail.  Musculoskeletal: MMT 5/5 bilateral lower extremities in DF, PF, Inversion and Eversion. Deceased ROM in DF of ankle joint.  Neurological: Sensation intact to light touch.   Assessment:   1. Ingrown right greater toenail   2. Ingrown left greater toenail   3. Ingrown nail of second toe of right foot       Plan:  Patient was evaluated and treated and all questions answered. Discussed ingrown toenails etiology and treatment  options including procedure for removal vs conservative care.  Patient requesting removal of ingrown nail today. Procedure below.  Discussed procedure and post procedure care and patient expressed understanding.  Will follow-up in 2 weeks for nail check or sooner if any problems arise.    Procedure:  Procedure: total Nail Avulsion of right hallux and second digit and left hallux nails  Surgeon: Asberry Failing, DPM  Pre-op Dx: Ingrown toenail without infection Post-op: Same  Place of Surgery: Office exam room.  Indications for surgery: Painful and ingrown toenail.    The patient is requesting removal of nail with  chemical matrixectomy. Risks and complications were discussed with the patient for which they understand and written consent was obtained. Under sterile conditions a total of 3 mL of  1% lidocaine  plain was infiltrated in a hallux block fashion. Once anesthetized, the skin was prepped in sterile fashion. A tourniquet was then applied. Next the entire bilateral hallux nail and right second digit nails were removed. SABRA  Next phenol was then applied under standard conditions to permanently destroy the matrix and copiously irrigated. Silvadene was applied. A dry sterile dressing was applied. After application of the dressing the tourniquet was removed and there is found to be an immediate capillary refill time to the digit. The patient tolerated the procedure well without any complications. Post procedure instructions were discussed the patient for which he verbally understood.  Follow-up in two weeks for nail check or sooner if any problems are to arise. Discussed signs/symptoms of infection and directed to call the office immediately should any occur or go directly to the emergency room. In the meantime, encouraged to call the office with any questions, concerns, changes symptoms.    Asberry Failing, DPM

## 2024-07-05 NOTE — Patient Instructions (Signed)

## 2024-07-10 ENCOUNTER — Other Ambulatory Visit: Payer: Self-pay | Admitting: Medical-Surgical

## 2024-07-11 ENCOUNTER — Ambulatory Visit: Attending: Cardiovascular Disease | Admitting: *Deleted

## 2024-07-11 DIAGNOSIS — I4892 Unspecified atrial flutter: Secondary | ICD-10-CM

## 2024-07-11 DIAGNOSIS — Z5181 Encounter for therapeutic drug level monitoring: Secondary | ICD-10-CM

## 2024-07-11 LAB — POCT INR: POC INR: 3.5

## 2024-07-11 NOTE — Patient Instructions (Signed)
 Description   Hold warfarin today then START taking 5 mg daily except for 7.5 mg on Mondays and Fridays. Eat an extra serving of greens today.  Recheck INR in 2 weeks.  Anticoagulation Clinic [phone]

## 2024-07-11 NOTE — Progress Notes (Signed)
Please see anticoagulation encounter.

## 2024-07-19 ENCOUNTER — Telehealth: Payer: Self-pay | Admitting: Cardiovascular Disease

## 2024-07-19 DIAGNOSIS — E785 Hyperlipidemia, unspecified: Secondary | ICD-10-CM

## 2024-07-19 NOTE — Telephone Encounter (Signed)
 Patient is following up to get his 10-day lab orders placed with [facility].

## 2024-07-19 NOTE — Telephone Encounter (Signed)
 Left message for pt to call back to discuss what lab he was instructed to have drawn.  Unable to locate any recent lab orders for this pt in his chart.

## 2024-07-25 ENCOUNTER — Ambulatory Visit: Attending: Cardiovascular Disease | Admitting: *Deleted

## 2024-07-25 DIAGNOSIS — I4892 Unspecified atrial flutter: Secondary | ICD-10-CM

## 2024-07-25 DIAGNOSIS — Z5181 Encounter for therapeutic drug level monitoring: Secondary | ICD-10-CM

## 2024-07-25 LAB — POCT INR: POC INR: 2.3

## 2024-07-25 NOTE — Patient Instructions (Signed)
 Description   Continue taking 5 mg daily except for 7.5 mg on Mondays and Fridays.  Recheck INR in 4 weeks.  Anticoagulation Clinic [phone]

## 2024-07-25 NOTE — Progress Notes (Signed)
 INR 2.3. Please see anticoagulation encounter

## 2024-07-26 ENCOUNTER — Ambulatory Visit: Admitting: Podiatry

## 2024-07-26 ENCOUNTER — Encounter: Payer: Self-pay | Admitting: Podiatry

## 2024-07-26 DIAGNOSIS — L600 Ingrowing nail: Secondary | ICD-10-CM

## 2024-07-26 NOTE — Progress Notes (Signed)
  Subjective:  Patient ID: Larry Odonnell, male    DOB: 07-15-1946,   MRN: [id number]  No chief complaint on file.   78 y.o. male presents for follow-up of removal of nails on right hallux and second digit and left hallux.  He is on blood thinners and at risk for rfc.  Denies any other pedal complaints. Denies n/v/f/c.   Past Medical History:  Diagnosis Date   A-fib [facility])    Allergy    Latex   Anxiety 2006   Cancer [facility]) 2012   Prostate   Cataract 2017   Surgery both eyes   Depression 2006   DVT (deep venous thrombosis) (HCC)    Encounter for monitoring warfarin therapy 07/22/2019   History of colon polyps    1 adenoma: 6- 2018   Hypertension    Kidney stones 01/08/2020   Mixed hyperlipidemia 08/09/2021   Peripheral neuropathy 08/02/2012   Primary osteoarthritis of left hip 11/23/2021   Pulmonary embolism (HCC)    Pulmonary emphysema (HCC) 11/07/2022   Pulmonary emphysema (HCC) 11/07/2022   Tobacco dependence 07/02/2019    Objective:  Physical Exam: Vascular: DP/PT pulses 2/4 bilateral. CFT <3 seconds. Normal hair growth on digits. No edema.  Skin. No lacerations or abrasions bilateral feet. Nails 1-5 bilateral are thickened and elongated. Bilateral hallux nails and right second  nail bed healing well.  Musculoskeletal: MMT 5/5 bilateral lower extremities in DF, PF, Inversion and Eversion. Deceased ROM in DF of ankle joint.  Neurological: Sensation intact to light touch.   Assessment:   1. Ingrown right greater toenail       Plan:  Patient was evaluated and treated and all questions answered. Toe was evaluated and appears to be healing well.  May discontinue soaks and neosporin.  Patient to follow-up as needed.      Asberry Failing, DPM

## 2024-07-29 NOTE — Telephone Encounter (Signed)
 Spoke with pt, questions regarding follow up labs answered. Lab orders mailed to the pt

## 2024-08-12 ENCOUNTER — Encounter: Payer: Self-pay | Admitting: Sports Medicine

## 2024-08-16 ENCOUNTER — Other Ambulatory Visit (INDEPENDENT_AMBULATORY_CARE_PROVIDER_SITE_OTHER)

## 2024-08-16 ENCOUNTER — Encounter: Payer: Self-pay | Admitting: Sports Medicine

## 2024-08-16 ENCOUNTER — Ambulatory Visit: Admitting: Sports Medicine

## 2024-08-16 DIAGNOSIS — M1612 Unilateral primary osteoarthritis, left hip: Secondary | ICD-10-CM | POA: Diagnosis not present

## 2024-08-16 MED ORDER — TRIAMCINOLONE ACETONIDE 40 MG/ML IJ SUSP
40.0000 mg | Freq: Once | INTRAMUSCULAR | Status: AC
  Administered 2024-08-16: 40 mg via INTRA_ARTICULAR

## 2024-08-16 NOTE — Progress Notes (Signed)
    Procedures performed today:    Procedure: Real-time Ultrasound Guided injection of the left hip joint Device: Samsung HS60  Verbal informed consent obtained.  Time-out conducted.  Noted no overlying erythema, induration, or other signs of local infection.  Skin prepped in a sterile fashion.  Local anesthesia: Topical Ethyl chloride.  With sterile technique and under real time ultrasound guidance: Arthritic joint noted, 1 cc Kenalog  40, 2 cc lidocaine , 2 cc bupivacaine injected easily Completed without difficulty  Advised to call if fevers/chills, erythema, induration, drainage, or persistent bleeding.  Images permanently stored and available for review in PACS.  Impression: Technically successful ultrasound guided injection.  Independent interpretation of notes and tests performed by another provider:   None.  Brief History, Exam, Impression, and Recommendations:    Primary osteoarthritis of left hip Very pleasant 78 year old male, known left hip osteoarthritis, last injected December 2023, then January 2025, repeated today, return as needed.    ____________________________________________ Debby PARAS. Curtis, M.D., ABFM., CAQSM., AME. Primary Care and Sports Medicine [facility]  Adjunct Professor of [facility]  [employer]  Restaurant manager, [employer]

## 2024-08-16 NOTE — Assessment & Plan Note (Signed)
 Very pleasant 78 year old male, known left hip osteoarthritis, last injected December 2023, then January 2025, repeated today, return as needed.

## 2024-08-16 NOTE — Addendum Note (Signed)
 Addended by: OLEY CHIQUITA CROME on: 08/16/2024 11:36 AM   Modules accepted: Orders

## 2024-08-22 ENCOUNTER — Ambulatory Visit: Attending: Cardiovascular Disease | Admitting: Pharmacist

## 2024-08-22 DIAGNOSIS — I824Y9 Acute embolism and thrombosis of unspecified deep veins of unspecified proximal lower extremity: Secondary | ICD-10-CM | POA: Diagnosis not present

## 2024-08-22 DIAGNOSIS — I4892 Unspecified atrial flutter: Secondary | ICD-10-CM

## 2024-08-22 DIAGNOSIS — I4819 Other persistent atrial fibrillation: Secondary | ICD-10-CM

## 2024-08-22 DIAGNOSIS — I2693 Single subsegmental pulmonary embolism without acute cor pulmonale: Secondary | ICD-10-CM | POA: Diagnosis not present

## 2024-08-22 LAB — POCT INR: INR: 3.5 — AB (ref 2.0–3.0)

## 2024-08-22 NOTE — Patient Instructions (Signed)
 Description   Hold dose today and then continue taking 5 mg daily except for 7.5 mg on Mondays and Fridays.  Recheck INR in 2 weeks.  Anticoagulation Clinic [phone]

## 2024-08-22 NOTE — Progress Notes (Signed)
 INR 3.5; Please see anticoagulation encounter   Description   Hold dose today and then continue taking 5 mg daily except for 7.5 mg on Mondays and Fridays.  Recheck INR in 2 weeks.  Anticoagulation Clinic [phone]

## 2024-08-25 ENCOUNTER — Other Ambulatory Visit: Payer: Self-pay | Admitting: Nurse Practitioner

## 2024-08-27 ENCOUNTER — Encounter: Payer: Self-pay | Admitting: Sports Medicine

## 2024-09-04 ENCOUNTER — Ambulatory Visit

## 2024-09-05 ENCOUNTER — Ambulatory Visit

## 2024-09-05 VITALS — Ht 73.0 in | Wt 205.0 lb

## 2024-09-05 DIAGNOSIS — Z1211 Encounter for screening for malignant neoplasm of colon: Secondary | ICD-10-CM

## 2024-09-05 DIAGNOSIS — Z0000 Encounter for general adult medical examination without abnormal findings: Secondary | ICD-10-CM | POA: Diagnosis not present

## 2024-09-05 NOTE — Patient Instructions (Signed)
  [name] , Thank you for taking time to come for your Medicare Wellness Visit. I appreciate your ongoing commitment to your health goals. Please review the following plan we discussed and let me know if I can assist you in the future.   These are the goals we discussed:  Goals       Patient Stated (pt-stated)      He would like to maintain his healthy lifestyle.      Patient Stated (pt-stated)      Patient stated that he would like to continue to stay healthy.      Patient Stated      Patient states he would like to have better heart health.         This is a list of the screening recommended for you and due dates:  Health Maintenance  Topic Date Due   Colon Cancer Screening  06/06/2022   Flu Shot  03/25/2025*   DTaP/Tdap/Td vaccine (2 - Td or Tdap) 05/06/2025*   Screening for Lung Cancer  11/05/2024   Medicare Annual Wellness Visit  09/05/2025   Pneumococcal Vaccine for age over 41  Completed   Hepatitis C Screening  Completed   Zoster (Shingles) Vaccine  Completed   HPV Vaccine  Aged Out   Meningitis B Vaccine  Aged Out   COVID-19 Vaccine  Discontinued  *Topic was postponed. The date shown is not the original due date.

## 2024-09-05 NOTE — Progress Notes (Signed)
 Subjective:   Larry Odonnell is a 78 y.o. male who presents for Medicare Annual/Subsequent preventive examination.  Visit Complete: Virtual I connected with  [name] on 09/05/24 by a audio enabled telemedicine application and verified that I am speaking with the correct person using two identifiers.  Patient Location: Home  Provider Location: Office/Clinic  I discussed the limitations of evaluation and management by telemedicine. The patient expressed understanding and agreed to proceed.  Vital Signs: Because this visit was a virtual/telehealth visit, some criteria may be missing or patient reported. Any vitals not documented were not able to be obtained and vitals that have been documented are patient reported.  Patient Medicare AWV questionnaire was completed by the patient on 09/02/2024; I have confirmed that all information answered by patient is correct and no changes since this date.  Cardiac Risk Factors include: advanced age (>72men, >59 women);male gender;hypertension;dyslipidemia     Objective:    Today's Vitals   09/05/24 0858  Weight: 205 lb (93 kg)  Height: 6' 1 (1.854 m)   Body mass index is 27.05 kg/m.     09/05/2024    9:05 AM 06/07/2024    6:08 AM 08/29/2023    8:26 AM 05/08/2023    5:42 AM 02/23/2023    8:19 AM 08/22/2022    9:07 AM 07/21/2022    1:28 PM  Advanced Directives  Does Patient Have a Medical Advance Directive? Yes Yes Yes Yes Yes Yes Yes  Type of Estate agent of [place];Living will Living will;Healthcare Power of Attorney Living will;Healthcare Power of Attorney Living will Healthcare Power of Attorney Living will Healthcare Power of [place];Living will  Does patient want to make changes to medical advance directive? No - Patient declined  No - Patient declined No - Patient declined  No - Patient declined   Copy of Healthcare Power of Attorney in Chart?   No - copy requested  No - copy requested  No - copy  requested    Current Medications (verified) Outpatient Encounter Medications as of 09/05/2024  Medication Sig   amitriptyline  (ELAVIL ) 50 MG tablet Take 1-2 tablets (50-100 mg total) by mouth 2 (two) times daily as needed (Depression).   cholecalciferol (VITAMIN D) 25 MCG (1000 UNIT) tablet Take 1,000 Units by mouth daily.   clonazePAM  (KLONOPIN ) 0.5 MG tablet Take 1 tablet (0.5 mg total) by mouth daily as needed for anxiety.   Melatonin 10 MG TABS Take 10 mg by mouth at bedtime.   metoprolol  succinate (TOPROL -XL) 50 MG 24 hr tablet TAKE 1 TABLET BY MOUTH TWICE  DAILY WITH OR IMMEDIATELY  FOLLOWING A MEAL   nitroGLYCERIN  (NITROSTAT ) 0.4 MG SL tablet Place 1 tablet (0.4 mg total) under the tongue every 5 (five) minutes as needed for chest pain.   rosuvastatin  (CRESTOR ) 20 MG tablet Take 1 tablet (20 mg total) by mouth daily.   warfarin (COUMADIN ) 5 MG tablet TAKE 1 TABLET BY MOUTH DAILY AS  DIRECTED BY COUMADIN  CLINIC   No facility-administered encounter medications on file as of 09/05/2024.    Allergies (verified) Amlodipine , Atorvastatin , Oxycodone, and Latex   History: Past Medical History:  Diagnosis Date   A-fib [facility])    Allergy    Latex   Anxiety 2006   Cancer [facility]) 2012   Prostate   Cataract 2017   Surgery both eyes   Depression 2006   DVT (deep venous thrombosis) (HCC)    Encounter for monitoring warfarin therapy 07/22/2019   History  of colon polyps    1 adenoma: 6- 2018   Hypertension    Kidney stones 01/08/2020   Mixed hyperlipidemia 08/09/2021   Peripheral neuropathy 08/02/2012   Primary osteoarthritis of left hip 11/23/2021   Pulmonary embolism (HCC)    Pulmonary emphysema (HCC) 11/07/2022   Pulmonary emphysema (HCC) 11/07/2022   Substance abuse (HCC)    Tobacco dependence 07/02/2019   Past Surgical History:  Procedure Laterality Date   ATRIAL FIBRILLATION ABLATION N/A 05/08/2023   Procedure: ATRIAL FIBRILLATION ABLATION;  Surgeon: Cindie Ole DASEN, MD;   Location: [facility] INVASIVE CV LAB;  Service: Cardiovascular;  Laterality: N/A;   CARDIOVERSION N/A 07/21/2022   Procedure: CARDIOVERSION;  Surgeon: Delford Maude BROCKS, MD;  Location: [facility] ENDOSCOPY;  Service: Cardiovascular;  Laterality: N/A;   CARDIOVERSION N/A 02/23/2023   Procedure: CARDIOVERSION;  Surgeon: Delford Maude BROCKS, MD;  Location: [facility] ENDOSCOPY;  Service: Cardiovascular;  Laterality: N/A;   CARDIOVERSION N/A 03/22/2024   Procedure: CARDIOVERSION;  Surgeon: Pietro Redell RAMAN, MD;  Location: [facility] INVASIVE CV LAB;  Service: Cardiovascular;  Laterality: N/A;   CORONARY ANGIOGRAPHY N/A 06/07/2024   Procedure: CORONARY ANGIOGRAPHY;  Surgeon: Wonda Sharper, MD;  Location: [facility] INVASIVE CV LAB;  Service: Cardiovascular;  Laterality: N/A;   EYE SURGERY  2017   Cataracts   PROSTATECTOMY     TEE WITHOUT CARDIOVERSION N/A 02/23/2023   Procedure: TRANSESOPHAGEAL ECHOCARDIOGRAM (TEE);  Surgeon: Delford Maude BROCKS, MD;  Location: [facility] ENDOSCOPY;  Service: Cardiovascular;  Laterality: N/A;   TONSILLECTOMY     TRANSESOPHAGEAL ECHOCARDIOGRAM (CATH LAB) N/A 03/22/2024   Procedure: TRANSESOPHAGEAL ECHOCARDIOGRAM;  Surgeon: Pietro Redell RAMAN, MD;  Location: [facility] INVASIVE CV LAB;  Service: Cardiovascular;  Laterality: N/A;   Family History  Problem Relation Age of Onset   Anxiety disorder Mother    Cancer Mother    Stroke Mother    Alcohol abuse Father    Early death Father    Anxiety disorder Brother    Depression Brother    Anxiety disorder Daughter    Depression Daughter    Anxiety disorder Brother    Depression Brother    Anxiety disorder Daughter    Depression Daughter    Esophageal cancer Neg Hx    Social History   Socioeconomic History   Marital status: Married    Spouse name: Therisa CHARLENA Sam   Number of children: 1   Years of education: 13   Highest education level: Some college, no degree  Occupational History   Occupation: Retired  Tobacco Use   Smoking status: Every Day    Current packs/day:  0.00    Average packs/day: 1.2 packs/day for 52.4 years (61.4 ttl pk-yrs)    Types: Cigarettes    Start date: 2020    Last attempt to quit: 05/16/2024    Years since quitting: 0.3   Smokeless tobacco: Never   Tobacco comments:    Quit 1990-2015  Vaping Use   Vaping status: Never Used  Substance and Sexual Activity   Alcohol use: Not Currently    Comment: No alcohol since 1995   Drug use: Never   Sexual activity: Not Currently    Birth control/protection: None  Other Topics Concern   Not on file  Social History Narrative   Lives with wife. He enjoys yardwork.   Social Drivers of Corporate investment banker Strain: Low Risk  (09/05/2024)   Overall Financial Resource Strain (CARDIA)    Difficulty of Paying Living Expenses: Not hard at all  Food  Insecurity: No Food Insecurity (09/05/2024)   Hunger Vital Sign    Worried About Running Out of Food in the Last Year: Never true    Ran Out of Food in the Last Year: Never true  Transportation Needs: No Transportation Needs (09/05/2024)   PRAPARE - Administrator, Civil Service (Medical): No    Lack of Transportation (Non-Medical): No  Physical Activity: Insufficiently Active (09/05/2024)   Exercise Vital Sign    Days of Exercise per Week: 3 days    Minutes of Exercise per Session: 30 min  Stress: No Stress Concern Present (09/05/2024)   [employer] of Occupational Health - Occupational Stress Questionnaire    Feeling of Stress: Not at all  Social Connections: Moderately Integrated (09/05/2024)   Social Connection and Isolation Panel    Frequency of Communication with Friends and Family: Twice a week    Frequency of Social Gatherings with Friends and Family: Once a week    Attends Religious Services: More than 4 times per year    Active Member of [employer] or Organizations: No    Attends Banker Meetings: Never    Marital Status: Married  Recent Concern: Social Connections - Moderately Isolated (08/13/2024)    Social Connection and Isolation Panel    Frequency of Communication with Friends and Family: Once a week    Frequency of Social Gatherings with Friends and Family: Once a week    Attends Religious Services: More than 4 times per year    Active Member of [employer] or Organizations: No    Attends Engineer, structural: Not on file    Marital Status: Married    Tobacco Counseling Ready to quit: Not Answered Counseling given: Not Answered Tobacco comments: Quit 1990-2015   Clinical Intake:  Pre-visit preparation completed: Yes  Pain : No/denies pain     BMI - recorded: 27.05 Nutritional Status: BMI 25 -29 Overweight Nutritional Risks: None Diabetes: No  How often do you need to have someone help you when you read instructions, pamphlets, or other written materials from your doctor or pharmacy?: 1 - Never What is the last grade level you completed in school?: 13  Interpreter Needed?: No      Activities of Daily Living    09/05/2024    8:59 AM 09/02/2024    9:08 AM  In your present state of health, do you have any difficulty performing the following activities:  Hearing? 0 0  Vision? 0 0  Difficulty concentrating or making decisions? 0 0  Walking or climbing stairs? 0 0  Dressing or bathing? 0 0  Doing errands, shopping? 0 0  Preparing Food and eating ? N N  Using the Toilet? N N  In the past six months, have you accidently leaked urine? N N  Do you have problems with loss of bowel control? N N  Managing your Medications? N N  Managing your Finances? N N  Housekeeping or managing your Housekeeping? N N    Patient Care Team: Willo Mini, NP as PCP - General (Nurse Practitioner) Cindie Ole DASEN, MD as PCP - Electrophysiology (Cardiology) Delford Maude BROCKS, MD as PCP - Cardiology (Cardiology)  Indicate any recent Medical Services you may have received from other than [facility] providers in the past year (date may be approximate).     Assessment:   This is a  routine wellness examination for [place].  Hearing/Vision screen No results found.   Goals Addressed  This Visit's Progress    Patient Stated       Patient states he would like to have better heart health.        Depression Screen    09/05/2024    9:05 AM 05/06/2024    9:29 AM 08/29/2023    8:27 AM 08/22/2023    9:25 AM 05/04/2023    8:14 AM 03/23/2023    8:33 AM 11/29/2022   10:51 AM  PHQ 2/9 Scores  PHQ - 2 Score 0 1 0 0 1 0 0  PHQ- 9 Score  3   2      Fall Risk    09/05/2024    9:06 AM 09/02/2024    9:08 AM 05/06/2024    9:29 AM 08/29/2023    8:27 AM 08/25/2023    1:49 PM  Fall Risk   Falls in the past year? 0 1 1 1 1   Number falls in past yr: 0 0 1 0 0  Injury with Fall? 0 0 0 0 0  Risk for fall due to : No Fall Risks  History of fall(s) History of fall(s)   Follow up Falls evaluation completed  Falls evaluation completed Falls evaluation completed;Education provided;Falls prevention discussed     MEDICARE RISK AT HOME: Medicare Risk at Home Any stairs in or around the home?: Yes If so, are there any without handrails?: No Home free of loose throw rugs in walkways, pet beds, electrical cords, etc?: Yes Adequate lighting in your home to reduce risk of falls?: Yes Life alert?: No Use of a cane, walker or w/c?: No Grab bars in the bathroom?: No Shower chair or bench in shower?: No Elevated toilet seat or a handicapped toilet?: Yes  TIMED UP AND GO:  Was the test performed?  No    Cognitive Function:        09/05/2024    9:06 AM 08/29/2023    8:30 AM 08/22/2022    9:07 AM  6CIT Screen  What Year? 0 points 0 points 0 points  What month? 0 points 0 points 0 points  What time? 0 points 0 points 0 points  Count back from 20 0 points 0 points 0 points  Months in reverse 0 points 0 points 0 points  Repeat phrase 0 points 2 points 2 points  Total Score 0 points 2 points 2 points    Immunizations Immunization History  Administered Date(s) Administered    Pneumococcal Conjugate-13 06/25/2015   Pneumococcal Polysaccharide-23 02/25/2014   Tdap 03/04/2014   Zoster Recombinant(Shingrix) 03/17/2018, 06/06/2018   Zoster, Live 10/31/2014    TDAP status: Due, Education has been provided regarding the importance of this vaccine. Advised may receive this vaccine at local pharmacy or Health Dept. Aware to provide a copy of the vaccination record if obtained from local pharmacy or Health Dept. Verbalized acceptance and understanding.  Flu Vaccine status: Declined, Education has been provided regarding the importance of this vaccine but patient still declined. Advised may receive this vaccine at local pharmacy or Health Dept. Aware to provide a copy of the vaccination record if obtained from local pharmacy or Health Dept. Verbalized acceptance and understanding.  Pneumococcal vaccine status: Up to date  Covid-19 vaccine status: Declined, Education has been provided regarding the importance of this vaccine but patient still declined. Advised may receive this vaccine at local pharmacy or Health Dept.or vaccine clinic. Aware to provide a copy of the vaccination record if obtained from local pharmacy or Health Dept. Verbalized acceptance  and understanding.  Qualifies for Shingles Vaccine? Yes   Zostavax completed Yes   Shingrix Completed?: Yes  Screening Tests Health Maintenance  Topic Date Due   Colonoscopy  06/06/2022   Influenza Vaccine  Never done   DTaP/Tdap/Td (2 - Td or Tdap) 05/06/2025 (Originally 03/04/2024)   Lung Cancer Screening  11/05/2024   Medicare Annual Wellness (AWV)  09/05/2025   Pneumococcal Vaccine: 50+ Years  Completed   Hepatitis C Screening  Completed   Zoster Vaccines- Shingrix  Completed   HPV VACCINES  Aged Out   Meningococcal B Vaccine  Aged Out   COVID-19 Vaccine  Discontinued    Health Maintenance  Health Maintenance Due  Topic Date Due   Colonoscopy  06/06/2022   Influenza Vaccine  Never done    Colorectal  cancer screening: Referral to GI placed 09/05/2024. Pt aware the office will call re: appt.  Lung Cancer Screening: (Low Dose CT Chest recommended if Age 6-80 years, 20 pack-year currently smoking OR have quit w/in 15years.) does qualify.   Lung Cancer Screening Referral: Patient already in program.   Additional Screening:  Hepatitis C Screening: does not qualify; Completed 03/03/2015  Vision Screening: Recommended annual ophthalmology exams for early detection of glaucoma and other disorders of the eye. Is the patient up to date with their annual eye exam?  Yes  Who is the provider or what is the name of the office in which the patient attends annual eye exams? [facility] If pt is not established with a provider, would they like to be referred to a provider to establish care? N/a.   Dental Screening: Recommended annual dental exams for proper oral hygiene   Community Resource Referral / Chronic Care Management: CRR required this visit?  No   CCM required this visit?  No     Plan:     I have personally reviewed and noted the following in the patient's chart:   Medical and social history Use of alcohol, tobacco or illicit drugs  Current medications and supplements including opioid prescriptions. Patient is not currently taking opioid prescriptions. Functional ability and status Nutritional status Physical activity Advanced directives List of other physicians Hospitalizations, surgeries, and ER visits in previous 12 months. None Vitals Screenings to include cognitive, depression, and falls Referrals and appointments  In addition, I have reviewed and discussed with patient certain preventive protocols, quality metrics, and best practice recommendations. A written personalized care plan for preventive services as well as general preventive health recommendations were provided to patient.     Bonny Jon Mayor, CMA   09/05/2024   After Visit Summary: (MyChart) Due to this  being a telephonic visit, the after visit summary with patients personalized plan was offered to patient via MyChart   Nurse Notes:   TAHJIR SILVERIA is a 78 y.o. male patient of Willo Mini, NP who had a Medicare Annual Wellness Visit today via telephone. [place] is Retired and lives with their spouse. He has 1 child. He reports that he is socially active and does interact with friends/family regularly. He is minimally physically active and enjoys yard work.

## 2024-09-12 ENCOUNTER — Ambulatory Visit: Attending: Cardiovascular Disease

## 2024-09-12 DIAGNOSIS — I4892 Unspecified atrial flutter: Secondary | ICD-10-CM

## 2024-09-12 DIAGNOSIS — Z5181 Encounter for therapeutic drug level monitoring: Secondary | ICD-10-CM

## 2024-09-12 LAB — POCT INR: INR: 3.5 — AB (ref 2.0–3.0)

## 2024-09-12 NOTE — Patient Instructions (Signed)
 Decrease to 5 mg daily except for 7.5 mg on Mondays.  Recheck INR in 3 weeks.  Anticoagulation Clinic [phone]

## 2024-09-12 NOTE — Progress Notes (Signed)
 INR 3.5 Please see anticoagulation encounter Decrease to 5 mg daily except for 7.5 mg on Mondays.  Recheck INR in 3 weeks.  Anticoagulation Clinic [phone]

## 2024-09-22 ENCOUNTER — Encounter: Payer: Self-pay | Admitting: Medical-Surgical

## 2024-09-24 NOTE — Progress Notes (Signed)
 Cardiology Clinic Note   Patient Name: Larry Odonnell Date of Encounter: 10/07/2024  Primary Care Provider:  Willo Mini, NP Primary Cardiologist:  Maude Emmer, MD  Patient Profile    Larry Odonnell 78 year old male presents to the clinic today for follow-up. History of  CAD, PAF, HTN, DVT, HLD, smoking and emphysema  Past Medical History    Past Medical History:  Diagnosis Date   A-fib [facility])    Allergy    Latex   Anxiety 2006   Cancer [facility]) 2012   Prostate   Cataract 2017   Surgery both eyes   Depression 2006   DVT (deep venous thrombosis) (HCC)    Encounter for monitoring warfarin therapy 07/22/2019   History of colon polyps    1 adenoma: 6- 2018   Hypertension    Kidney stones 01/08/2020   Mixed hyperlipidemia 08/09/2021   Peripheral neuropathy 08/02/2012   Primary osteoarthritis of left hip 11/23/2021   Pulmonary embolism (HCC)    Pulmonary emphysema (HCC) 11/07/2022   Pulmonary emphysema (HCC) 11/07/2022   Substance abuse (HCC)    Tobacco dependence 07/02/2019   Past Surgical History:  Procedure Laterality Date   ATRIAL FIBRILLATION ABLATION N/A 05/08/2023   Procedure: ATRIAL FIBRILLATION ABLATION;  Surgeon: Cindie Ole DASEN, MD;  Location: [facility] INVASIVE CV LAB;  Service: Cardiovascular;  Laterality: N/A;   CARDIOVERSION N/A 07/21/2022   Procedure: CARDIOVERSION;  Surgeon: Emmer Maude BROCKS, MD;  Location: [facility] ENDOSCOPY;  Service: Cardiovascular;  Laterality: N/A;   CARDIOVERSION N/A 02/23/2023   Procedure: CARDIOVERSION;  Surgeon: Emmer Maude BROCKS, MD;  Location: [facility] ENDOSCOPY;  Service: Cardiovascular;  Laterality: N/A;   CARDIOVERSION N/A 03/22/2024   Procedure: CARDIOVERSION;  Surgeon: Pietro Redell RAMAN, MD;  Location: [facility] INVASIVE CV LAB;  Service: Cardiovascular;  Laterality: N/A;   CORONARY ANGIOGRAPHY N/A 06/07/2024   Procedure: CORONARY ANGIOGRAPHY;  Surgeon: Wonda Sharper, MD;  Location: [facility] INVASIVE CV LAB;  Service: Cardiovascular;  Laterality:  N/A;   EYE SURGERY  2017   Cataracts   PROSTATECTOMY     TEE WITHOUT CARDIOVERSION N/A 02/23/2023   Procedure: TRANSESOPHAGEAL ECHOCARDIOGRAM (TEE);  Surgeon: Emmer Maude BROCKS, MD;  Location: [facility] ENDOSCOPY;  Service: Cardiovascular;  Laterality: N/A;   TONSILLECTOMY     TRANSESOPHAGEAL ECHOCARDIOGRAM (CATH LAB) N/A 03/22/2024   Procedure: TRANSESOPHAGEAL ECHOCARDIOGRAM;  Surgeon: Pietro Redell RAMAN, MD;  Location: [facility] INVASIVE CV LAB;  Service: Cardiovascular;  Laterality: N/A;    Allergies  Allergies  Allergen Reactions   Amlodipine  Other (See Comments)    Caused the patient to fall   Atorvastatin  Other (See Comments)    Pt reports causes leg pains and leg heaviness   Oxycodone Other (See Comments)    Causes extreme dizziness and falling down   Latex Rash    History of Present Illness    Larry Odonnell has a PMH of atrial fibrillation status post ablation 5/24, HTN, HLD, tobacco dependence, coronary artery disease (coronary calcium  score 806 on 05/04/2023 via CTA), aortic atherosclerosis, and pulmonary embolus.  He underwent ablation 5/24. He did not tolerate amiodarone  due to brain fog dizziness and fatigue. MRI of his head 9/24 was negative.  He smokes half pack per day.  On his CT he was noted to have aortic atherosclerosis and three-vessel coronary disease.  Carotid duplex 9/24 showed some plaque and no stenosis.  TEE 02/23/2023 showed an EF of 60-65% and mild MR.  He was seen in follow-up 02/13/2024 by me  He was  maintaining sinus rhythm in the 80s.  He was advised to undergo cardiac PET/CT for evaluation of ischemia due to his elevated calcium  score.  His blood pressure was elevated.  He was advised to continue metoprolol  and lisinopril . Scan not done till 04/30/24 It showed inferior wall ischemia with normal EF see below   He underwent successful DCCV on 03/22/2024.  He received 1 shock with 200 J and converted to sinus rhythm.  He was continued on Coumadin .  He tolerated the procedure  well.  He followed up with Dr. Cindie for 04/04/24.  He was doing well at that time.  He denied recurrent arrhythmia.  Plan for continue to monitor heart rhythms.  In the occurrence of repeat arrhythmia it was felt that he may need repeat catheter ablation due to his prior intolerance of amiodarone   He underwent cardiac catheterization on 06/07/2024.  He was noted to have total occlusion of his mid RCA with left-to-right collateral.  Medical management was recommended.  Today he denies chest pain, shortness of breath, lower extremity edema, palpitations, melena, hematuria, hemoptysis, diaphoresis, weakness, presyncope, syncope, orthopnea, and PND.  Occasional funny feeling in head when walking Not really postural Has some GERD   Has had recurrent DVT;s dating back to 2008. Does not mind being on coumadin       Home Medications    Prior to Admission medications   Medication Sig Start Date End Date Taking? Authorizing Provider  amitriptyline  (ELAVIL ) 50 MG tablet Take 1-2 tablets (50-100 mg total) by mouth 2 (two) times daily as needed (Depression). 05/06/24   Willo Mini, NP  cholecalciferol (VITAMIN D) 25 MCG (1000 UNIT) tablet Take 1,000 Units by mouth daily.    [provider]  clonazePAM  (KLONOPIN ) 0.5 MG tablet Take 1 tablet (0.5 mg total) by mouth daily as needed for anxiety. 02/27/24   Jessup, Joy, NP  Melatonin 10 MG TABS Take 10 mg by mouth at bedtime as needed (Sleep).    [provider]  metoprolol  succinate (TOPROL -XL) 50 MG 24 hr tablet Take 1 tablet (50 mg total) by mouth 2 (two) times daily. Take with or immediately following a meal. 03/11/24   Swinyer, Rosaline HERO, NP  nitroGLYCERIN  (NITROSTAT ) 0.4 MG SL tablet Place 1 tablet (0.4 mg total) under the tongue every 5 (five) minutes as needed for chest pain. 05/03/24   Delford Maude BROCKS, MD  rosuvastatin  (CRESTOR ) 10 MG tablet Take 1 tablet (10 mg total) by mouth daily. 02/28/24   Jazalyn Mondor C, MD  warfarin (COUMADIN ) 5  MG tablet TAKE 1 TABLET BY MOUTH  DAILY AS DIRECTED BY COUMADIN   CLINIC Patient taking differently: Take 5 mg by mouth See admin instructions. Take 5 mg on Tues, Thurs., Sat. and Sun., In the morning DIRECTED BY COUMADIN   CLINIC 01/18/24   Cindie Ole DASEN, MD    Family History    Family History  Problem Relation Age of Onset   Anxiety disorder Mother    Cancer Mother    Stroke Mother    Alcohol abuse Father    Early death Father    Anxiety disorder Brother    Depression Brother    Anxiety disorder Daughter    Depression Daughter    Anxiety disorder Brother    Depression Brother    Anxiety disorder Daughter    Depression Daughter    Esophageal cancer Neg Hx    He indicated that the status of his mother is unknown. He indicated that his father is alive. He  indicated that only one of his two brothers is alive. He indicated that only one of his two daughters is alive. He indicated that the status of his neg hx is unknown.  Social History    Social History   Socioeconomic History   Marital status: Married    Spouse name: Therisa CHARLENA Sam   Number of children: 1   Years of education: 13   Highest education level: Some college, no degree  Occupational History   Occupation: Retired  Tobacco Use   Smoking status: Every Day    Current packs/day: 0.00    Average packs/day: 1.2 packs/day for 52.4 years (61.4 ttl pk-yrs)    Types: Cigarettes    Start date: 2020    Last attempt to quit: 05/16/2024    Years since quitting: 0.3   Smokeless tobacco: Never   Tobacco comments:    Quit 1990-2015  Vaping Use   Vaping status: Never Used  Substance and Sexual Activity   Alcohol use: Not Currently    Comment: No alcohol since 1995   Drug use: Never   Sexual activity: Not Currently    Birth control/protection: None  Other Topics Concern   Not on file  Social History Narrative   Lives with wife. He enjoys yardwork.   Social Drivers of Corporate investment banker Strain: Low  Risk  (09/05/2024)   Overall Financial Resource Strain (CARDIA)    Difficulty of Paying Living Expenses: Not hard at all  Food Insecurity: No Food Insecurity (09/05/2024)   Hunger Vital Sign    Worried About Running Out of Food in the Last Year: Never true    Ran Out of Food in the Last Year: Never true  Transportation Needs: No Transportation Needs (09/05/2024)   PRAPARE - Administrator, Civil Service (Medical): No    Lack of Transportation (Non-Medical): No  Physical Activity: Insufficiently Active (09/05/2024)   Exercise Vital Sign    Days of Exercise per Week: 3 days    Minutes of Exercise per Session: 30 min  Stress: No Stress Concern Present (09/05/2024)   [employer] of Occupational Health - Occupational Stress Questionnaire    Feeling of Stress: Not at all  Social Connections: Moderately Integrated (09/05/2024)   Social Connection and Isolation Panel    Frequency of Communication with Friends and Family: Twice a week    Frequency of Social Gatherings with Friends and Family: Once a week    Attends Religious Services: More than 4 times per year    Active Member of [employer] or Organizations: No    Attends Banker Meetings: Never    Marital Status: Married  Recent Concern: Social Connections - Moderately Isolated (08/13/2024)   Social Connection and Isolation Panel    Frequency of Communication with Friends and Family: Once a week    Frequency of Social Gatherings with Friends and Family: Once a week    Attends Religious Services: More than 4 times per year    Active Member of [employer] or Organizations: No    Attends Engineer, structural: Not on file    Marital Status: Married  Catering manager Violence: Not At Risk (09/05/2024)   Humiliation, Afraid, Rape, and Kick questionnaire    Fear of Current or Ex-Partner: No    Emotionally Abused: No    Physically Abused: No    Sexually Abused: No     Review of Systems    General:  No chills, fever,  night sweats  or weight changes.  Cardiovascular:  No chest pain, dyspnea on exertion, edema, orthopnea, palpitations, paroxysmal nocturnal dyspnea. Dermatological: No rash, lesions/masses Respiratory: No cough, dyspnea Urologic: No hematuria, dysuria Abdominal:   No nausea, vomiting, diarrhea, bright red blood per rectum, melena, or hematemesis Neurologic:  No visual changes, wkns, changes in mental status. All other systems reviewed and are otherwise negative except as noted above.  Physical Exam    VS:  BP 120/74 (BP Location: Right Arm, Patient Position: Sitting, Cuff Size: Normal)   Pulse 60   Ht 6' 1 (1.854 m)   Wt 205 lb 6.4 oz (93.2 kg)   SpO2 99%   BMI 27.10 kg/m  , BMI Body mass index is 27.1 kg/m.  Affect appropriate Healthy:  appears stated age HEENT: normal Neck supple with no adenopathy JVP normal no bruits no thyromegaly Lungs clear with no wheezing and good diaphragmatic motion Heart:  S1/S2 no murmur, no rub, gallop or click PMI normal Abdomen: benighn, BS positve, no tenderness, no AAA no bruit.  No HSM or HJR Distal pulses intact with no bruits No edema Neuro non-focal Skin warm and dry No muscular weakness   Accessory Clinical Findings    Recent Labs: 05/21/2024: BUN 17; Creatinine, Ser 1.14; Hemoglobin 14.6; Platelets 245; Potassium 5.2; Sodium 140 10/02/2024: ALT 17   Recent Lipid Panel    Component Value Date/Time   CHOL 149 10/02/2024 0901   TRIG 116 10/02/2024 0901   HDL 43 10/02/2024 0901   CHOLHDL 3.5 10/02/2024 0901   LDLCALC 85 10/02/2024 0901         ECG personally reviewed by me today-  none today.     Echocardiogram 03/22/2024  IMPRESSIONS     1. Left ventricular ejection fraction, by estimation, is 55 to 60%. The  left ventricle has normal function. The left ventricle has no regional  wall motion abnormalities.   2. Right ventricular systolic function is normal. The right ventricular  size is normal.   3. Left atrial  size was mildly dilated. No left atrial/left atrial  appendage thrombus was detected.   4. The mitral valve is normal in structure. Mild mitral valve  regurgitation.   5. The aortic valve is tricuspid. Aortic valve regurgitation is not  visualized.   6. There is mild (Grade II) plaque involving the descending aorta.   Conclusion(s)/Recommendation(s): No LA/LAA thrombus identified. Successful  cardioversion performed with restoration of normal sinus rhythm.   FINDINGS   Left Ventricle: Left ventricular ejection fraction, by estimation, is 55  to 60%. The left ventricle has normal function. The left ventricle has no  regional wall motion abnormalities. The left ventricular internal cavity  size was normal in size.   Right Ventricle: The right ventricular size is normal. Right ventricular  systolic function is normal.   Left Atrium: Left atrial size was mildly dilated. No left atrial/left  atrial appendage thrombus was detected.   Right Atrium: Right atrial size was normal in size.   Pericardium: There is no evidence of pericardial effusion.   Mitral Valve: The mitral valve is normal in structure. Mild mitral valve  regurgitation.   Tricuspid Valve: The tricuspid valve is normal in structure. Tricuspid  valve regurgitation is mild.   Aortic Valve: The aortic valve is tricuspid. Aortic valve regurgitation is  not visualized.   Pulmonic Valve: The pulmonic valve was normal in structure. Pulmonic valve  regurgitation is not visualized.   Aorta: The aortic root is normal in size and structure.  There is mild  (Grade II) plaque involving the descending aorta.   IAS/Shunts: No atrial level shunt detected by color flow Doppler.      LHC 06/07/2024    Mid RCA lesion is 100% stenosed.   Mid Cx lesion is 50% stenosed.   1st Diag lesion is 60% stenosed.   Total occlusion of the mid-RCA with left-to-right collateral. This is a small, co-dominant vessel Patent left main with no  stenosis Patent LAD with mild diffuse plaquing and no focal high-grade stenoses.  Moderate stenosis of a large diagonal (50-60%) Moderate 50% stenosis of the mid circumflex   Patient with a chronic occlusion of a small codominant RCA and mild to moderate diffuse disease elsewhere.  Recommend aggressive medical therapy with lipid-lowering goal LDL less than 55 mg/dL.  The patient anticoagulated with warfarin and can start back on this tonight.  No indication for PCI or CABG.  Diagnostic Dominance: Co-dominant  Intervention   Assessment & Plan   1.  Coronary artery disease-denies chest pain and anginal type symptoms.  Cath 06/07/24 with CTO RCA with collaterals and moderate LCX/D1 dx. Medical Rx Not on ASA due to age and need for coumadin   Continue metoprolol , rosuvastatin   Hyperlipidemia-LDL 110 06/27/24   High-fiber diet Increase crestor  to 40 mg  Repeat fasting lipids and LFTs 3 months   Atrial fibrillation-Hr today 80 bpm.  Compliant with Coumadin .  Denies bleeding issues. Continue metoprolol , Coumadin  Avoid triggers caffeine, chocolate, EtOH, dehydration etc. Following with EP  Essential hypertension -BP today perfect  Maintain blood pressure log Heart healthy low-sodium diet Continue metoprolol   GERD: Suggested Pepcid complete and low carb diet  Increase Crestor  to 40 mg daily  F/U lipid/liver in 3 months   Disposition: Follow-up in 6 months with Dr Cindie EP and me in a year  Maude Emmer MD [facility]

## 2024-10-03 ENCOUNTER — Ambulatory Visit: Attending: Cardiovascular Disease | Admitting: *Deleted

## 2024-10-03 ENCOUNTER — Encounter: Payer: Self-pay | Admitting: Medical-Surgical

## 2024-10-03 ENCOUNTER — Ambulatory Visit: Payer: Self-pay | Admitting: Cardiovascular Disease

## 2024-10-03 DIAGNOSIS — I4892 Unspecified atrial flutter: Secondary | ICD-10-CM | POA: Diagnosis not present

## 2024-10-03 DIAGNOSIS — F418 Other specified anxiety disorders: Secondary | ICD-10-CM

## 2024-10-03 DIAGNOSIS — Z5181 Encounter for therapeutic drug level monitoring: Secondary | ICD-10-CM

## 2024-10-03 DIAGNOSIS — E785 Hyperlipidemia, unspecified: Secondary | ICD-10-CM

## 2024-10-03 LAB — HEPATIC FUNCTION PANEL
ALT: 17 IU/L (ref 0–44)
AST: 18 IU/L (ref 0–40)
Albumin: 4 g/dL (ref 3.8–4.8)
Alkaline Phosphatase: 63 IU/L (ref 47–123)
Bilirubin Total: 0.3 mg/dL (ref 0.0–1.2)
Bilirubin, Direct: 0.11 mg/dL (ref 0.00–0.40)
Total Protein: 6.8 g/dL (ref 6.0–8.5)

## 2024-10-03 LAB — LIPID PANEL
Chol/HDL Ratio: 3.5 ratio (ref 0.0–5.0)
Cholesterol, Total: 149 mg/dL (ref 100–199)
HDL: 43 mg/dL (ref >39)
LDL Chol Calc (NIH): 85 mg/dL (ref 0–99)
Triglycerides: 116 mg/dL (ref 0–149)
VLDL Cholesterol Cal: 21 mg/dL (ref 5–40)

## 2024-10-03 LAB — POCT INR: POC INR: 2.5

## 2024-10-03 MED ORDER — CLONAZEPAM 0.5 MG PO TABS: 0.5000 mg | ORAL_TABLET | Freq: Every day | ORAL | 0 refills | Status: DC | PRN

## 2024-10-03 MED ORDER — ROSUVASTATIN CALCIUM 40 MG PO TABS: 40.0000 mg | ORAL_TABLET | Freq: Every day | ORAL | 3 refills | Status: AC

## 2024-10-03 NOTE — Progress Notes (Signed)
 Lab Results  Component Value Date   INR 2.5 10/03/2024   INR 3.5 (A) 09/12/2024   INR 3.5 (A) 08/22/2024    Description   INR 2.5, Continue to take warfarin 5 mg daily except for 7.5 mg on Mondays.  Recheck INR in 4 weeks.  Anticoagulation Clinic [phone]

## 2024-10-03 NOTE — Telephone Encounter (Signed)
 Patient requesting rx rf of clonazepam  0.5mg   Last written 03/04/225 Last OV 05/06/2024 with Zada Palin, NP  08/16/2024 with Dr. Curtis Upcoming appt 11/12/2024

## 2024-10-03 NOTE — Patient Instructions (Signed)
 Description   INR 2.5, Continue to take warfarin 5 mg daily except for 7.5 mg on Mondays.  Recheck INR in 4 weeks.  Anticoagulation Clinic [phone]

## 2024-10-07 ENCOUNTER — Ambulatory Visit: Attending: Cardiovascular Disease | Admitting: Cardiovascular Disease

## 2024-10-07 ENCOUNTER — Encounter: Payer: Self-pay | Admitting: Cardiovascular Disease

## 2024-10-07 VITALS — BP 120/74 | HR 60 | Ht 73.0 in | Wt 205.4 lb

## 2024-10-07 DIAGNOSIS — I10 Essential (primary) hypertension: Secondary | ICD-10-CM | POA: Diagnosis not present

## 2024-10-07 DIAGNOSIS — D6869 Other thrombophilia: Secondary | ICD-10-CM | POA: Diagnosis not present

## 2024-10-07 DIAGNOSIS — E785 Hyperlipidemia, unspecified: Secondary | ICD-10-CM | POA: Diagnosis not present

## 2024-10-07 DIAGNOSIS — Z8679 Personal history of other diseases of the circulatory system: Secondary | ICD-10-CM

## 2024-10-07 DIAGNOSIS — Z9889 Other specified postprocedural states: Secondary | ICD-10-CM | POA: Diagnosis not present

## 2024-10-07 DIAGNOSIS — I4819 Other persistent atrial fibrillation: Secondary | ICD-10-CM

## 2024-10-07 NOTE — Patient Instructions (Signed)
 Medication Instructions:  Your physician recommends that you continue on your current medications as directed. Please refer to the Current Medication list given to you today.  *If you need a refill on your cardiac medications before your next appointment, please call your pharmacy*  Lab Work: Lipid panel, live panel 3 months If you have labs (blood work) drawn today and your tests are completely normal, you will receive your results only by: MyChart Message (if you have MyChart) OR A paper copy in the mail If you have any lab test that is abnormal or we need to change your treatment, we will call you to review the results.  Testing/Procedures: none  Follow-Up: At [facility], you and your health needs are our priority.  As part of our continuing mission to provide you with exceptional heart care, our providers are all part of one team.  This team includes your primary Cardiologist (physician) and Advanced Practice Providers or APPs (Physician Assistants and Nurse Practitioners) who all work together to provide you with the care you need, when you need it.  Your next appointment:   3 month(s)  Provider:   Maude Emmer, MD    We recommend signing up for the patient portal called MyChart.  Sign up information is provided on this After Visit Summary.  MyChart is used to connect with patients for Virtual Visits (Telemedicine).  Patients are able to view lab/test results, encounter notes, upcoming appointments, etc.  Non-urgent messages can be sent to your provider as well.   To learn more about what you can do with MyChart, go to [email or web address].   Other Instructions none

## 2024-10-31 ENCOUNTER — Ambulatory Visit: Attending: Cardiovascular Disease

## 2024-10-31 DIAGNOSIS — Z5181 Encounter for therapeutic drug level monitoring: Secondary | ICD-10-CM

## 2024-10-31 DIAGNOSIS — I4892 Unspecified atrial flutter: Secondary | ICD-10-CM | POA: Diagnosis not present

## 2024-10-31 LAB — POCT INR: INR: 2.4 (ref 2.0–3.0)

## 2024-10-31 NOTE — Patient Instructions (Signed)
 Continue to take warfarin 5 mg daily except for 7.5 mg on Mondays.  Recheck INR in 6 weeks.  Anticoagulation Clinic [phone]

## 2024-10-31 NOTE — Progress Notes (Signed)
 INR 2.4 Please see anticoagulation encounter Continue to take warfarin 5 mg daily except for 7.5 mg on Mondays.  Recheck INR in 6 weeks.  Anticoagulation Clinic [phone]

## 2024-11-06 ENCOUNTER — Ambulatory Visit: Admitting: Medical-Surgical

## 2024-11-06 ENCOUNTER — Ambulatory Visit: Payer: Medicare Other

## 2024-11-11 ENCOUNTER — Ambulatory Visit

## 2024-11-11 DIAGNOSIS — F17210 Nicotine dependence, cigarettes, uncomplicated: Secondary | ICD-10-CM

## 2024-11-11 DIAGNOSIS — Z87891 Personal history of nicotine dependence: Secondary | ICD-10-CM

## 2024-11-11 DIAGNOSIS — Z122 Encounter for screening for malignant neoplasm of respiratory organs: Secondary | ICD-10-CM | POA: Diagnosis not present

## 2024-11-12 ENCOUNTER — Ambulatory Visit: Admitting: Medical-Surgical

## 2024-11-12 ENCOUNTER — Encounter: Payer: Self-pay | Admitting: Medical-Surgical

## 2024-11-12 VITALS — BP 123/73 | HR 67 | Resp 20 | Ht 73.0 in | Wt 204.0 lb

## 2024-11-12 DIAGNOSIS — I4819 Other persistent atrial fibrillation: Secondary | ICD-10-CM | POA: Diagnosis not present

## 2024-11-12 DIAGNOSIS — F418 Other specified anxiety disorders: Secondary | ICD-10-CM

## 2024-11-12 DIAGNOSIS — I10 Essential (primary) hypertension: Secondary | ICD-10-CM

## 2024-11-12 DIAGNOSIS — E782 Mixed hyperlipidemia: Secondary | ICD-10-CM

## 2024-11-12 DIAGNOSIS — M1612 Unilateral primary osteoarthritis, left hip: Secondary | ICD-10-CM

## 2024-11-12 MED ORDER — CLONAZEPAM 0.5 MG PO TABS: 0.5000 mg | ORAL_TABLET | Freq: Every day | ORAL | 5 refills | Status: AC | PRN

## 2024-11-12 NOTE — Progress Notes (Signed)
 Established Patient Office Visit  Subjective   Patient ID: SESAR MADEWELL, male    DOB: 1946/01/24  Age: 78 y.o. MRN: [id number]  Chief Complaint  Patient presents with   Medical Management of Chronic Issues    HPI  78 year old male presents for a 6 month follow up on the following chronic conditions.  Essential hypertension. Patient is currently taking metoprolol  succinate 50 mg daily. Patient is currently followed closely by cardiology. He checks his blood pressures at home about once per week and states BP is around 120s/70s. He has nitroglycerin  needed for chest pain, but has not had to use. Reports occasional episodes of dizziness that lasts for about a minute. He states that sitting down resolves symptoms. Has discussed with Cardiology, but states that cardiologist did not think that it was related to his AFIB. Next follow up with Cardiology is in January.  AFIB Patient is currently taking warfarin 5 mg daily. Denies having heart palpitations.He is currently being followed by Cardiology with his next follow up being in January.  Hyperlipidemia  Currently taking Crestor  40 mg daily. With no side effects. Last Lipid Panel 10/02/2024  Anxiety and Depression Patient is currently taking Elavil  50 mg daily and clonazepam  0.5 mg as needed. Reports doing well on current dosage. Current PHQ 9 score is 6 and GAD 7 is 0. Denies SI and HI  Osteoarthritis Patient would like a referral to sports medicine to continue getting injection into his left hip. Last injection was in 07/2024 by Dr. ONEIDA. States that injection has helped manage his pain well. Would like to discuss referral to sports medicine or ortho.   Review of Systems  Constitutional: Negative.   HENT: Negative.    Eyes: Negative.   Respiratory: Negative.    Cardiovascular: Negative.   Gastrointestinal: Negative.   Genitourinary: Negative.   Musculoskeletal: Negative.   Skin: Negative.   Neurological:  Positive for dizziness.        Occasional episodes of dizziness  Endo/Heme/Allergies: Negative.   Psychiatric/Behavioral: Negative.        Objective:     BP 123/73 (BP Location: Right Arm, Cuff Size: Normal)   Pulse 67   Resp 20   Ht 6' 1 (1.854 m)   Wt 92.5 kg   SpO2 99%   BMI 26.91 kg/m  BP Readings from Last 3 Encounters:  11/12/24 123/73  10/07/24 120/74  06/27/24 126/80      Physical Exam Vitals and nursing note reviewed.  Constitutional:      General: He is not in acute distress.    Appearance: Normal appearance. He is normal weight.  Cardiovascular:     Rate and Rhythm: Normal rate and regular rhythm.     Pulses: Normal pulses.     Heart sounds: Normal heart sounds.  Pulmonary:     Effort: Pulmonary effort is normal.     Breath sounds: Normal breath sounds.  Neurological:     General: No focal deficit present.     Mental Status: He is alert and oriented to person, place, and time.  Psychiatric:        Mood and Affect: Mood normal.        Behavior: Behavior normal.        Thought Content: Thought content normal.        Judgment: Judgment normal.      No results found for any visits on 11/12/24.  Last CBC Lab Results  Component Value Date   WBC  8.5 05/21/2024   HGB 14.6 05/21/2024   HCT 44.4 05/21/2024   MCV 95 05/21/2024   MCH 31.1 05/21/2024   RDW 12.9 05/21/2024   PLT 245 05/21/2024   Last metabolic panel Lab Results  Component Value Date   GLUCOSE 95 05/21/2024   NA 140 05/21/2024   K 5.2 05/21/2024   CL 104 05/21/2024   CO2 21 05/21/2024   BUN 17 05/21/2024   CREATININE 1.14 05/21/2024   EGFR 66 05/21/2024   CALCIUM  9.5 05/21/2024   PROT 6.8 10/02/2024   ALBUMIN 4.0 10/02/2024   LABGLOB 2.7 08/22/2023   AGRATIO 2.0 02/20/2023   BILITOT 0.3 10/02/2024   ALKPHOS 63 10/02/2024   AST 18 10/02/2024   ALT 17 10/02/2024   ANIONGAP 10 08/26/2023   Last lipids Lab Results  Component Value Date   CHOL 149 10/02/2024   HDL 43 10/02/2024   LDLCALC 85  10/02/2024   TRIG 116 10/02/2024   CHOLHDL 3.5 10/02/2024    The 89-bzjm ASCVD risk score (Arnett DK, et al., 2019) is: 30%    Assessment & Plan:   1. Primary hypertension (Primary) -Continue taking metoprolol  succinate 50 mg daily.  - Closely by Cardiology, follow up in January  2. Mixed hyperlipidemia -Continue with Crestor  40 mg daily -Followed by Cardiology  3. Persistent atrial fibrillation (HCC) -Continue taking warfarin 0.5 mg daily -Last INR 11/6 result 2.4  4. Anxiety with depression -Continue with Elavil  50 mg daily -Continue with clonazepam  0.5 mg as needed  5. Primary osteoarthritis of left hip -Previous hip injection 07/2024 -Referral to Ortho   Return in about 6 months (around 05/12/2025) for chronic disease follow up.    Derrek JINNY Payer, NP Student

## 2024-11-12 NOTE — Progress Notes (Signed)
 Medical screening examination/treatment was performed by qualified clinical staff member and as supervising provider I was immediately available for consultation/collaboration. I have reviewed documentation and agree with assessment and plan.  Thayer Ohm, DNP, APRN, FNP-BC [facility]

## 2024-11-13 ENCOUNTER — Encounter: Payer: Self-pay | Admitting: Gastroenterology

## 2024-11-13 ENCOUNTER — Telehealth: Payer: Self-pay

## 2024-11-13 ENCOUNTER — Ambulatory Visit: Admitting: Gastroenterology

## 2024-11-13 VITALS — BP 120/70 | HR 74 | Ht 71.0 in | Wt 206.5 lb

## 2024-11-13 DIAGNOSIS — R12 Heartburn: Secondary | ICD-10-CM | POA: Diagnosis not present

## 2024-11-13 DIAGNOSIS — Z860100 Personal history of colon polyps, unspecified: Secondary | ICD-10-CM

## 2024-11-13 MED ORDER — NA SULFATE-K SULFATE-MG SULF 17.5-3.13-1.6 GM/177ML PO SOLN: 1.0000 | Freq: Once | ORAL | 0 refills | Status: AC

## 2024-11-13 NOTE — Telephone Encounter (Signed)
 [facility] Pre-operative Risk Assessment     Request for surgical clearance:     Endoscopy Procedure  What type of surgery is being performed?     endoscopic  When is this surgery scheduled?     TBD  What type of clearance is required ?   Pharmacy  Are there any medications that need to be held prior to surgery and how long? Coumadin     Practice name and name of physician performing surgery?      [facility]  What is your office phone and fax number?      Phone- [phone]  Fax- [phone]  Anesthesia type (None, local, MAC, general) ?       MAC   Please route your response to [name] Louder, RMA

## 2024-11-13 NOTE — Telephone Encounter (Signed)
   Patient Name: Larry Odonnell  DOB: 1946-04-16 MRN: [id number]  Primary Cardiologist: Maude Emmer, MD  Chart reviewed as part of pre-operative protocol coverage. Given past medical history and time since last visit, based on ACC/AHA guidelines, Larry Odonnell is at acceptable risk for the planned procedure without further cardiovascular testing.   Ok for endocscopy can hold coumadin  for 3 days before procedure no lovenox needed   The patient was advised that if he develops new symptoms prior to surgery to contact our office to arrange for a follow-up visit, and he verbalized understanding.  I will route this recommendation to the requesting party via Epic fax function and remove from pre-op pool.  Please call with questions.  Lamarr Satterfield, NP 11/13/2024, 2:54 PM

## 2024-11-13 NOTE — Patient Instructions (Addendum)
 We have sent the following medications to your pharmacy for you to pick up at your convenience: SUPREP  You have been scheduled for a colonoscopy. Please follow written instructions given to you at your visit today.   If you use inhalers (even only as needed), please bring them with you on the day of your procedure.  DO NOT TAKE 7 DAYS PRIOR TO TEST- Trulicity (dulaglutide) Ozempic, Wegovy (semaglutide) Mounjaro, Zepbound (tirzepatide) Bydureon Bcise (exanatide extended release)  DO NOT TAKE 1 DAY PRIOR TO YOUR TEST Rybelsus (semaglutide) Adlyxin (lixisenatide) Victoza (liraglutide) Byetta (exanatide) ___________________________________________________________________________  Due to recent changes in healthcare laws, you may see the results of your imaging and laboratory studies on MyChart before your provider has had a chance to review them.  We understand that in some cases there may be results that are confusing or concerning to you. Not all laboratory results come back in the same time frame and the provider may be waiting for multiple results in order to interpret others.  Please give us  48 hours in order for your provider to thoroughly review all the results before contacting the office for clarification of your results.   _______________________________________________________  If your blood pressure at your visit was 140/90 or greater, please contact your primary care physician to follow up on this.  _______________________________________________________  If you are age 26 or older, your body mass index should be between 23-30. Your Body mass index is 28.8 kg/m. If this is out of the aforementioned range listed, please consider follow up with your Primary Care Provider.  If you are age 45 or younger, your body mass index should be between 19-25. Your Body mass index is 28.8 kg/m. If this is out of the aformentioned range listed, please consider follow up with your Primary  Care Provider.   ________________________________________________________  The [place] [facility] providers would like to encourage you to use MYCHART to communicate with providers for non-urgent requests or questions.  Due to long hold times on the telephone, sending your provider a message by [facility] may be a faster and more efficient way to get a response.  Please allow 48 business hours for a response.  Please remember that this is for non-urgent requests.  _______________________________________________________  [facility] is using a team-based approach to care.  Your team is made up of your doctor and two to three APPS. Our APPS (Nurse Practitioners and Physician Assistants) work with your physician to ensure care continuity for you. They are fully qualified to address your health concerns and develop a treatment plan. They communicate directly with your gastroenterologist to care for you. Seeing the Advanced Practice Practitioners on your physician's team can help you by facilitating care more promptly, often allowing for earlier appointments, access to diagnostic testing, procedures, and other specialty referrals.   _______________________________________________________  If your blood pressure at your visit was 140/90 or greater, please contact your primary care physician to follow up on this.  _______________________________________________________  If you are age 85 or older, your body mass index should be between 23-30. Your Body mass index is 28.8 kg/m. If this is out of the aforementioned range listed, please consider follow up with your Primary Care Provider.  If you are age 21 or younger, your body mass index should be between 19-25. Your Body mass index is 28.8 kg/m. If this is out of the aformentioned range listed, please consider follow up with your Primary Care Provider.   ________________________________________________________  The [facility] providers would like to  encourage you to use MYCHART to communicate with providers for non-urgent requests or questions.  Due to long hold times on the telephone, sending your provider a message by [facility] may be a faster and more efficient way to get a response.  Please allow 48 business hours for a response.  Please remember that this is for non-urgent requests.  _______________________________________________________  [facility] is using a team-based approach to care.  Your team is made up of your doctor and two to three APPS. Our APPS (Nurse Practitioners and Physician Assistants) work with your physician to ensure care continuity for you. They are fully qualified to address your health concerns and develop a treatment plan. They communicate directly with your gastroenterologist to care for you. Seeing the Advanced Practice Practitioners on your physician's team can help you by facilitating care more promptly, often allowing for earlier appointments, access to diagnostic testing, procedures, and other specialty referrals.   Thank you for trusting me with your gastrointestinal care. [name] May, FNP-C

## 2024-11-13 NOTE — Progress Notes (Signed)
 Chief Complaint:discuss colonoscopy Primary GI Doctor: Dr. Federico  HPI: 78 year old male with history of A-fib on warfarin, PE/DVT, prostate cancer presents for evaluation of colon screening colonoscopy.  10/07/24 cardiology appointment for follow-up. Reviewed entire note.   Interval History Patient last seen in GI office on 10/04/22 by Dr. Federico for colon cancer screening.She recommended one more colon screening at that time at which he decided he did not want to pursue.  Patient presents today discuss scheduling colon screening colonoscopy. He last had a colonoscopy in 05/2017 with one precancerous polyp and was recommended for 5 year follow up. Maternal aunt had colon cancer.   Patient denies altered bowel habits, abdominal pain, or blood in stool.   In the past he would experience diarrhea for years. He had been on venlafaxine  for a long time. He was weaned off of venlafaxine  and reports the diarrhea stopped.  Has history of GERD, uses Pepcid as needed. No dysphagia.  Patient on Warfarin, Has had recurrent DVT's dating back to 2008. Also has AFib   Wt Readings from Last 3 Encounters:  11/13/24 206 lb 8 oz (93.7 kg)  11/12/24 204 lb (92.5 kg)  11/11/24 205 lb (93 kg)     Past Medical History:  Diagnosis Date  . A-fib (HCC)   . Allergy    Latex  . Anxiety 2006  . Cancer [facility]) 2012   Prostate  . Cataract 2017   Surgery both eyes  . Depression 2006  . DVT (deep venous thrombosis) (HCC)   . Encounter for monitoring warfarin therapy 07/22/2019  . History of colon polyps    1 adenoma: 6- 2018  . Hypertension   . Kidney stones 01/08/2020  . Mixed hyperlipidemia 08/09/2021  . Peripheral neuropathy 08/02/2012  . Primary osteoarthritis of left hip 11/23/2021  . Pulmonary embolism (HCC)   . Pulmonary emphysema (HCC) 11/07/2022  . Pulmonary emphysema (HCC) 11/07/2022  . Substance abuse (HCC)   . Tobacco dependence 07/02/2019    Past Surgical History:  Procedure  Laterality Date  . ATRIAL FIBRILLATION ABLATION N/A 05/08/2023   Procedure: ATRIAL FIBRILLATION ABLATION;  Surgeon: Cindie Ole DASEN, MD;  Location: [facility] INVASIVE CV LAB;  Service: Cardiovascular;  Laterality: N/A;  . CARDIOVERSION N/A 07/21/2022   Procedure: CARDIOVERSION;  Surgeon: Delford Maude BROCKS, MD;  Location: [facility] ENDOSCOPY;  Service: Cardiovascular;  Laterality: N/A;  . CARDIOVERSION N/A 02/23/2023   Procedure: CARDIOVERSION;  Surgeon: Delford Maude BROCKS, MD;  Location: [facility] ENDOSCOPY;  Service: Cardiovascular;  Laterality: N/A;  . CARDIOVERSION N/A 03/22/2024   Procedure: CARDIOVERSION;  Surgeon: Pietro Redell RAMAN, MD;  Location: [facility] INVASIVE CV LAB;  Service: Cardiovascular;  Laterality: N/A;  . CORONARY ANGIOGRAPHY N/A 06/07/2024   Procedure: CORONARY ANGIOGRAPHY;  Surgeon: Wonda Sharper, MD;  Location: [facility] INVASIVE CV LAB;  Service: Cardiovascular;  Laterality: N/A;  . EYE SURGERY  2017   Cataracts  . PROSTATECTOMY    . TEE WITHOUT CARDIOVERSION N/A 02/23/2023   Procedure: TRANSESOPHAGEAL ECHOCARDIOGRAM (TEE);  Surgeon: Delford Maude BROCKS, MD;  Location: [facility] ENDOSCOPY;  Service: Cardiovascular;  Laterality: N/A;  . toe nail reomoval Bilateral   . TONSILLECTOMY    . TRANSESOPHAGEAL ECHOCARDIOGRAM (CATH LAB) N/A 03/22/2024   Procedure: TRANSESOPHAGEAL ECHOCARDIOGRAM;  Surgeon: Pietro Redell RAMAN, MD;  Location: [facility] INVASIVE CV LAB;  Service: Cardiovascular;  Laterality: N/A;    Current Outpatient Medications  Medication Sig Dispense Refill  . amitriptyline  (ELAVIL ) 50 MG tablet Take 1-2 tablets (50-100 mg total) by mouth 2 (  two) times daily as needed (Depression). 180 tablet 1  . cholecalciferol (VITAMIN D) 25 MCG (1000 UNIT) tablet Take 1,000 Units by mouth daily.    . clonazePAM  (KLONOPIN ) 0.5 MG tablet Take 1 tablet (0.5 mg total) by mouth daily as needed for anxiety. 30 tablet 5  . Melatonin 10 MG TABS Take 10 mg by mouth at bedtime.    . metoprolol  succinate (TOPROL -XL) 50 MG 24 hr  tablet TAKE 1 TABLET BY MOUTH TWICE  DAILY WITH OR IMMEDIATELY  FOLLOWING A MEAL 180 tablet 3  . Na Sulfate-K Sulfate-Mg Sulfate concentrate (SUPREP) 17.5-3.13-1.6 GM/177ML SOLN Take 1 kit (354 mLs total) by mouth once for 1 dose. 354 mL 0  . nitroGLYCERIN  (NITROSTAT ) 0.4 MG SL tablet Place 1 tablet (0.4 mg total) under the tongue every 5 (five) minutes as needed for chest pain. 25 tablet 3  . rosuvastatin  (CRESTOR ) 40 MG tablet Take 1 tablet (40 mg total) by mouth daily. 90 tablet 3  . warfarin (COUMADIN ) 5 MG tablet TAKE 1 TABLET BY MOUTH DAILY AS  DIRECTED BY COUMADIN  CLINIC 90 tablet 3   No current facility-administered medications for this visit.    Allergies as of 11/13/2024 - Review Complete 11/13/2024  Allergen Reaction Noted  . Amlodipine  Other (See Comments) 03/20/2024  . Atorvastatin  Other (See Comments) 09/01/2021  . Oxycodone Other (See Comments) 03/02/2021  . Latex Rash 03/02/2021    Family History  Problem Relation Age of Onset  . Anxiety disorder Mother   . Cancer Mother   . Stroke Mother   . Alcohol abuse Father   . Early death Father   . Anxiety disorder Brother   . Depression Brother   . Anxiety disorder Daughter   . Depression Daughter   . Anxiety disorder Brother   . Depression Brother   . Anxiety disorder Daughter   . Depression Daughter   . Esophageal cancer Neg Hx     Review of Systems:    Constitutional: No weight loss, fever, chills, weakness or fatigue HEENT: Eyes: No change in vision               Ears, Nose, Throat:  No change in hearing or congestion Skin: No rash or itching Cardiovascular: No chest pain, chest pressure or palpitations   Respiratory: No SOB or cough Gastrointestinal: See HPI and otherwise negative Genitourinary: No dysuria or change in urinary frequency Neurological: No headache, dizziness or syncope Musculoskeletal: No new muscle or joint pain Hematologic: No bleeding or bruising Psychiatric: No history of depression or  anxiety    Physical Exam:  Vital signs: BP 120/70 (BP Location: Left Arm, Patient Position: Sitting, Cuff Size: Normal)   Pulse 74   Ht 5' 11 (1.803 m)   Wt 206 lb 8 oz (93.7 kg)   BMI 28.80 kg/m   Constitutional:   Pleasant male appears to be in NAD, Well developed, Well nourished, alert and cooperative Throat: Oral cavity and pharynx without inflammation, swelling or lesion.  Respiratory: Respirations even and unlabored. Lungs clear to auscultation bilaterally.   No wheezes, crackles, or rhonchi.  Cardiovascular: Normal S1, S2. Regular rate and rhythm. No peripheral edema, cyanosis or pallor.  Gastrointestinal:  Soft, nondistended, nontender. No rebound or guarding. Normal bowel sounds. No appreciable masses or hepatomegaly. Rectal:  Not performed.  Msk:  Symmetrical without gross deformities. Without edema, no deformity or joint abnormality.  Neurologic:  Alert and  oriented x4;  grossly normal neurologically.  Skin:   Dry and intact  without significant lesions or rashes.  RELEVANT LABS AND IMAGING: CBC    Latest Ref Rng & Units 05/21/2024   10:20 AM 03/20/2024    2:01 PM 08/26/2023    1:37 PM  CBC  WBC 3.4 - 10.8 x10E3/uL 8.5  7.6  7.3   Hemoglobin 13.0 - 17.7 g/dL 85.3  85.5  86.8   Hematocrit 37.5 - 51.0 % 44.4  44.0  39.2   Platelets 150 - 450 x10E3/uL 245  217  175      CMP     Latest Ref Rng & Units 10/02/2024    9:01 AM 06/27/2024    9:43 AM 05/21/2024   10:20 AM  CMP  Glucose 70 - 99 mg/dL   95   BUN 8 - 27 mg/dL   17   Creatinine 9.23 - 1.27 mg/dL   8.85   Sodium 865 - 855 mmol/L   140   Potassium 3.5 - 5.2 mmol/L   5.2   Chloride 96 - 106 mmol/L   104   CO2 20 - 29 mmol/L   21   Calcium  8.6 - 10.2 mg/dL   9.5   Total Protein 6.0 - 8.5 g/dL 6.8  7.2    Total Bilirubin 0.0 - 1.2 mg/dL 0.3  0.3    Alkaline Phos 47 - 123 IU/L 63  74    AST 0 - 40 IU/L 18  19    ALT 0 - 44 IU/L 17  15       Lab Results  Component Value Date   TSH 1.520 08/22/2023   Colonoscopy 06/06/17: One 8 mm rectosigmoid polyp that was resected with hot snare. 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 External and internal hemorrhoids Path: TA  Assessment: Encounter Diagnoses  Name Primary?  . History of colonic polyps Yes  . Heartburn   Patient last had a colonoscopy in 2018 with one small TA that was removed. Prior to that he had a normal colonoscopy in 2008. He is not having any symptoms at this time. I went over the risks and benefits of polyp surveillance with the patient in detail. The benefits would include prevention of colon cancer in the future. The risks would include procedural related risks, sedation risks, and the risk of coming off of warfarin temporarily for the procedure. Dr. Federico recommended that based upon her assessment of his age, medical history, and history of colon polyps, she would recommend continuing with one additional colonoscopy. With the above information, patient has decided to proceed with colonoscopy. Patient has occasional heartburn controlled with pepcid as needed. Also gave him samples of gourmet reflux. Declined need for EGD.  Plan: -Schedule for a colonoscopy in [facility] with Dr. Federico in January. The risks and benefits of colonoscopy with possible polypectomy / biopsies were discussed and the patient agrees to proceed.  -Cardiac clearance for Warfarin  Hold Warfarin  5 days before procedure - will instruct when and how to resume after procedure. Risks and benefits of procedure including bleeding, perforation, infection, missed lesions, medication reactions and possible hospitalization or surgery if complications occur explained. Additional rare but real risk of cardiovascular event such as heart attack or ischemia/infarct of other organs off Warfarin explained and need to seek urgent help if this occurs. Will communicate by phone or EMR with patient's prescribing provider that to confirm holding Warfarin is reasonable in  this case.     Thank you for the courtesy of this consult. Please call me with  any questions or concerns.   Ysidro Ramsay, FNP-C [facility] 11/13/2024, 11:32 AM  Cc: Willo Mini, NP

## 2024-11-14 NOTE — Progress Notes (Signed)
 Agree with assessment and plan as outlined.

## 2024-11-18 ENCOUNTER — Encounter: Payer: Self-pay | Admitting: Acute Care

## 2024-11-18 DIAGNOSIS — Z87891 Personal history of nicotine dependence: Secondary | ICD-10-CM

## 2024-11-18 DIAGNOSIS — Z122 Encounter for screening for malignant neoplasm of respiratory organs: Secondary | ICD-10-CM

## 2024-11-18 DIAGNOSIS — F17210 Nicotine dependence, cigarettes, uncomplicated: Secondary | ICD-10-CM

## 2024-12-12 ENCOUNTER — Ambulatory Visit: Attending: Cardiovascular Disease

## 2024-12-12 ENCOUNTER — Telehealth: Payer: Self-pay | Admitting: Gastroenterology

## 2024-12-12 ENCOUNTER — Telehealth: Payer: Self-pay | Admitting: *Deleted

## 2024-12-12 DIAGNOSIS — Z5181 Encounter for therapeutic drug level monitoring: Secondary | ICD-10-CM | POA: Diagnosis not present

## 2024-12-12 DIAGNOSIS — I4892 Unspecified atrial flutter: Secondary | ICD-10-CM

## 2024-12-12 LAB — POCT INR: POC INR: 3.3

## 2024-12-12 NOTE — Progress Notes (Signed)
 Lab Results  Component Value Date   INR 3.3 12/12/2024   INR 2.4 10/31/2024   INR 2.5 10/03/2024    Description   INR 3.3; Hold warfarin today then continue to take warfarin 5 mg daily except for 7.5 mg on Mondays.  Recheck INR in 1 week post procedure.  Anticoagulation Clinic [phone]

## 2024-12-12 NOTE — Telephone Encounter (Signed)
 error

## 2024-12-12 NOTE — Telephone Encounter (Signed)
 Pre-op team,   Not sure if this has come through as an official request yet. Please reach out to requesting office.   Thank you!  DW

## 2024-12-12 NOTE — Telephone Encounter (Addendum)
 Pt came to coumadin  appointment today and stated that he is to have a colonoscopy on 01/01/2024 by [facility] (Dr. Rosario Kidney). Pt stated that he has the prep for the colonoscopy.   Pt saw [name] May 11/13/2024- Per note Hold warfarin 5 days prior to procedure for colonoscopy.     Called and spoke to [name] from [facility]. She stated that she would fax over a clearance request for a colonoscopy to [facility] preop fax #[phone]

## 2024-12-12 NOTE — Patient Instructions (Signed)
 Description   INR 3.3; Hold warfarin today then continue to take warfarin 5 mg daily except for 7.5 mg on Mondays.  Recheck INR in 1 week post procedure.  Anticoagulation Clinic [phone]

## 2024-12-12 NOTE — Telephone Encounter (Signed)
 Isaiah with [facility] is calling to have a clearance for the patient's colonoscopy faxed to them at [phone]. The one they have is for EGD and patient is no longer having that procedure. Please advise.

## 2024-12-12 NOTE — Telephone Encounter (Signed)
 I sent a secure chat to requesting office that we need a clearance request to be faxed to our office ASAP fax [phone] preop team

## 2024-12-13 NOTE — Telephone Encounter (Signed)
 ADDENDUM TO PROCEDURE: COLONOSCOPY NOT ENDOSCOPY.   Please see all notes.     I sent CMA [name] PARAS secure chat:  ME: good afternoon [name] I was just following up if a clearance request has been faxed over to us  yet? [phone] preo team please     [name]: I've already received the clearance for his procedure back in November. its in his chart. It won't let me screenschot it   Me:  but what I understand is he is having a colon now and not endo, correct . if so then that is a different procedure. this was the note our coumadin  RN sent me: Larry Isaiah NOVAK RN  12/12/24 10:25 AM Note Pt came to coumadin  appointment today and stated that he is to have a colonoscopy on 01/01/2024 by [facility] (Dr. Rosario Kidney). Pt stated that he has the prep for the colonoscopy.   Pt saw [name] May 11/13/2024- Per note Hold warfarin 5 days prior to procedure for colonoscopy.   Called and spoke to [name] from [facility]. She stated that she would fax over a clearance request for a colonoscopy to [facility] preop fax #[phone]  [name]: An Endoscopic procedure can be either EGD or Colonoscopy. It just means a scope will be used.   Me: ok I understand that but since the the other says endoscopic procedure, that can be confusing to someone. Cardiology likes to have specifics to be clear to the procedure that is being done. What I will do is update the preop clearance request. I guess since the pt told the coumadin  RN as well that it was thought as another procedure was being done. if Dr. Janese is good with the clearance from 10/2024 then we are all set. I will just note on that 10/2024 procedure is actually a colonoscopy. thank you for all of your help.

## 2024-12-13 NOTE — Telephone Encounter (Signed)
 I sent a secure chat to [employer]. CMA if preop clearance has been faxed over yet as I have not received the request.

## 2024-12-13 NOTE — Telephone Encounter (Signed)
 See notes from GI :  Waiting on clearance request for colonoscopy to be faxed to our office.    All Conversations (Newest Message First)              12/12/24  4:17 PM McKew, Almarie LABOR, LPN routed this conversation to Vicci Aquas, [facility]    12/12/24  4:07 PM Gibson, Shonpryl L routed this conversation to [employer] B Triage    12/12/24 11:12 AM Gibson Furlough L routed this conversation to Vicci Aquas, CMA  Gibson Furlough [facility]   12/12/24 11:12 AM Note Isaiah with [facility] is calling to have a clearance for the patient's colonoscopy faxed to them at [phone]. The one they have is for EGD and patient is no longer having that procedure. Please advise.

## 2024-12-13 NOTE — Telephone Encounter (Signed)
 I sent CMA [name] PARAS secure chat:  ME: good afternoon [name] I was just following up if a clearance request has been faxed over to us  yet? [phone] preo team please     [name]: I've already received the clearance for his procedure back in November. its in his chart. It won't let me screenschot it   Me:  but what I understand is he is having a colon now and not endo, correct . if so then that is a different procedure. this was the note our coumadin  RN sent me: Larry Isaiah NOVAK RN  12/12/24 10:25 AM Note Pt came to coumadin  appointment today and stated that he is to have a colonoscopy on 01/01/2024 by [facility] (Dr. Rosario Kidney). Pt stated that he has the prep for the colonoscopy.   Pt saw [name] May 11/13/2024- Per note Hold warfarin 5 days prior to procedure for colonoscopy.   Called and spoke to [name] from [facility]. She stated that she would fax over a clearance request for a colonoscopy to [facility] preop fax #[phone]  [name]: An Endoscopic procedure can be either EGD or Colonoscopy. It just means a scope will be used.   Me: ok I understand that but since the the other says endoscopic procedure, that can be confusing to someone. cardiology likes to have specifics to be clear to the procedure that is being done. What I will do is update the preop clearance request. I guess since the pt told the coumadin  RN as well that it was thought as another procedure was being done. if Dr. Janese is good with the clearance from 10/2024 then we are all set. I will just note on that 10/2024 procedure is actually a colonoscopy. thank you for all of your help.

## 2024-12-23 NOTE — Telephone Encounter (Signed)
 Per Dr Delford, ok to hold warfarin for 3 days. No Lovenox required.

## 2024-12-31 ENCOUNTER — Ambulatory Visit: Admitting: Internal Medicine

## 2024-12-31 ENCOUNTER — Encounter: Payer: Self-pay | Admitting: Internal Medicine

## 2024-12-31 VITALS — BP 117/57 | HR 74 | Temp 97.7°F | Resp 14 | Ht 71.0 in | Wt 206.8 lb

## 2024-12-31 DIAGNOSIS — Z860100 Personal history of colon polyps, unspecified: Secondary | ICD-10-CM | POA: Diagnosis not present

## 2024-12-31 DIAGNOSIS — K648 Other hemorrhoids: Secondary | ICD-10-CM

## 2024-12-31 DIAGNOSIS — D128 Benign neoplasm of rectum: Secondary | ICD-10-CM

## 2024-12-31 DIAGNOSIS — D123 Benign neoplasm of transverse colon: Secondary | ICD-10-CM | POA: Diagnosis not present

## 2024-12-31 DIAGNOSIS — Z1211 Encounter for screening for malignant neoplasm of colon: Secondary | ICD-10-CM | POA: Diagnosis not present

## 2024-12-31 DIAGNOSIS — K621 Rectal polyp: Secondary | ICD-10-CM | POA: Diagnosis not present

## 2024-12-31 DIAGNOSIS — K5730 Diverticulosis of large intestine without perforation or abscess without bleeding: Secondary | ICD-10-CM | POA: Diagnosis not present

## 2024-12-31 DIAGNOSIS — D122 Benign neoplasm of ascending colon: Secondary | ICD-10-CM

## 2024-12-31 DIAGNOSIS — D120 Benign neoplasm of cecum: Secondary | ICD-10-CM

## 2024-12-31 MED ORDER — SODIUM CHLORIDE 0.9 % IV SOLN: 500.0000 mL | Freq: Once | INTRAVENOUS | Status: DC

## 2024-12-31 NOTE — Op Note (Addendum)
 [facility] Patient Name: Larry Odonnell Procedure Date: 12/31/2024 11:07 AM MRN: [id number] Endoscopist: Rosario Estefana Kidney , , [id number] Age: 79 Referring MD:  Date of Birth: 1946/07/12 Gender: Male Account #: [id number] Procedure:                Colonoscopy Indications:              High risk colon cancer surveillance: Personal                            history of colonic polyps Medicines:                Monitored Anesthesia Care Procedure:                Pre-Anesthesia Assessment:                           - Prior to the procedure, a History and Physical                            was performed, and patient medications and                            allergies were reviewed. The patient's tolerance of                            previous anesthesia was also reviewed. The risks                            and benefits of the procedure and the sedation                            options and risks were discussed with the patient.                            All questions were answered, and informed consent                            was obtained. Prior Anticoagulants: The patient has                            taken Coumadin  (warfarin), last dose was 6 days                            prior to procedure. ASA Grade Assessment: III - A                            patient with severe systemic disease. After                            reviewing the risks and benefits, the patient was                            deemed in satisfactory condition to undergo the  procedure.                           After obtaining informed consent, the colonoscope                            was passed under direct vision. Throughout the                            procedure, the patient's blood pressure, pulse, and                            oxygen saturations were monitored continuously. The                            Olympus Scope [id number] was introduced through the                             anus and advanced to the the terminal ileum. The                            colonoscopy was performed without difficulty. The                            patient tolerated the procedure well. The quality                            of the bowel preparation was excellent. The                            terminal ileum, ileocecal valve, appendiceal                            orifice, and rectum were photographed. Scope In: 11:18:32 AM Scope Out: 11:45:18 AM Scope Withdrawal Time: 0 hours 20 minutes 10 seconds  Total Procedure Duration: 0 hours 26 minutes 46 seconds  Findings:                 The terminal ileum appeared normal.                           Four sessile polyps were found in the transverse                            colon, ascending colon and cecum. The polyps were 3                            to 11 mm in size. These polyps were removed with a                            cold snare. Resection and retrieval were complete.                           Multiple diverticula were found in the sigmoid  colon and descending colon.                           A 4 mm polyp was found in the rectum. The polyp was                            sessile. The polyp was removed with a cold snare.                            Resection and retrieval were complete.                           Non-bleeding internal hemorrhoids were found during                            retroflexion. Complications:            No immediate complications. Estimated Blood Loss:     Estimated blood loss was minimal. Impression:               - The examined portion of the ileum was normal.                           - Four 3 to 11 mm polyps in the transverse colon,                            in the ascending colon and in the cecum, removed                            with a cold snare. Resected and retrieved.                           - Diverticulosis in the sigmoid colon and in the                             descending colon.                           - One 4 mm polyp in the rectum, removed with a cold                            snare. Resected and retrieved.                           - Non-bleeding internal hemorrhoids. Recommendation:           - Discharge patient to home (with escort).                           - Await pathology results.                           - Okay to restart warfarin today.                           - The  findings and recommendations were discussed                            with the patient. Dr Estefana Federico Rosario Estefana Federico,  12/31/2024 11:49:08 AM

## 2024-12-31 NOTE — Progress Notes (Signed)
 Pt's states no medical or surgical changes since previsit or office visit.

## 2024-12-31 NOTE — Progress Notes (Signed)
 Vss nad trans to pacu

## 2024-12-31 NOTE — Progress Notes (Signed)
 "   GASTROENTEROLOGY PROCEDURE H&P NOTE   Primary Care Physician: Willo Mini, NP    Reason for Procedure:   History of colon polyps  Plan:    Colonoscopy  Patient is appropriate for endoscopic procedure(s) in the ambulatory ([facility]) setting.  The nature of the procedure, as well as the risks, benefits, and alternatives were carefully and thoroughly reviewed with the patient. Ample time for discussion and questions allowed. The patient understood, was satisfied, and agreed to proceed.     HPI: Larry Odonnell is a 79 y.o. male who presents for colonoscopy for evaluation of history of colon polyps.  Patient was most recently seen in the Gastroenterology Clinic on 11/13/24.  No interval change in medical history since that appointment. Please refer to that note for full details regarding GI history and clinical presentation.   Colonoscopy 06/06/17: One 8 mm rectosigmoid polyp that was resected with hot snare. 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 External and internal hemorrhoids Path: TA  Past Medical History:  Diagnosis Date   A-fib [facility])    Allergy    Latex   Anxiety 2006   Cancer [facility]) 2012   Prostate   Cataract 2017   Surgery both eyes   Depression 2006   DVT (deep venous thrombosis) (HCC)    Encounter for monitoring warfarin therapy 07/22/2019   History of colon polyps    1 adenoma: 6- 2018   Hypertension    Kidney stones 01/08/2020   Mixed hyperlipidemia 08/09/2021   Peripheral neuropathy 08/02/2012   Primary osteoarthritis of left hip 11/23/2021   Pulmonary embolism (HCC)    Pulmonary emphysema (HCC) 11/07/2022   Pulmonary emphysema (HCC) 11/07/2022   Substance abuse (HCC)    Tobacco dependence 07/02/2019    Past Surgical History:  Procedure Laterality Date   ATRIAL FIBRILLATION ABLATION N/A 05/08/2023   Procedure: ATRIAL FIBRILLATION ABLATION;  Surgeon: Cindie Ole DASEN, MD;  Location: [facility] INVASIVE CV LAB;  Service: Cardiovascular;   Laterality: N/A;   CARDIOVERSION N/A 07/21/2022   Procedure: CARDIOVERSION;  Surgeon: Delford Maude BROCKS, MD;  Location: [facility] ENDOSCOPY;  Service: Cardiovascular;  Laterality: N/A;   CARDIOVERSION N/A 02/23/2023   Procedure: CARDIOVERSION;  Surgeon: Delford Maude BROCKS, MD;  Location: [facility] ENDOSCOPY;  Service: Cardiovascular;  Laterality: N/A;   CARDIOVERSION N/A 03/22/2024   Procedure: CARDIOVERSION;  Surgeon: Pietro Redell RAMAN, MD;  Location: [facility] INVASIVE CV LAB;  Service: Cardiovascular;  Laterality: N/A;   CORONARY ANGIOGRAPHY N/A 06/07/2024   Procedure: CORONARY ANGIOGRAPHY;  Surgeon: Wonda Sharper, MD;  Location: [facility] INVASIVE CV LAB;  Service: Cardiovascular;  Laterality: N/A;   EYE SURGERY  2017   Cataracts   PROSTATECTOMY     TEE WITHOUT CARDIOVERSION N/A 02/23/2023   Procedure: TRANSESOPHAGEAL ECHOCARDIOGRAM (TEE);  Surgeon: Delford Maude BROCKS, MD;  Location: [facility] ENDOSCOPY;  Service: Cardiovascular;  Laterality: N/A;   toe nail reomoval Bilateral    TONSILLECTOMY     TRANSESOPHAGEAL ECHOCARDIOGRAM (CATH LAB) N/A 03/22/2024   Procedure: TRANSESOPHAGEAL ECHOCARDIOGRAM;  Surgeon: Pietro Redell RAMAN, MD;  Location: [facility] INVASIVE CV LAB;  Service: Cardiovascular;  Laterality: N/A;    Prior to Admission medications  Medication Sig Start Date End Date Taking? Authorizing Provider  amitriptyline  (ELAVIL ) 50 MG tablet Take 1-2 tablets (50-100 mg total) by mouth 2 (two) times daily as needed (Depression). 05/06/24   Willo Mini, NP  cholecalciferol (VITAMIN D) 25 MCG (1000 UNIT) tablet Take 1,000 Units by mouth daily.    [provider]  clonazePAM  (KLONOPIN ) 0.5 MG tablet Take 1 tablet (0.5 mg total) by mouth daily as needed for anxiety. 11/12/24   Willo Mini, NP  Melatonin 10 MG TABS Take 10 mg by mouth at bedtime.    [provider]  metoprolol  succinate (TOPROL -XL) 50 MG 24 hr tablet TAKE 1 TABLET BY MOUTH TWICE  DAILY WITH OR IMMEDIATELY  FOLLOWING A MEAL 08/27/24   Cleaver, Josefa HERO, NP   nitroGLYCERIN  (NITROSTAT ) 0.4 MG SL tablet Place 1 tablet (0.4 mg total) under the tongue every 5 (five) minutes as needed for chest pain. 05/03/24   Nishan, Peter C, MD  rosuvastatin  (CRESTOR ) 40 MG tablet Take 1 tablet (40 mg total) by mouth daily. 10/03/24   Nishan, Peter C, MD  warfarin (COUMADIN ) 5 MG tablet TAKE 1 TABLET BY MOUTH DAILY AS  DIRECTED BY COUMADIN  CLINIC 06/19/24   Cindie Ole DASEN, MD    Current Outpatient Medications  Medication Sig Dispense Refill   amitriptyline  (ELAVIL ) 50 MG tablet Take 1-2 tablets (50-100 mg total) by mouth 2 (two) times daily as needed (Depression). 180 tablet 1   cholecalciferol (VITAMIN D) 25 MCG (1000 UNIT) tablet Take 1,000 Units by mouth daily.     clonazePAM  (KLONOPIN ) 0.5 MG tablet Take 1 tablet (0.5 mg total) by mouth daily as needed for anxiety. 30 tablet 5   Melatonin 10 MG TABS Take 10 mg by mouth at bedtime.     metoprolol  succinate (TOPROL -XL) 50 MG 24 hr tablet TAKE 1 TABLET BY MOUTH TWICE  DAILY WITH OR IMMEDIATELY  FOLLOWING A MEAL 180 tablet 3   nitroGLYCERIN  (NITROSTAT ) 0.4 MG SL tablet Place 1 tablet (0.4 mg total) under the tongue every 5 (five) minutes as needed for chest pain. 25 tablet 3   rosuvastatin  (CRESTOR ) 40 MG tablet Take 1 tablet (40 mg total) by mouth daily. 90 tablet 3   warfarin (COUMADIN ) 5 MG tablet TAKE 1 TABLET BY MOUTH DAILY AS  DIRECTED BY COUMADIN  CLINIC 90 tablet 3   Current Facility-Administered Medications  Medication Dose Route Frequency Provider Last Rate Last Admin   0.9 %  sodium chloride  infusion  500 mL Intravenous Once Federico Rosario BROCKS, MD        Allergies as of 12/31/2024 - Review Complete 12/31/2024  Allergen Reaction Noted   Amlodipine  Other (See Comments) 03/20/2024   Oxycodone Other (See Comments) 03/02/2021   Atorvastatin  Other (See Comments) 09/01/2021   Latex Rash 03/02/2021    Family History  Problem Relation Age of Onset   Anxiety disorder Mother    Cancer Mother    Stroke Mother     Alcohol abuse Father    Early death Father    Anxiety disorder Brother    Depression Brother    Anxiety disorder Daughter    Depression Daughter    Anxiety disorder Brother    Depression Brother    Anxiety disorder Daughter    Depression Daughter    Esophageal cancer Neg Hx     Social History   Socioeconomic History   Marital status: Married    Spouse name: Therisa CHARLENA Sam   Number of children: 1   Years of education: 13   Highest education level: Some college, no degree  Occupational History   Occupation: Retired  Tobacco Use   Smoking status: Every Day    Current packs/day: 0.00    Average packs/day: 1.2 packs/day for 52.4 years (61.4 ttl pk-yrs)    Types: Cigarettes    Start date: 2020  Last attempt to quit: 05/16/2024    Years since quitting: 0.6   Smokeless tobacco: Never   Tobacco comments:    Quit [id number]  Vaping Use   Vaping status: Never Used  Substance and Sexual Activity   Alcohol use: Not Currently    Comment: No alcohol since 1995   Drug use: Never   Sexual activity: Not Currently    Birth control/protection: None  Other Topics Concern   Not on file  Social History Narrative   Lives with wife. He enjoys yardwork.   Social Drivers of Health   Tobacco Use: High Risk (11/13/2024)   Patient History    Smoking Tobacco Use: Every Day    Smokeless Tobacco Use: Never    Passive Exposure: Not on file  Financial Resource Strain: Low Risk (11/09/2024)   Overall Financial Resource Strain (CARDIA)    Difficulty of Paying Living Expenses: Not hard at all  Food Insecurity: No Food Insecurity (11/09/2024)   Epic    Worried About Programme Researcher, Broadcasting/film/video in the Last Year: Never true    Ran Out of Food in the Last Year: Never true  Transportation Needs: No Transportation Needs (11/09/2024)   Epic    Lack of Transportation (Medical): No    Lack of Transportation (Non-Medical): No  Physical Activity: Insufficiently Active (11/09/2024)   Exercise Vital Sign     Days of Exercise per Week: 1 day    Minutes of Exercise per Session: 30 min  Stress: No Stress Concern Present (11/09/2024)   [employer] of Occupational Health - Occupational Stress Questionnaire    Feeling of Stress: Not at all  Social Connections: Moderately Isolated (11/09/2024)   Social Connection and Isolation Panel    Frequency of Communication with Friends and Family: Once a week    Frequency of Social Gatherings with Friends and Family: Once a week    Attends Religious Services: More than 4 times per year    Active Member of [employer] or Organizations: No    Attends Banker Meetings: Not on file    Marital Status: Married  Intimate Partner Violence: Not At Risk (09/05/2024)   Epic    Fear of Current or Ex-Partner: No    Emotionally Abused: No    Physically Abused: No    Sexually Abused: No  Depression (PHQ2-9): Medium Risk (11/12/2024)   Depression (PHQ2-9)    PHQ-2 Score: 5  Alcohol Screen: Low Risk (09/05/2024)   Alcohol Screen    Last Alcohol Screening Score (AUDIT): 0  Housing: Unknown (11/09/2024)   Epic    Unable to Pay for Housing in the Last Year: No    Number of Times Moved in the Last Year: Not on file    Homeless in the Last Year: No  Utilities: Not At Risk (09/05/2024)   Epic    Threatened with loss of utilities: No  Health Literacy: Adequate Health Literacy (09/05/2024)   B1300 Health Literacy    Frequency of need for help with medical instructions: Never    Physical Exam: Vital signs in last 24 hours: BP (!) 110/58   Pulse (!) 106   Temp 97.7 F (36.5 C)   Ht 5' 11 (1.803 m)   Wt 206 lb 12.8 oz (93.8 kg)   SpO2 96%   BMI 28.84 kg/m  GEN: NAD EYE: Sclerae anicteric ENT: MMM CV: Non-tachycardic Pulm: No increased WOB GI: Soft NEURO:  Alert & Oriented   Estefana Kidney, MD [facility]   12/31/2024  10:11 AM  "

## 2024-12-31 NOTE — Progress Notes (Signed)
 Called to room to assist during endoscopic procedure.  Patient ID and intended procedure confirmed with present staff. Received instructions for my participation in the procedure from the performing physician.

## 2024-12-31 NOTE — Patient Instructions (Signed)
-   Discharge patient to home (with escort). - Await pathology results. - The findings and recommendations were discussed with the patient.   YOU HAD AN ENDOSCOPIC PROCEDURE TODAY AT THE [facility]:   Refer to the procedure report that was given to you for any specific questions about what was found during the examination.  If the procedure report does not answer your questions, please call your gastroenterologist to clarify.  If you requested that your care partner not be given the details of your procedure findings, then the procedure report has been included in a sealed envelope for you to review at your convenience later.  YOU SHOULD EXPECT: Some feelings of bloating in the abdomen. Passage of more gas than usual.  Walking can help get rid of the air that was put into your GI tract during the procedure and reduce the bloating. If you had a lower endoscopy (such as a colonoscopy or flexible sigmoidoscopy) you may notice spotting of blood in your stool or on the toilet paper. If you underwent a bowel prep for your procedure, you may not have a normal bowel movement for a few days.  Please Note:  You might notice some irritation and congestion in your nose or some drainage.  This is from the oxygen used during your procedure.  There is no need for concern and it should clear up in a day or so.  SYMPTOMS TO REPORT IMMEDIATELY:  Following lower endoscopy (colonoscopy or flexible sigmoidoscopy):  Excessive amounts of blood in the stool  Significant tenderness or worsening of abdominal pains  Swelling of the abdomen that is new, acute  Fever of 100F or higher  For urgent or emergent issues, a gastroenterologist can be reached at any hour by calling [phone]. Do not use MyChart messaging for urgent concerns.    DIET:  We do recommend a small meal at first, but then you may proceed to your regular diet.  Drink plenty of fluids but you should avoid alcoholic beverages for 24  hours.  ACTIVITY:  You should plan to take it easy for the rest of today and you should NOT DRIVE or use heavy machinery until tomorrow (because of the sedation medicines used during the test).    FOLLOW UP: Our staff will call the number listed on your records the next business day following your procedure.  We will call around 7:15- 8:00 am to check on you and address any questions or concerns that you may have regarding the information given to you following your procedure. If we do not reach you, we will leave a message.     If any biopsies were taken you will be contacted by phone or by letter within the next 1-3 weeks.  Please call us at [phone] if you have not heard about the biopsies in 3 weeks.    SIGNATURES/CONFIDENTIALITY: You and/or your care partner have signed paperwork which will be entered into your electronic medical record.  These signatures attest to the fact that that the information above on your After Visit Summary has been reviewed and is understood.  Full responsibility of the confidentiality of this discharge information lies with you and/or your care-partner.

## 2025-01-01 ENCOUNTER — Telehealth: Payer: Self-pay

## 2025-01-01 NOTE — Telephone Encounter (Signed)
" °  Follow up Call-     12/31/2024   10:04 AM  Call back number  Post procedure Call Back phone  # [phone]  Permission to leave phone message Yes     Patient questions:  Do you have a fever, pain , or abdominal swelling? No. Pain Score  0 *  Have you tolerated food without any problems? Yes.    Have you been able to return to your normal activities? Yes.    Do you have any questions about your discharge instructions: Diet   No. Medications  No. Follow up visit  No.  Do you have questions or concerns about your Care? No.  Actions: * If pain score is 4 or above: No action needed, pain <4.   "

## 2025-01-01 NOTE — Telephone Encounter (Signed)
 Patient had colonoscopy completed on 12/31/24. Removing from preop pool  Rollo FABIENE Louder, PA-C 01/01/2025 9:19 AM

## 2025-01-02 ENCOUNTER — Ambulatory Visit: Payer: Self-pay | Admitting: Internal Medicine

## 2025-01-02 LAB — SURGICAL PATHOLOGY

## 2025-01-05 ENCOUNTER — Encounter: Payer: Self-pay | Admitting: Cardiovascular Disease

## 2025-01-07 ENCOUNTER — Ambulatory Visit: Admitting: *Deleted

## 2025-01-07 DIAGNOSIS — Z5181 Encounter for therapeutic drug level monitoring: Secondary | ICD-10-CM

## 2025-01-07 DIAGNOSIS — I824Y9 Acute embolism and thrombosis of unspecified deep veins of unspecified proximal lower extremity: Secondary | ICD-10-CM

## 2025-01-07 DIAGNOSIS — I4819 Other persistent atrial fibrillation: Secondary | ICD-10-CM | POA: Diagnosis not present

## 2025-01-07 DIAGNOSIS — I4892 Unspecified atrial flutter: Secondary | ICD-10-CM | POA: Diagnosis not present

## 2025-01-07 DIAGNOSIS — I2693 Single subsegmental pulmonary embolism without acute cor pulmonale: Secondary | ICD-10-CM | POA: Diagnosis not present

## 2025-01-07 LAB — LIPID PANEL
Chol/HDL Ratio: 2.5 ratio (ref 0.0–5.0)
Cholesterol, Total: 122 mg/dL (ref 100–199)
HDL: 48 mg/dL
LDL Chol Calc (NIH): 59 mg/dL (ref 0–99)
Triglycerides: 76 mg/dL (ref 0–149)
VLDL Cholesterol Cal: 15 mg/dL (ref 5–40)

## 2025-01-07 LAB — HEPATIC FUNCTION PANEL
ALT: 20 IU/L (ref 0–44)
AST: 23 IU/L (ref 0–40)
Albumin: 4 g/dL (ref 3.8–4.8)
Alkaline Phosphatase: 62 IU/L (ref 47–123)
Bilirubin Total: 0.3 mg/dL (ref 0.0–1.2)
Bilirubin, Direct: 0.12 mg/dL (ref 0.00–0.40)
Total Protein: 6.8 g/dL (ref 6.0–8.5)

## 2025-01-07 LAB — POCT INR: INR: 1.5 — AB (ref 2.0–3.0)

## 2025-01-07 NOTE — Progress Notes (Signed)
 INR Goal 2-3 Description   INR-1.5; Today and tomorrow take 7.5mg  (1.5 tablets) of warfarin then continue to take warfarin 5mg  daily except for 7.5mg  on Mondays.  Recheck INR in 1 week.  Anticoagulation Clinic [phone]

## 2025-01-07 NOTE — Patient Instructions (Signed)
 Description   INR-1.5; Today and tomorrow take 7.5mg  (1.5 tablets) of warfarin then continue to take warfarin 5mg  daily except for 7.5mg  on Mondays.  Recheck INR in 1 week.  Anticoagulation Clinic [phone]

## 2025-01-08 ENCOUNTER — Ambulatory Visit: Payer: Self-pay | Admitting: Cardiovascular Disease

## 2025-01-09 NOTE — Progress Notes (Signed)
 "   Cardiology Clinic Note   Patient Name: Larry Odonnell Date of Encounter: 01/22/2025  Primary Care Provider:  Willo Mini, NP Primary Cardiologist:  Maude Emmer, MD  Patient Profile    Larry Odonnell 80 year old male presents to the clinic today for follow-up. History of  CAD, PAF, HTN, DVT, HLD, smoking and emphysema  Past Medical History    Past Medical History:  Diagnosis Date   A-fib [facility])    Allergy    Latex   Anxiety 2006   Cancer [facility]) 2012   Prostate   Cataract 2017   Surgery both eyes   Depression 2006   DVT (deep venous thrombosis) (HCC)    Encounter for monitoring warfarin therapy 07/22/2019   History of colon polyps    1 adenoma: 6- 2018   Hypertension    Kidney stones 01/08/2020   Mixed hyperlipidemia 08/09/2021   Peripheral neuropathy 08/02/2012   Primary osteoarthritis of left hip 11/23/2021   Pulmonary embolism (HCC)    Pulmonary emphysema (HCC) 11/07/2022   Pulmonary emphysema (HCC) 11/07/2022   Substance abuse (HCC)    Tobacco dependence 07/02/2019   Past Surgical History:  Procedure Laterality Date   ATRIAL FIBRILLATION ABLATION N/A 05/08/2023   Procedure: ATRIAL FIBRILLATION ABLATION;  Surgeon: Cindie Ole DASEN, MD;  Location: [facility] INVASIVE CV LAB;  Service: Cardiovascular;  Laterality: N/A;   CARDIOVERSION N/A 07/21/2022   Procedure: CARDIOVERSION;  Surgeon: Emmer Maude BROCKS, MD;  Location: [facility] ENDOSCOPY;  Service: Cardiovascular;  Laterality: N/A;   CARDIOVERSION N/A 02/23/2023   Procedure: CARDIOVERSION;  Surgeon: Emmer Maude BROCKS, MD;  Location: [facility] ENDOSCOPY;  Service: Cardiovascular;  Laterality: N/A;   CARDIOVERSION N/A 03/22/2024   Procedure: CARDIOVERSION;  Surgeon: Pietro Redell RAMAN, MD;  Location: [facility] INVASIVE CV LAB;  Service: Cardiovascular;  Laterality: N/A;   CORONARY ANGIOGRAPHY N/A 06/07/2024   Procedure: CORONARY ANGIOGRAPHY;  Surgeon: Wonda Sharper, MD;  Location: [facility] INVASIVE CV LAB;  Service: Cardiovascular;   Laterality: N/A;   EYE SURGERY  2017   Cataracts   PROSTATECTOMY     TEE WITHOUT CARDIOVERSION N/A 02/23/2023   Procedure: TRANSESOPHAGEAL ECHOCARDIOGRAM (TEE);  Surgeon: Emmer Maude BROCKS, MD;  Location: [facility] ENDOSCOPY;  Service: Cardiovascular;  Laterality: N/A;   toe nail reomoval Bilateral    TONSILLECTOMY     TRANSESOPHAGEAL ECHOCARDIOGRAM (CATH LAB) N/A 03/22/2024   Procedure: TRANSESOPHAGEAL ECHOCARDIOGRAM;  Surgeon: Pietro Redell RAMAN, MD;  Location: [facility] INVASIVE CV LAB;  Service: Cardiovascular;  Laterality: N/A;    Allergies  Allergies  Allergen Reactions   Amlodipine  Other (See Comments)    Caused the patient to fall  Other Reaction(s): Not available  amlodipine    Oxycodone Other (See Comments)    Causes extreme dizziness and falling down  Other Reaction(s): Not available  oxycodone   Atorvastatin  Calcium      Other Reaction(s): Not available  atorvastatin  calcium    Atorvastatin  Other (See Comments)    Pt reports causes leg pains and leg heaviness   Latex Rash and Dermatitis    History of Present Illness    Larry Odonnell has a PMH of atrial fibrillation status post ablation 5/24, HTN, HLD, tobacco dependence, coronary artery disease (coronary calcium  score 806 on 05/04/2023 via CTA), aortic atherosclerosis, and pulmonary embolus.  He underwent ablation 5/24. He did not tolerate amiodarone  due to brain fog dizziness and fatigue. MRI of his head 9/24 was negative.  He smokes half pack per day.  On his CT he was noted to have  aortic atherosclerosis and three-vessel coronary disease.  Carotid duplex 9/24 showed some plaque and no stenosis.  TEE 02/23/2023 showed an EF of 60-65% and mild MR.  He was seen in follow-up 02/13/2024 by me  He was maintaining sinus rhythm in the 80s.  He was advised to undergo cardiac PET/CT for evaluation of ischemia due to his elevated calcium  score.  His blood pressure was elevated.  He was advised to continue metoprolol  and lisinopril . Scan not  done till 04/30/24 It showed inferior wall ischemia with normal EF see below   He underwent successful DCCV on 03/22/2024.  He received 1 shock with 200 J and converted to sinus rhythm.  He was continued on Coumadin .  He tolerated the procedure well.  He followed up with Dr. Cindie for 04/04/24.  He was doing well at that time.  He denied recurrent arrhythmia.  Plan for continue to monitor heart rhythms.  In the occurrence of repeat arrhythmia it was felt that he may need repeat catheter ablation due to his prior intolerance of amiodarone   He underwent cardiac catheterization on 06/07/2024.  He was noted to have total occlusion of his mid RCA with left-to-right collateral.  Medical management was recommended.  Today he denies chest pain, shortness of breath, lower extremity edema, palpitations, melena, hematuria, hemoptysis, diaphoresis, weakness, presyncope, syncope, orthopnea, and PND.  Occasional funny feeling in head when walking He is not postural on exam today 140 mmHg systolic sitting and 120 mmHg standing with no dizziness After more prolonged walking his legs give out and he feels lightheaded.   Has had recurrent DVT;s dating back to 2008. Does not mind being on coumadin    Home Kardia readings and HR recordings suggest recurrent PAF HR;s on 1/27 127 range  And normally in 60-70 range ECG today confirms PAF ECG in office today shows afib rate 152      Home Medications    Prior to Admission medications   Medication Sig Start Date End Date Taking? Authorizing Provider  amitriptyline  (ELAVIL ) 50 MG tablet Take 1-2 tablets (50-100 mg total) by mouth 2 (two) times daily as needed (Depression). 05/06/24   Willo Mini, NP  cholecalciferol (VITAMIN D) 25 MCG (1000 UNIT) tablet Take 1,000 Units by mouth daily.    [provider]  clonazePAM  (KLONOPIN ) 0.5 MG tablet Take 1 tablet (0.5 mg total) by mouth daily as needed for anxiety. 02/27/24   Jessup, Joy, NP  Melatonin 10 MG TABS Take 10  mg by mouth at bedtime as needed (Sleep).    [provider]  metoprolol  succinate (TOPROL -XL) 50 MG 24 hr tablet Take 1 tablet (50 mg total) by mouth 2 (two) times daily. Take with or immediately following a meal. 03/11/24   Swinyer, Rosaline HERO, NP  nitroGLYCERIN  (NITROSTAT ) 0.4 MG SL tablet Place 1 tablet (0.4 mg total) under the tongue every 5 (five) minutes as needed for chest pain. 05/03/24   Jasey Cortez C, MD  rosuvastatin  (CRESTOR ) 10 MG tablet Take 1 tablet (10 mg total) by mouth daily. 02/28/24   Trae Bovenzi C, MD  warfarin (COUMADIN ) 5 MG tablet TAKE 1 TABLET BY MOUTH  DAILY AS DIRECTED BY COUMADIN   CLINIC Patient taking differently: Take 5 mg by mouth See admin instructions. Take 5 mg on Tues, Thurs., Sat. and Sun., In the morning DIRECTED BY COUMADIN   CLINIC 01/18/24   Cindie Ole DASEN, MD    Family History    Family History  Problem Relation Age of Onset  Anxiety disorder Mother    Cancer Mother    Stroke Mother    Alcohol abuse Father    Early death Father    Anxiety disorder Brother    Depression Brother    Anxiety disorder Brother    Depression Brother    Colon cancer Maternal Aunt    Anxiety disorder Daughter    Depression Daughter    Anxiety disorder Daughter    Depression Daughter    Esophageal cancer Neg Hx    Rectal cancer Neg Hx    Stomach cancer Neg Hx    He indicated that the status of his mother is unknown. He indicated that his father is alive. He indicated that only one of his two brothers is alive. He indicated that only one of his two daughters is alive. He indicated that the status of his maternal aunt is unknown. He indicated that the status of his neg hx is unknown.  Social History    Social History   Socioeconomic History   Marital status: Married    Spouse name: Larry Odonnell   Number of children: 1   Years of education: 13   Highest education level: Some college, no degree  Occupational History   Occupation: Retired  Tobacco  Use   Smoking status: Every Day    Current packs/day: 0.00    Average packs/day: 1.2 packs/day for 52.4 years (61.4 ttl pk-yrs)    Types: Cigarettes    Start date: 2020    Last attempt to quit: 05/16/2024    Years since quitting: 0.6   Smokeless tobacco: Never   Tobacco comments:    Quit 1990-2015  Vaping Use   Vaping status: Never Used  Substance and Sexual Activity   Alcohol use: Not Currently    Comment: No alcohol since 1995   Drug use: Never   Sexual activity: Not Currently    Birth control/protection: None  Other Topics Concern   Not on file  Social History Narrative   Lives with wife. He enjoys yardwork.   Social Drivers of Health   Tobacco Use: High Risk (01/22/2025)   Patient History    Smoking Tobacco Use: Every Day    Smokeless Tobacco Use: Never    Passive Exposure: Not on file  Financial Resource Strain: Low Risk (11/09/2024)   Overall Financial Resource Strain (CARDIA)    Difficulty of Paying Living Expenses: Not hard at all  Food Insecurity: No Food Insecurity (11/09/2024)   Epic    Worried About Programme Researcher, Broadcasting/film/video in the Last Year: Never true    Ran Out of Food in the Last Year: Never true  Transportation Needs: No Transportation Needs (11/09/2024)   Epic    Lack of Transportation (Medical): No    Lack of Transportation (Non-Medical): No  Physical Activity: Insufficiently Active (11/09/2024)   Exercise Vital Sign    Days of Exercise per Week: 1 day    Minutes of Exercise per Session: 30 min  Stress: No Stress Concern Present (11/09/2024)   [employer] of Occupational Health - Occupational Stress Questionnaire    Feeling of Stress: Not at all  Social Connections: Moderately Isolated (11/09/2024)   Social Connection and Isolation Panel    Frequency of Communication with Friends and Family: Once a week    Frequency of Social Gatherings with Friends and Family: Once a week    Attends Religious Services: More than 4 times per year    Active Member  of Clubs or Organizations: No  Attends Banker Meetings: Not on file    Marital Status: Married  Intimate Partner Violence: Not At Risk (09/05/2024)   Epic    Fear of Current or Ex-Partner: No    Emotionally Abused: No    Physically Abused: No    Sexually Abused: No  Depression (PHQ2-9): Medium Risk (11/12/2024)   Depression (PHQ2-9)    PHQ-2 Score: 5  Alcohol Screen: Low Risk (09/05/2024)   Alcohol Screen    Last Alcohol Screening Score (AUDIT): 0  Housing: Unknown (11/09/2024)   Epic    Unable to Pay for Housing in the Last Year: No    Number of Times Moved in the Last Year: Not on file    Homeless in the Last Year: No  Utilities: Not At Risk (09/05/2024)   Epic    Threatened with loss of utilities: No  Health Literacy: Adequate Health Literacy (09/05/2024)   B1300 Health Literacy    Frequency of need for help with medical instructions: Never     Review of Systems    General:  No chills, fever, night sweats or weight changes.  Cardiovascular:  No chest pain, dyspnea on exertion, edema, orthopnea, palpitations, paroxysmal nocturnal dyspnea. Dermatological: No rash, lesions/masses Respiratory: No cough, dyspnea Urologic: No hematuria, dysuria Abdominal:   No nausea, vomiting, diarrhea, bright red blood per rectum, melena, or hematemesis Neurologic:  No visual changes, wkns, changes in mental status. All other systems reviewed and are otherwise negative except as noted above.  Physical Exam    VS:  BP 130/60   Pulse 78   Ht 6' 1 (1.854 m)   Wt 204 lb 14.4 oz (92.9 kg)   SpO2 94%   BMI 27.03 kg/m  , BMI Body mass index is 27.03 kg/m.  Affect appropriate Healthy:  appears stated age HEENT: normal Neck supple with no adenopathy JVP normal no bruits no thyromegaly Lungs clear with no wheezing and good diaphragmatic motion Heart:  S1/S2 no murmur, no rub, gallop or click PMI normal Abdomen: benighn, BS positve, no tenderness, no AAA no bruit.  No HSM  or HJR Distal pulses intact with no bruits No edema Neuro non-focal Skin warm and dry No muscular weakness   Accessory Clinical Findings    Recent Labs: 05/21/2024: BUN 17; Creatinine, Ser 1.14; Hemoglobin 14.6; Platelets 245; Potassium 5.2; Sodium 140 01/07/2025: ALT 20   Recent Lipid Panel    Component Value Date/Time   CHOL 122 01/07/2025 1006   TRIG 76 01/07/2025 1006   HDL 48 01/07/2025 1006   CHOLHDL 2.5 01/07/2025 1006   LDLCALC 59 01/07/2025 1006         ECG personally reviewed by me today-  afib rate 152 01/22/2025   Echocardiogram 03/22/2024  IMPRESSIONS     1. Left ventricular ejection fraction, by estimation, is 55 to 60%. The  left ventricle has normal function. The left ventricle has no regional  wall motion abnormalities.   2. Right ventricular systolic function is normal. The right ventricular  size is normal.   3. Left atrial size was mildly dilated. No left atrial/left atrial  appendage thrombus was detected.   4. The mitral valve is normal in structure. Mild mitral valve  regurgitation.   5. The aortic valve is tricuspid. Aortic valve regurgitation is not  visualized.   6. There is mild (Grade II) plaque involving the descending aorta.   Conclusion(s)/Recommendation(s): No LA/LAA thrombus identified. Successful  cardioversion performed with restoration of normal sinus rhythm.  FINDINGS   Left Ventricle: Left ventricular ejection fraction, by estimation, is 55  to 60%. The left ventricle has normal function. The left ventricle has no  regional wall motion abnormalities. The left ventricular internal cavity  size was normal in size.   Right Ventricle: The right ventricular size is normal. Right ventricular  systolic function is normal.   Left Atrium: Left atrial size was mildly dilated. No left atrial/left  atrial appendage thrombus was detected.   Right Atrium: Right atrial size was normal in size.   Pericardium: There is no evidence of  pericardial effusion.   Mitral Valve: The mitral valve is normal in structure. Mild mitral valve  regurgitation.   Tricuspid Valve: The tricuspid valve is normal in structure. Tricuspid  valve regurgitation is mild.   Aortic Valve: The aortic valve is tricuspid. Aortic valve regurgitation is  not visualized.   Pulmonic Valve: The pulmonic valve was normal in structure. Pulmonic valve  regurgitation is not visualized.   Aorta: The aortic root is normal in size and structure. There is mild  (Grade II) plaque involving the descending aorta.   IAS/Shunts: No atrial level shunt detected by color flow Doppler.      LHC 06/07/2024    Mid RCA lesion is 100% stenosed.   Mid Cx lesion is 50% stenosed.   1st Diag lesion is 60% stenosed.   Total occlusion of the mid-RCA with left-to-right collateral. This is a small, co-dominant vessel Patent left main with no stenosis Patent LAD with mild diffuse plaquing and no focal high-grade stenoses.  Moderate stenosis of a large diagonal (50-60%) Moderate 50% stenosis of the mid circumflex   Patient with a chronic occlusion of a small codominant RCA and mild to moderate diffuse disease elsewhere.  Recommend aggressive medical therapy with lipid-lowering goal LDL less than 55 mg/dL.  The patient anticoagulated with warfarin and can start back on this tonight.  No indication for PCI or CABG.  Diagnostic Dominance: Co-dominant  Intervention   Assessment & Plan   Coronary artery disease-denies chest pain and anginal type symptoms.  Cath 06/07/24 with CTO RCA with collaterals and moderate LCX/D1 dx. Medical Rx Not on ASA due to age and need for coumadin  Continue metoprolol , rosuvastatin   Hyperlipidemia-LDL 59 Crestor  to 40 mg   Atrial fibrillation-Hr today 80 bpm in NSR .  Compliant with Coumadin .  Denies bleeding issues. Continue metoprolol , Coumadin  Having recurrence with symptoms ? Related to post conversion pauses 14 day monitor  Refer  back to EP for Tikosyn vs repeat ablation He prefers ablation and not 3 day hospital stay for Tikosyn  Essential hypertension -BP today perfect  Maintain blood pressure log at home normal systolics not < 100 mmHg and not  Postural in clinic  Heart healthy low-sodium diet Continue metoprolol   GERD: Suggested Pepcid complete and low carb diet  14 day monitor  Disposition: Follow-up in 6 months with me and EP ASAP  Maude Emmer MD [facility]  "

## 2025-01-13 ENCOUNTER — Ambulatory Visit: Admitting: Cardiovascular Disease

## 2025-01-14 ENCOUNTER — Ambulatory Visit: Admitting: *Deleted

## 2025-01-14 DIAGNOSIS — I824Y9 Acute embolism and thrombosis of unspecified deep veins of unspecified proximal lower extremity: Secondary | ICD-10-CM | POA: Diagnosis not present

## 2025-01-14 DIAGNOSIS — I2693 Single subsegmental pulmonary embolism without acute cor pulmonale: Secondary | ICD-10-CM

## 2025-01-14 DIAGNOSIS — I4892 Unspecified atrial flutter: Secondary | ICD-10-CM

## 2025-01-14 DIAGNOSIS — I4819 Other persistent atrial fibrillation: Secondary | ICD-10-CM | POA: Diagnosis not present

## 2025-01-14 LAB — POCT INR: INR: 3.1 — AB (ref 2.0–3.0)

## 2025-01-14 NOTE — Patient Instructions (Signed)
 Description   INR-3.1; Since you took a half tablet this morning do not take anymore warfarin today then continue taking warfarin 5mg  daily except for 7.5mg  on Mondays.  Recheck INR in 1 week.  Anticoagulation Clinic [phone]

## 2025-01-14 NOTE — Progress Notes (Signed)
 Description   INR-3.1; Since you took a half tablet this morning do not take anymore warfarin today then continue taking warfarin 5mg  daily except for 7.5mg  on Mondays.  Recheck INR in 1 week.  Anticoagulation Clinic [phone]

## 2025-01-22 ENCOUNTER — Ambulatory Visit

## 2025-01-22 ENCOUNTER — Encounter: Payer: Self-pay | Admitting: Cardiovascular Disease

## 2025-01-22 ENCOUNTER — Ambulatory Visit: Admitting: *Deleted

## 2025-01-22 ENCOUNTER — Ambulatory Visit: Admitting: Cardiovascular Disease

## 2025-01-22 VITALS — BP 130/60 | HR 78 | Ht 73.0 in | Wt 204.9 lb

## 2025-01-22 DIAGNOSIS — I824Y9 Acute embolism and thrombosis of unspecified deep veins of unspecified proximal lower extremity: Secondary | ICD-10-CM

## 2025-01-22 DIAGNOSIS — I2693 Single subsegmental pulmonary embolism without acute cor pulmonale: Secondary | ICD-10-CM

## 2025-01-22 DIAGNOSIS — I4891 Unspecified atrial fibrillation: Secondary | ICD-10-CM | POA: Diagnosis not present

## 2025-01-22 DIAGNOSIS — I4892 Unspecified atrial flutter: Secondary | ICD-10-CM | POA: Diagnosis not present

## 2025-01-22 DIAGNOSIS — I4819 Other persistent atrial fibrillation: Secondary | ICD-10-CM

## 2025-01-22 LAB — POCT INR: INR: 2.8 (ref 2.0–3.0)

## 2025-01-22 NOTE — Progress Notes (Unsigned)
 Enrolled for [employer] to mail a ZIO XT long term holter monitor to the patients address on file.

## 2025-01-22 NOTE — Progress Notes (Signed)
 Description   INR-2.8; Continue taking warfarin 5mg  daily except for 7.5mg  on Mondays.  Recheck INR in 2 weeks with MD appt Anticoagulation Clinic [phone]

## 2025-01-22 NOTE — Patient Instructions (Signed)
 Description   INR-2.8; Continue taking warfarin 5mg  daily except for 7.5mg  on Mondays.  Recheck INR in 2 weeks with MD appt Anticoagulation Clinic [phone]

## 2025-01-22 NOTE — Patient Instructions (Signed)
 Medication Instructions:  Continue same medications *If you need a refill on your cardiac medications before your next appointment, please call your pharmacy*  Lab Work: Continue same medications  Testing/Procedures: 14 day Hear Monitor  will be mailed to your home with instructions   Follow-Up: At [facility], you and your health needs are our priority.  As part of our continuing mission to provide you with exceptional heart care, our providers are all part of one team.  This team includes your primary Cardiologist (physician) and Advanced Practice Providers or APPs (Physician Assistants and Nurse Practitioners) who all work together to provide you with the care you need, when you need it.  Your next appointment:  To Be Determined    Provider:  Dr.Nishan             Scheduler will call back wit appointment with Dr.Parker or Dr.Carlisle       We recommend signing up for the patient portal called MyChart.  Sign up information is provided on this After Visit Summary.  MyChart is used to connect with patients for Virtual Visits (Telemedicine).  Patients are able to view lab/test results, encounter notes, upcoming appointments, etc.  Non-urgent messages can be sent to your provider as well.   To learn more about what you can do with MyChart, go to [email or web address].

## 2025-02-06 ENCOUNTER — Ambulatory Visit: Admitting: Student in an Organized Health Care Education/Training Program

## 2025-02-06 ENCOUNTER — Ambulatory Visit

## 2025-02-12 ENCOUNTER — Ambulatory Visit

## 2025-05-12 ENCOUNTER — Ambulatory Visit: Admitting: Medical-Surgical

## 2025-09-10 ENCOUNTER — Ambulatory Visit
